# Patient Record
Sex: Female | Born: 1950
Health system: Southern US, Community
[De-identification: ages and names within clinical notes are randomized; demographics above are authoritative.]

## PROBLEM LIST (undated history)

## (undated) DIAGNOSIS — K529 Noninfective gastroenteritis and colitis, unspecified: Secondary | ICD-10-CM

## (undated) DIAGNOSIS — I499 Cardiac arrhythmia, unspecified: Secondary | ICD-10-CM

## (undated) DIAGNOSIS — Z720 Tobacco use: Secondary | ICD-10-CM

## (undated) DIAGNOSIS — I6523 Occlusion and stenosis of bilateral carotid arteries: Secondary | ICD-10-CM

## (undated) DIAGNOSIS — M1990 Unspecified osteoarthritis, unspecified site: Secondary | ICD-10-CM

## (undated) DIAGNOSIS — I351 Nonrheumatic aortic (valve) insufficiency: Secondary | ICD-10-CM

## (undated) DIAGNOSIS — R55 Syncope and collapse: Secondary | ICD-10-CM

## (undated) DIAGNOSIS — D649 Anemia, unspecified: Secondary | ICD-10-CM

## (undated) DIAGNOSIS — I4891 Unspecified atrial fibrillation: Secondary | ICD-10-CM

## (undated) DIAGNOSIS — F419 Anxiety disorder, unspecified: Secondary | ICD-10-CM

## (undated) DIAGNOSIS — R011 Cardiac murmur, unspecified: Secondary | ICD-10-CM

## (undated) DIAGNOSIS — F329 Major depressive disorder, single episode, unspecified: Secondary | ICD-10-CM

## (undated) DIAGNOSIS — G4733 Obstructive sleep apnea (adult) (pediatric): Secondary | ICD-10-CM

## (undated) DIAGNOSIS — E7800 Pure hypercholesterolemia, unspecified: Secondary | ICD-10-CM

## (undated) DIAGNOSIS — B1920 Unspecified viral hepatitis C without hepatic coma: Secondary | ICD-10-CM

## (undated) DIAGNOSIS — J302 Other seasonal allergic rhinitis: Secondary | ICD-10-CM

## (undated) DIAGNOSIS — Z8619 Personal history of other infectious and parasitic diseases: Secondary | ICD-10-CM

## (undated) DIAGNOSIS — G4710 Hypersomnia, unspecified: Secondary | ICD-10-CM

## (undated) DIAGNOSIS — F32A Depression, unspecified: Secondary | ICD-10-CM

## (undated) DIAGNOSIS — I10 Essential (primary) hypertension: Secondary | ICD-10-CM

## (undated) DIAGNOSIS — E559 Vitamin D deficiency, unspecified: Secondary | ICD-10-CM

## (undated) DIAGNOSIS — M25511 Pain in right shoulder: Secondary | ICD-10-CM

## (undated) HISTORY — DX: Essential (primary) hypertension: I10

## (undated) HISTORY — DX: Unspecified viral hepatitis C without hepatic coma: B19.20

## (undated) HISTORY — DX: Nonrheumatic aortic (valve) insufficiency: I35.1

## (undated) HISTORY — DX: Other seasonal allergic rhinitis: J30.2

## (undated) HISTORY — DX: Personal history of other infectious and parasitic diseases: Z86.19

## (undated) HISTORY — DX: Pure hypercholesterolemia, unspecified: E78.00

## (undated) HISTORY — DX: Pain in right shoulder: M25.511

## (undated) HISTORY — DX: Hypersomnia, unspecified: G47.10

## (undated) HISTORY — DX: Vitamin D deficiency, unspecified: E55.9

## (undated) HISTORY — DX: Tobacco use: Z72.0

## (undated) HISTORY — DX: Obstructive sleep apnea (adult) (pediatric): G47.33

## (undated) HISTORY — DX: Occlusion and stenosis of bilateral carotid arteries: I65.23

## (undated) HISTORY — DX: Anxiety disorder, unspecified: F41.9

## (undated) HISTORY — DX: Noninfective gastroenteritis and colitis, unspecified: K52.9

## (undated) HISTORY — DX: Unspecified atrial fibrillation: I48.91

## (undated) HISTORY — DX: Major depressive disorder, single episode, unspecified: F32.9

## (undated) HISTORY — DX: Syncope and collapse: R55

## (undated) HISTORY — DX: Depression, unspecified: F32.A

---

## 1998-09-06 ENCOUNTER — Ambulatory Visit (HOSPITAL_COMMUNITY): Admission: RE | Admit: 1998-09-06 | Discharge: 1998-09-06 | Payer: Self-pay | Admitting: Family Medicine

## 1999-10-07 HISTORY — PX: BREAST REDUCTION SURGERY: SHX8

## 1999-11-01 ENCOUNTER — Encounter: Admission: RE | Admit: 1999-11-01 | Discharge: 1999-11-01 | Payer: Self-pay | Admitting: Specialist

## 1999-11-01 ENCOUNTER — Encounter: Payer: Self-pay | Admitting: Specialist

## 1999-11-04 ENCOUNTER — Ambulatory Visit (HOSPITAL_BASED_OUTPATIENT_CLINIC_OR_DEPARTMENT_OTHER): Admission: RE | Admit: 1999-11-04 | Discharge: 1999-11-05 | Payer: Self-pay | Admitting: Specialist

## 2000-09-10 ENCOUNTER — Other Ambulatory Visit: Admission: RE | Admit: 2000-09-10 | Discharge: 2000-09-10 | Payer: Self-pay | Admitting: Family Medicine

## 2001-12-02 ENCOUNTER — Other Ambulatory Visit: Admission: RE | Admit: 2001-12-02 | Discharge: 2001-12-02 | Payer: Self-pay | Admitting: Family Medicine

## 2001-12-09 ENCOUNTER — Encounter: Payer: Self-pay | Admitting: Family Medicine

## 2001-12-09 ENCOUNTER — Encounter: Admission: RE | Admit: 2001-12-09 | Discharge: 2001-12-09 | Payer: Self-pay | Admitting: Family Medicine

## 2002-12-23 ENCOUNTER — Encounter: Payer: Self-pay | Admitting: Family Medicine

## 2002-12-23 ENCOUNTER — Encounter: Admission: RE | Admit: 2002-12-23 | Discharge: 2002-12-23 | Payer: Self-pay | Admitting: Family Medicine

## 2003-02-02 ENCOUNTER — Other Ambulatory Visit: Admission: RE | Admit: 2003-02-02 | Discharge: 2003-02-02 | Payer: Self-pay | Admitting: Family Medicine

## 2004-04-12 ENCOUNTER — Other Ambulatory Visit: Admission: RE | Admit: 2004-04-12 | Discharge: 2004-04-12 | Payer: Self-pay | Admitting: Family Medicine

## 2005-06-17 ENCOUNTER — Encounter: Admission: RE | Admit: 2005-06-17 | Discharge: 2005-06-17 | Payer: Self-pay | Admitting: Family Medicine

## 2005-07-17 ENCOUNTER — Other Ambulatory Visit: Admission: RE | Admit: 2005-07-17 | Discharge: 2005-07-17 | Payer: Self-pay | Admitting: Family Medicine

## 2005-08-07 ENCOUNTER — Encounter: Admission: RE | Admit: 2005-08-07 | Discharge: 2005-08-07 | Payer: Self-pay | Admitting: Family Medicine

## 2006-09-17 ENCOUNTER — Other Ambulatory Visit: Admission: RE | Admit: 2006-09-17 | Discharge: 2006-09-17 | Payer: Self-pay | Admitting: Family Medicine

## 2006-10-08 ENCOUNTER — Encounter: Admission: RE | Admit: 2006-10-08 | Discharge: 2006-10-08 | Payer: Self-pay | Admitting: Family Medicine

## 2007-12-24 ENCOUNTER — Other Ambulatory Visit: Admission: RE | Admit: 2007-12-24 | Discharge: 2007-12-24 | Payer: Self-pay | Admitting: Family Medicine

## 2008-02-17 ENCOUNTER — Ambulatory Visit: Payer: Self-pay | Admitting: Gastroenterology

## 2008-03-02 ENCOUNTER — Ambulatory Visit: Payer: Self-pay | Admitting: Gastroenterology

## 2008-03-02 ENCOUNTER — Encounter: Payer: Self-pay | Admitting: Gastroenterology

## 2008-09-14 ENCOUNTER — Encounter: Admission: RE | Admit: 2008-09-14 | Discharge: 2008-09-14 | Payer: Self-pay | Admitting: Family Medicine

## 2009-03-22 ENCOUNTER — Other Ambulatory Visit: Admission: RE | Admit: 2009-03-22 | Discharge: 2009-03-22 | Payer: Self-pay | Admitting: Family Medicine

## 2010-10-02 ENCOUNTER — Emergency Department (HOSPITAL_COMMUNITY): Admission: EM | Admit: 2010-10-02 | Discharge: 2010-10-03 | Payer: Self-pay | Admitting: Emergency Medicine

## 2011-01-18 LAB — RAPID URINE DRUG SCREEN, HOSP PERFORMED
Amphetamines: NOT DETECTED
Barbiturates: NOT DETECTED
Benzodiazepines: NOT DETECTED
Cocaine: NOT DETECTED
Tetrahydrocannabinol: NOT DETECTED

## 2011-01-18 LAB — SALICYLATE LEVEL: Salicylate Lvl: <4 mg/dL (ref 2.8–20.0)

## 2011-01-18 LAB — ETHANOL: Alcohol, Ethyl (B): 141 mg/dL — ABNORMAL HIGH (ref 0–10)

## 2011-01-18 LAB — COMPREHENSIVE METABOLIC PANEL
Albumin: 3.6 g/dL (ref 3.5–5.2)
BUN: 9 mg/dL (ref 6–23)
Sodium: 142 mEq/L (ref 135–145)

## 2011-01-18 LAB — TRICYCLICS SCREEN, URINE: TCA Scrn: NOT DETECTED

## 2011-01-18 LAB — CBC
Hemoglobin: 12.7 g/dL (ref 12.0–15.0)
MCH: 34 pg (ref 26.0–34.0)
WBC: 7.6 10*3/uL (ref 4.0–10.5)

## 2011-03-24 NOTE — Op Note (Signed)
[facility]  Patient:    Theresa Reynolds                       MRN: [id number] Proc. Date: 11/04/99 Adm. Date:  [id number] Attending:  Gustavus Messing CC:         Yaakov Guthrie. Shon Hough, M.D. (2 copies)                           Operative Report  BRIEF HISTORY:  This is a 60 year old lady with severe macromastia and back and  shoulder pain secondary to a large pendulous breasts, and has accessory breast tissue on the latissimus dorsi axillary areas as well.  PROCEDURES DONE:  Bilateral breast reductions using the inferior pedicle technique and excision of accessory breast tissue.  SURGEON:  Yaakov Guthrie. Shon Hough, M.D.  ANESTHESIA:  General.  DESCRIPTION OF PROCEDURE:  Preoperatively, the patient had been drawn for the inferior pedicle reduction mammoplasty remarking of the nipple areolar complexes to 20 cm from the suprasternal notch.  After this, she underwent general anesthesia, intubated orally, and prep was done to the chest and breast areas in a routine fashion using Betadine soap and solution and walled off with sterile towels and  draped as so as to make a sterile field.  The tissues were injected with 0.25% Xylocaine with epinephrine 1:400,000 concentration; total of 100 cc per side. he wounds were scored with a #15 blade, and the skin over the inferior pedicle was  de-epithelialized with a #20 blade.  After this, the medial and lateral fatty dermal pedicles were excised down to the underlying pectoralis major fascia. Hemostasis was maintained with the Bovie unit and coagulation.  After this, out  laterally, more tissue was removed using sharp dissection with the Bovie unit, removing accessory breast tissue in the regions.  The new keyhole area was debulked.  The fascia was then transposed and stayed with 3-0 Prolene. Subcutaneous closure was done with 3-0 Monocryl x 2 layers, and then the running subcuticular stitch of 3-0  Monocryl and 5-0 Monocryl throughout the inverted T. The wounds were drained with a #10 Blake drain, fully ______ , which was placed in he depths of the wound, brought out through the lateral-most portion of the incisions, and secured with 3-0 Prolene.  The wound were cleansed.  Steri-Strips and soft dressings were applied to all the areas.  She withstood the procedure very well and was taken to the recovery room in good condition.  We removed over 500 gm from his lady who was only approximately 5 feet 1 inches. DD:  11/04/99 TD:  11/05/99 Job: [id number]

## 2011-05-31 ENCOUNTER — Emergency Department (HOSPITAL_COMMUNITY)
Admission: EM | Admit: 2011-05-31 | Discharge: 2011-05-31 | Disposition: A | Discharge status: Home / Self Care | Payer: BC Managed Care – PPO | Attending: Emergency Medicine | Admitting: Emergency Medicine

## 2011-05-31 DIAGNOSIS — Z8619 Personal history of other infectious and parasitic diseases: Secondary | ICD-10-CM | POA: Insufficient documentation

## 2011-05-31 DIAGNOSIS — I10 Essential (primary) hypertension: Secondary | ICD-10-CM | POA: Insufficient documentation

## 2011-05-31 DIAGNOSIS — Z046 Encounter for general psychiatric examination, requested by authority: Secondary | ICD-10-CM | POA: Insufficient documentation

## 2011-05-31 DIAGNOSIS — IMO0002 Reserved for concepts with insufficient information to code with codable children: Secondary | ICD-10-CM | POA: Insufficient documentation

## 2011-05-31 LAB — BASIC METABOLIC PANEL
BUN: 16 mg/dL (ref 6–23)
CO2: 25 mEq/L (ref 19–32)
Chloride: 98 mEq/L (ref 96–112)
Glucose, Bld: 93 mg/dL (ref 70–99)
Potassium: 4 mEq/L (ref 3.5–5.1)
Sodium: 134 mEq/L — ABNORMAL LOW (ref 135–145)

## 2011-05-31 LAB — DIFFERENTIAL
Basophils Absolute: 0 10*3/uL (ref 0.0–0.1)
Basophils Relative: 0 % (ref 0–1)
Lymphocytes Relative: 34 % (ref 12–46)
Monocytes Absolute: 0.5 10*3/uL (ref 0.1–1.0)
Neutrophils Relative %: 58 % (ref 43–77)

## 2011-05-31 LAB — ETHANOL: Alcohol, Ethyl (B): 143 mg/dL — ABNORMAL HIGH (ref 0–11)

## 2011-05-31 LAB — RAPID URINE DRUG SCREEN, HOSP PERFORMED: Benzodiazepines: NOT DETECTED

## 2011-05-31 LAB — CBC
Platelets: 339 10*3/uL (ref 150–400)
RDW: 13.2 % (ref 11.5–15.5)
WBC: 10.7 10*3/uL — ABNORMAL HIGH (ref 4.0–10.5)

## 2011-07-24 ENCOUNTER — Other Ambulatory Visit: Payer: Self-pay | Admitting: Family Medicine

## 2011-07-24 ENCOUNTER — Other Ambulatory Visit (HOSPITAL_COMMUNITY)
Admission: RE | Admit: 2011-07-24 | Discharge: 2011-07-24 | Disposition: A | Discharge status: Home / Self Care | Payer: BC Managed Care – PPO | Source: Ambulatory Visit | Attending: Family Medicine | Admitting: Family Medicine

## 2011-07-24 DIAGNOSIS — Z124 Encounter for screening for malignant neoplasm of cervix: Secondary | ICD-10-CM | POA: Insufficient documentation

## 2011-07-24 DIAGNOSIS — Z1159 Encounter for screening for other viral diseases: Secondary | ICD-10-CM | POA: Insufficient documentation

## 2014-12-27 ENCOUNTER — Ambulatory Visit (INDEPENDENT_AMBULATORY_CARE_PROVIDER_SITE_OTHER): Payer: BLUE CROSS/BLUE SHIELD

## 2014-12-27 ENCOUNTER — Ambulatory Visit (INDEPENDENT_AMBULATORY_CARE_PROVIDER_SITE_OTHER): Payer: BLUE CROSS/BLUE SHIELD | Admitting: Emergency Medicine

## 2014-12-27 VITALS — BP 130/86 | HR 145 | Temp 98.7°F | Resp 18 | Ht 62.0 in | Wt 169.0 lb

## 2014-12-27 DIAGNOSIS — R002 Palpitations: Secondary | ICD-10-CM

## 2014-12-27 DIAGNOSIS — M542 Cervicalgia: Secondary | ICD-10-CM

## 2014-12-27 LAB — TSH: TSH: 3.098 u[IU]/mL (ref 0.350–4.500)

## 2014-12-27 LAB — POCT CBC
Granulocyte percent: 72.4 %G (ref 37–80)
HEMATOCRIT: 42.1 % (ref 37.7–47.9)
Hemoglobin: 14.1 g/dL (ref 12.2–16.2)
Lymph, poc: 2.7 (ref 0.6–3.4)
MCH, POC: 33.5 pg — AB (ref 27–31.2)
MCHC: 33.4 g/dL (ref 31.8–35.4)
MCV: 100.4 fL — AB (ref 80–97)
MID (cbc): 0.2 (ref 0–0.9)
MPV: 8.3 fL (ref 0–99.8)
PLATELET COUNT, POC: 293 10*3/uL (ref 142–424)
POC GRANULOCYTE: 7.6 — AB (ref 2–6.9)
POC LYMPH %: 25.9 % (ref 10–50)
POC MID %: 1.7 % (ref 0–12)
RBC: 4.2 M/uL (ref 4.04–5.48)
RDW, POC: 13.4 %
WBC: 10.5 10*3/uL — AB (ref 4.6–10.2)

## 2014-12-27 LAB — T4, FREE: Free T4: 0.84 ng/dL (ref 0.80–1.80)

## 2014-12-27 MED ORDER — HYDROCODONE-ACETAMINOPHEN 5-325 MG PO TABS: 1.0000 | ORAL_TABLET | Freq: Four times a day (QID) | ORAL | Status: DC | PRN

## 2014-12-27 MED ORDER — PREDNISONE 20 MG PO TABS: ORAL_TABLET | ORAL | Status: DC

## 2014-12-27 NOTE — Progress Notes (Addendum)
   This chart was scribed for Darlyne Russian, MD by Edison Simon, ED Scribe. This patient was seen in room 14 and the patient's care was started at 9:01 AM.   Subjective:    Patient ID: [name], female    DOB: October 18, 1951, 64 y.o.   MRN: [id number]  HPI  HPI Comments: FREDERIKA HUKILL is a 64 y.o. female who presents to the [facility] complaining of neck pain radiating to left shoulder and at times to her back. She has worked doing hair for 43 years and states she has had this problem intermittently but resolved spontaneously, but states it has been persistent this time for over 1 month. She also notes history of bursitis and bone spurs in shoulder. She states pain makes it difficult to sleep and move her neck. She states she has not had prior imaging of her neck. She states she sometimes feels her heart beat quickly. She reports recent stress from her brother in [place] being hospitalized for atrial fibrillation. She denies known history of thyroid problems. She states she has used icy hot and kinesiology tape without remission.   Review of Systems  Cardiovascular: Positive for palpitations.  Musculoskeletal: Positive for back pain and neck pain.  Psychiatric/Behavioral: Positive for sleep disturbance.  All other systems reviewed and are negative.      Objective:   Physical Exam  Nursing note and vitals reviewed.  CONSTITUTIONAL: Well developed/well nourished HEAD: Normocephalic/atraumatic EYES: EOMI/PERRL ENMT: Mucous membranes moist NECK: supple no meningeal signs, limited ROM to the right and to the left, pain with extension of her neck, positive Spurlings SPINE/BACK:entire spine nontender CV: S1/S2 noted, no murmurs/rubs/gallops noted, tachycardic LUNGS: Lungs are clear to auscultation bilaterally, no apparent distress ABDOMEN: soft, nontender, no rebound or guarding, bowel sounds noted throughout abdomen GU:no cva tenderness NEURO: Pt is  awake/alert/appropriate, moves all extremitiesx4.  No facial droop.   EXTREMITIES: pulses normal/equal, full ROM, reflexes and strength of upper extremities are normal SKIN: warm, color normal PSYCH: no abnormalities of mood noted, alert and oriented to situation   [facility] (PRIMARY) x-ray report read by Dr. Everlene Farrier: cervical spine x-ray: she has severe multilevel degenerative disc disease, there is a large spur at C4, C5 vertebrae appears sclerotic with partial collapse, degenerative disc disease at C5 C6 C7 , there appears to be some retrolisthesis of C5     Assessment & Plan:  Patient placed on prednisone in a taper dose along with hydrocodone for severe pain. t. Will schedule MRI of the C-spine. She initially was tachycardic but at the time of her EKG her heart rate was down to 82 at the time of her EKG. Thyroid studies were drawn. I personally performed the services described in this documentation, which was scribed in my presence. The recorded information has been reviewed and is accurate.

## 2015-01-04 ENCOUNTER — Telehealth: Payer: Self-pay

## 2015-01-04 ENCOUNTER — Other Ambulatory Visit: Payer: Self-pay | Admitting: Emergency Medicine

## 2015-01-04 MED ORDER — METHOCARBAMOL 500 MG PO TABS: 500.0000 mg | ORAL_TABLET | Freq: Four times a day (QID) | ORAL | Status: DC | PRN

## 2015-01-04 MED ORDER — HYDROCODONE-ACETAMINOPHEN 5-325 MG PO TABS: 1.0000 | ORAL_TABLET | Freq: Four times a day (QID) | ORAL | Status: DC | PRN

## 2015-01-04 NOTE — Telephone Encounter (Signed)
Muscle in neck pain won't let up.  Muscle relaxer might work.   [phone]

## 2015-01-04 NOTE — Telephone Encounter (Signed)
Pt called for her HYDROcodone-acetaminophen ([place]) 5-325 MG per tablet [[id number]] prescription. I found it in the stack to be called and signed in. I signed it in and placed it in the drawer.

## 2015-01-04 NOTE — Telephone Encounter (Signed)
Dr. Daub please advise. 

## 2015-01-04 NOTE — Telephone Encounter (Signed)
I sent meds in. Will try and get MRI sooner

## 2015-01-04 NOTE — Telephone Encounter (Signed)
Pt is getting a referral regarding a nerve pain she have but she is in a lot of pain now, would like to have some PREDISIONE AND HYDROCODONE Please call [phone]     [facility]

## 2015-01-05 NOTE — Telephone Encounter (Signed)
Left a detailed message letting pt know Rx is ready to pick up.

## 2015-01-18 ENCOUNTER — Ambulatory Visit
Admission: RE | Admit: 2015-01-18 | Discharge: 2015-01-18 | Disposition: A | Discharge status: Home / Self Care | Payer: BLUE CROSS/BLUE SHIELD | Source: Ambulatory Visit | Attending: Emergency Medicine | Admitting: Emergency Medicine

## 2015-01-18 DIAGNOSIS — M542 Cervicalgia: Secondary | ICD-10-CM

## 2015-01-19 ENCOUNTER — Telehealth: Payer: Self-pay | Admitting: Emergency Medicine

## 2015-01-19 ENCOUNTER — Other Ambulatory Visit: Payer: Self-pay

## 2015-01-19 ENCOUNTER — Telehealth: Payer: Self-pay

## 2015-01-19 DIAGNOSIS — R9389 Abnormal findings on diagnostic imaging of other specified body structures: Secondary | ICD-10-CM

## 2015-01-19 NOTE — Telephone Encounter (Signed)
Spoke to patient and advised her that Dr. Everlene Farrier has put in orders for blood work and referrals for neurosurgery and an MRI.  I told her we would call her with the appointments.  She said she would come in for the blood work as soon as possible.

## 2015-01-19 NOTE — Telephone Encounter (Signed)
I called and spoke with the patient. I told her I would speak with the neurosurgeon regarding next step.

## 2015-01-20 ENCOUNTER — Other Ambulatory Visit (INDEPENDENT_AMBULATORY_CARE_PROVIDER_SITE_OTHER): Payer: BLUE CROSS/BLUE SHIELD | Admitting: *Deleted

## 2015-01-20 DIAGNOSIS — R938 Abnormal findings on diagnostic imaging of other specified body structures: Secondary | ICD-10-CM | POA: Diagnosis not present

## 2015-01-20 DIAGNOSIS — R9389 Abnormal findings on diagnostic imaging of other specified body structures: Secondary | ICD-10-CM

## 2015-01-20 LAB — BASIC METABOLIC PANEL
BUN: 13 mg/dL (ref 6–23)
CO2: 23 meq/L (ref 19–32)
Calcium: 9.1 mg/dL (ref 8.4–10.5)
Chloride: 103 mEq/L (ref 96–112)
Creat: 0.75 mg/dL (ref 0.50–1.10)
GLUCOSE: 82 mg/dL (ref 70–99)
POTASSIUM: 4.8 meq/L (ref 3.5–5.3)
Sodium: 135 mEq/L (ref 135–145)

## 2015-01-20 LAB — CBC WITH DIFFERENTIAL/PLATELET
Basophils Absolute: 0 10*3/uL (ref 0.0–0.1)
Basophils Relative: 0 % (ref 0–1)
Eosinophils Absolute: 0.1 10*3/uL (ref 0.0–0.7)
Eosinophils Relative: 1 % (ref 0–5)
HCT: 37.3 % (ref 36.0–46.0)
Hemoglobin: 12.7 g/dL (ref 12.0–15.0)
Lymphocytes Relative: 31 % (ref 12–46)
Lymphs Abs: 2.6 10*3/uL (ref 0.7–4.0)
MCH: 34.1 pg — ABNORMAL HIGH (ref 26.0–34.0)
MCHC: 34 g/dL (ref 30.0–36.0)
MCV: 100.3 fL — ABNORMAL HIGH (ref 78.0–100.0)
MPV: 10.5 fL (ref 8.6–12.4)
Monocytes Absolute: 0.5 10*3/uL (ref 0.1–1.0)
Monocytes Relative: 6 % (ref 3–12)
Neutro Abs: 5.1 10*3/uL (ref 1.7–7.7)
Neutrophils Relative %: 62 % (ref 43–77)
Platelets: 341 10*3/uL (ref 150–400)
RBC: 3.72 MIL/uL — ABNORMAL LOW (ref 3.87–5.11)
RDW: 13.4 % (ref 11.5–15.5)
WBC: 8.3 10*3/uL (ref 4.0–10.5)

## 2015-01-20 LAB — C-REACTIVE PROTEIN

## 2015-01-20 NOTE — Progress Notes (Signed)
Pt here for lab draw only  

## 2015-01-21 ENCOUNTER — Encounter: Payer: Self-pay | Admitting: Family Medicine

## 2015-01-21 LAB — SEDIMENTATION RATE: Sed Rate: 8 mm/hr (ref 0–30)

## 2015-01-24 ENCOUNTER — Ambulatory Visit
Admission: RE | Admit: 2015-01-24 | Discharge: 2015-01-24 | Disposition: A | Discharge status: Home / Self Care | Payer: BLUE CROSS/BLUE SHIELD | Source: Ambulatory Visit | Attending: Emergency Medicine | Admitting: Emergency Medicine

## 2015-01-24 DIAGNOSIS — R9389 Abnormal findings on diagnostic imaging of other specified body structures: Secondary | ICD-10-CM

## 2015-01-25 NOTE — Telephone Encounter (Signed)
Dr. Everlene Farrier wanted me to call pt to see if pt had heard from neurosurgery and check to see how she is doing.  Pt was supposed to have an MRI yesterday with contrast, but they were unable to get a vein for the contrast. Pt has rescheduled MRI for tomorrow.  Pt's appt with neurosurgery is Tues April 12 Pt doing ok with muscle relaxants at night-nerve pain not nearly as bad taking these

## 2015-01-26 ENCOUNTER — Other Ambulatory Visit: Payer: Self-pay | Admitting: Emergency Medicine

## 2015-01-26 ENCOUNTER — Telehealth: Payer: Self-pay

## 2015-01-26 ENCOUNTER — Ambulatory Visit
Admission: RE | Admit: 2015-01-26 | Discharge: 2015-01-26 | Disposition: A | Discharge status: Home / Self Care | Payer: BLUE CROSS/BLUE SHIELD | Source: Ambulatory Visit | Attending: Emergency Medicine | Admitting: Emergency Medicine

## 2015-01-26 MED ORDER — GADOBENATE DIMEGLUMINE 529 MG/ML IV SOLN: 15.0000 mL | Freq: Once | INTRAVENOUS | Status: AC | PRN

## 2015-01-26 MED ORDER — HYDROCODONE-ACETAMINOPHEN 5-325 MG PO TABS: 1.0000 | ORAL_TABLET | Freq: Four times a day (QID) | ORAL | Status: DC | PRN

## 2015-01-26 NOTE — Telephone Encounter (Signed)
Pt states she hurt her neck and would like to have her HYDROCODONE refilled. Please call [phone]

## 2015-01-26 NOTE — Telephone Encounter (Signed)
Left a detailed message on voicemail giving message from Dr. Everlene Farrier. Advised pt to call if she had any questions.

## 2015-01-26 NOTE — Telephone Encounter (Signed)
Call patient and tell her I'm glad she is not suffering as bad as previously. Once we get the MRI we will know what direction to go.

## 2015-01-28 NOTE — Telephone Encounter (Signed)
Dr Everlene Farrier RFd on 3/22 and notified pt on Mychart.

## 2015-10-27 ENCOUNTER — Encounter: Payer: Self-pay | Admitting: Gastroenterology

## 2017-04-03 ENCOUNTER — Telehealth: Payer: Self-pay | Admitting: *Deleted

## 2017-04-03 NOTE — Telephone Encounter (Signed)
NOTES SENT TO SCHEDULING.  °

## 2017-04-25 ENCOUNTER — Ambulatory Visit: Payer: BLUE CROSS/BLUE SHIELD | Admitting: Cardiovascular Disease

## 2017-04-30 ENCOUNTER — Ambulatory Visit (INDEPENDENT_AMBULATORY_CARE_PROVIDER_SITE_OTHER): Payer: BLUE CROSS/BLUE SHIELD | Admitting: Internal Medicine

## 2017-04-30 ENCOUNTER — Encounter: Payer: Self-pay | Admitting: Internal Medicine

## 2017-04-30 VITALS — BP 122/78 | HR 64 | Ht 61.0 in | Wt 176.0 lb

## 2017-04-30 DIAGNOSIS — R55 Syncope and collapse: Secondary | ICD-10-CM | POA: Diagnosis not present

## 2017-04-30 DIAGNOSIS — G4733 Obstructive sleep apnea (adult) (pediatric): Secondary | ICD-10-CM | POA: Diagnosis not present

## 2017-04-30 DIAGNOSIS — I480 Paroxysmal atrial fibrillation: Secondary | ICD-10-CM | POA: Diagnosis not present

## 2017-04-30 DIAGNOSIS — I10 Essential (primary) hypertension: Secondary | ICD-10-CM

## 2017-04-30 LAB — TSH: TSH: 3.65 u[IU]/mL (ref 0.450–4.500)

## 2017-04-30 LAB — T4: T4, Total: 5.8 ug/dL (ref 4.5–12.0)

## 2017-04-30 NOTE — Patient Instructions (Addendum)
Medication Instructions:  Your physician recommends that you continue on your current medications as directed. Please refer to the Current Medication list given to you today.  Labwork: Your physician recommends that you get TSH and T4 today.   Testing/Procedures: LINQ implant on 05/07/2017  Please arrive @ [place] main entrance of [place] @ 6:30am  Follow-Up: Your physician recommends that you schedule a follow-up appointment in: 7 to 10 days from 05/07/2017 in device clinic for wound check.  Follow up in 4 weeks from 05/07/2017 with Dr. Rayann Heman.   Any Other Special Instructions Will Be Listed Below (If Applicable).     If you need a refill on your cardiac medications before your next appointment, please call your pharmacy.

## 2017-04-30 NOTE — Progress Notes (Addendum)
Electrophysiology Office Note   Date:  04/30/2017   ID:  Theresa Reynolds, DOB October 12, 1951, MRN [id number]  PCP:  [facility] [place]  Cardiologist:  Isaias Cowman PA-C/ Dr Claudie Leach Primary Electrophysiologist: Thompson Grayer, MD   Referring:  Dr Claudie Leach Reason:  She is referred for further evaluation of afib, presyncope, and syncope.  Chief Complaint  Patient presents with  . Loss of Consciousness     History of Present Illness: Theresa Reynolds is a 66 y.o. female who presents today for electrophysiology evaluation.   She is referred for further evaluation of afib, presyncope, and syncope. She reports being in good health until this past October when she was found to be in afib during colonoscopy.  She was evaluated by Dr Claudie Leach and had an event monitor placed which was mostly unrevealing (per pt).  She has had increasing frequency and duration of tachypalpitations since that time.  She was placed on metoprolol.  In May, she had abrupt onset syncope.  She has had brief dizziness, presyncope since that time but no further episodes of syncope.  She has fatigue with her afib.  Today, she denies symptoms of chest pain, shortness of breath, orthopnea, PND, lower extremity edema, claudication, bleeding, or neurologic sequela. The patient is tolerating medications without difficulties and is otherwise without complaint today.    Past Medical History:  Diagnosis Date  . Anxiety and depression    s/p suicide attempt 11/11  . Aortic insufficiency    mild by echo 10/17  . Atrial fibrillation ([place])   . Carotid stenosis, bilateral    0-39% by [place]  . Colitis   . Hepatitis C    Followed by Dr. Dallas Breeding at [facility].  s/p treatment with Bavoni.  [name] History of shingles   . HTN (hypertension)   . Hypercholesteremia   . Hypersomnia   . OSA (obstructive sleep apnea)   . Right shoulder pain   . Seasonal allergies   . Syncope and collapse   . Tobacco use   . Vitamin  D deficiency    Past Surgical History:  Procedure Laterality Date  . BREAST REDUCTION SURGERY  10/1999   With Dr. Towanda Malkin  . CESAREAN SECTION       Current Outpatient Prescriptions  Medication Sig Dispense Refill  . apixaban (ELIQUIS) 5 MG TABS tablet Take 5 mg by mouth 2 (two) times daily.    . cetirizine (ZYRTEC) 10 MG tablet Take 10 mg by mouth daily.    [name] LORazepam (ATIVAN) 1 MG tablet Take 1 mg by mouth 2 (two) times daily as needed for anxiety.    . metoprolol succinate (TOPROL-XL) 100 MG 24 hr tablet Take 100 mg by mouth daily. Take with or immediately following a meal.    . Multiple Vitamins-Minerals (MULTIVITAMIN WITH MINERALS) tablet Take 1 tablet by mouth daily.    . simvastatin (ZOCOR) 20 MG tablet Take 20 mg by mouth daily.    . traZODone (DESYREL) 50 MG tablet Take 50 mg by mouth at bedtime as needed for sleep.    [name] venlafaxine (EFFEXOR) 37.5 MG tablet Take 37.5 mg by mouth 3 (three) times daily with meals. Patient takes three tablets by mouth every morning     No current facility-administered medications for this visit.     Allergies:   Cymbalta [duloxetine hcl]   Social History:  The patient  reports that she quit smoking about a year ago. Her smoking use included Cigarettes. She  has a 30.00 pack-year smoking history. She has never used smokeless tobacco. She reports that she drinks alcohol. She reports that she does not use drugs.   Family History:  The patient's  family history includes Atrial fibrillation in her brother and mother; Heart disease in her maternal grandfather; Mitral valve prolapse in her mother.    ROS:  Please see the history of present illness.   All other systems are personally reviewed and negative.    PHYSICAL EXAM: VS:  BP 122/78 (BP Location: Right Arm)   Pulse 64   Ht 5\' 1"  (1.549 m)   Wt 176 lb (79.8 kg)   BMI 33.25 kg/m  , BMI Body mass index is 33.25 kg/m. GEN: Well nourished, well developed, in no acute distress  HEENT: normal   Neck: no JVD, carotid bruits, or masses Cardiac: RRR; no murmurs, rubs, or gallops,no edema  Respiratory:  clear to auscultation bilaterally, normal work of breathing GI: soft, nontender, nondistended, + BS MS: no deformity or atrophy  Skin: warm and dry  Neuro:  Strength and sensation are intact Psych: euthymic mood, full affect  EKG:  EKG is ordered today. The ekg ordered today is personally reviewed and shows sinus rhythm 64 bpm, PR 194 msec, biatrial enlargement   Recent Labs: No results found for requested labs within last 8760 hours.  personally reviewed   Lipid Panel  No results found for: CHOL, TRIG, HDL, CHOLHDL, VLDL, LDLCALC, LDLDIRECT personally reviewed   Wt Readings from Last 3 Encounters:  04/30/17 176 lb (79.8 kg)  12/27/14 169 lb (76.7 kg)      Other studies personally reviewed: Additional studies/ records that were reviewed today include: stress test, echo, and vascular US reviewed  Review of the above records today demonstrates: as above,  Echo 08/14/2016 reveals EF 60-65%, mild AI, PAS 42 mm Hg, LA 43mm   ASSESSMENT AND PLAN:  1.  Syncope Unclear etiology Could be due to sinus pauses/ post termintation pauses, or other. I would advise additional monitoring.  Given infrequent nature of episodes, I would advise implantable loop recorder for long term monitoring.  She has already had short term monitoring which was unrevealing.  Risks and benefits to ILR placement were discussed with the patient who wishes to proceed.  She requests next Monday for the procedure as this is her day off. No driving x 6 months from time of syncope (pt aware)  2. afib Paroxysmal by nature Will check tsh/t4 today as she does not think that this was done. Echo is reviewed ILR to further characterize her afib Continue on eliquis Could consider flecainide but I would like to further evaluate nature of pauses first  (as above)  3. HTN Stable No change required today  4.  OSA She is compliant with CPAP   Follow-up:  Return to see me in 4 weeks unless problems arise in the interim  Current medicines are reviewed at length with the patient today.   The patient does not have concerns regarding her medicines.  The following changes were made today:  none   Signed, Thompson Grayer, MD  04/30/2017 9:33 AM     [facility] [address] [place] [address] [phone] (office) [phone] (fax)

## 2017-05-07 ENCOUNTER — Encounter (HOSPITAL_COMMUNITY): Payer: Self-pay | Admitting: Internal Medicine

## 2017-05-07 ENCOUNTER — Encounter (HOSPITAL_COMMUNITY)
Admission: RE | Disposition: A | Discharge status: Home / Self Care | Payer: Self-pay | Source: Ambulatory Visit | Attending: Internal Medicine

## 2017-05-07 ENCOUNTER — Ambulatory Visit (HOSPITAL_COMMUNITY)
Admission: RE | Admit: 2017-05-07 | Discharge: 2017-05-07 | Disposition: A | Discharge status: Home / Self Care | Payer: BLUE CROSS/BLUE SHIELD | Source: Ambulatory Visit | Attending: Internal Medicine | Admitting: Internal Medicine

## 2017-05-07 DIAGNOSIS — G4733 Obstructive sleep apnea (adult) (pediatric): Secondary | ICD-10-CM | POA: Insufficient documentation

## 2017-05-07 DIAGNOSIS — Z7901 Long term (current) use of anticoagulants: Secondary | ICD-10-CM | POA: Diagnosis not present

## 2017-05-07 DIAGNOSIS — E559 Vitamin D deficiency, unspecified: Secondary | ICD-10-CM | POA: Diagnosis not present

## 2017-05-07 DIAGNOSIS — R55 Syncope and collapse: Secondary | ICD-10-CM | POA: Insufficient documentation

## 2017-05-07 DIAGNOSIS — I10 Essential (primary) hypertension: Secondary | ICD-10-CM | POA: Insufficient documentation

## 2017-05-07 DIAGNOSIS — Z8249 Family history of ischemic heart disease and other diseases of the circulatory system: Secondary | ICD-10-CM | POA: Insufficient documentation

## 2017-05-07 DIAGNOSIS — I480 Paroxysmal atrial fibrillation: Secondary | ICD-10-CM | POA: Insufficient documentation

## 2017-05-07 DIAGNOSIS — Z87891 Personal history of nicotine dependence: Secondary | ICD-10-CM | POA: Insufficient documentation

## 2017-05-07 DIAGNOSIS — F329 Major depressive disorder, single episode, unspecified: Secondary | ICD-10-CM | POA: Diagnosis not present

## 2017-05-07 DIAGNOSIS — F419 Anxiety disorder, unspecified: Secondary | ICD-10-CM | POA: Insufficient documentation

## 2017-05-07 DIAGNOSIS — B1920 Unspecified viral hepatitis C without hepatic coma: Secondary | ICD-10-CM | POA: Insufficient documentation

## 2017-05-07 DIAGNOSIS — E7800 Pure hypercholesterolemia, unspecified: Secondary | ICD-10-CM | POA: Diagnosis not present

## 2017-05-07 DIAGNOSIS — I6523 Occlusion and stenosis of bilateral carotid arteries: Secondary | ICD-10-CM | POA: Diagnosis not present

## 2017-05-07 HISTORY — PX: LOOP RECORDER INSERTION: EP1214

## 2017-05-07 SURGERY — LOOP RECORDER INSERTION

## 2017-05-07 MED ORDER — LIDOCAINE HCL (PF) 1 % IJ SOLN
INTRAMUSCULAR | Status: AC
  Filled 2017-05-07: qty 30

## 2017-05-07 MED ORDER — LIDOCAINE HCL (PF) 1 % IJ SOLN
INTRAMUSCULAR | Status: DC | PRN
Start: 2017-05-07 — End: 2017-05-07
  Administered 2017-05-07: 10 mL

## 2017-05-07 SURGICAL SUPPLY — 2 items
LOOP REVEAL LINQSYS (Prosthesis & Implant Heart) ×2 IMPLANT
PACK LOOP INSERTION (CUSTOM PROCEDURE TRAY) ×2 IMPLANT

## 2017-05-07 NOTE — Interval H&P Note (Signed)
History and Physical Interval Note:  05/07/2017 7:45 AM  Theresa Reynolds  has presented today for surgery, with the diagnosis of syncope  The various methods of treatment have been discussed with the patient and family. After consideration of risks, benefits and other options for treatment, the patient has consented to  Procedure(s): Loop Recorder Insertion (N/A) as a surgical intervention .  The patient's history has been reviewed, patient examined, no change in status, stable for surgery.  I have reviewed the patient's chart and labs.  Questions were answered to the patient's satisfaction.     Thompson Grayer

## 2017-05-07 NOTE — H&P (View-Only) (Signed)
Electrophysiology Office Note   Date:  04/30/2017   ID:  Theresa Reynolds, DOB 1951/01/04, MRN [id number]  PCP:  [facility] [place]  Cardiologist:  Isaias Cowman PA-C/ Dr Claudie Leach Primary Electrophysiologist: Thompson Grayer, MD   Referring:  Dr Claudie Leach Reason:  She is referred for further evaluation of afib, presyncope, and syncope.  Chief Complaint  Patient presents with  . Loss of Consciousness     History of Present Illness: Theresa Reynolds is a 66 y.o. female who presents today for electrophysiology evaluation.   She is referred for further evaluation of afib, presyncope, and syncope. She reports being in good health until this past October when she was found to be in afib during colonoscopy.  She was evaluated by Dr Claudie Leach and had an event monitor placed which was mostly unrevealing (per pt).  She has had increasing frequency and duration of tachypalpitations since that time.  She was placed on metoprolol.  In May, she had abrupt onset syncope.  She has had brief dizziness, presyncope since that time but no further episodes of syncope.  She has fatigue with her afib.  Today, she denies symptoms of chest pain, shortness of breath, orthopnea, PND, lower extremity edema, claudication, bleeding, or neurologic sequela. The patient is tolerating medications without difficulties and is otherwise without complaint today.    Past Medical History:  Diagnosis Date  . Anxiety and depression    s/p suicide attempt 11/11  . Aortic insufficiency    mild by echo 10/17  . Atrial fibrillation ([place])   . Carotid stenosis, bilateral    0-39% by [place]  . Colitis   . Hepatitis C    Followed by Dr. Dallas Breeding at [facility].  s/p treatment with Bavoni.  [name] History of shingles   . HTN (hypertension)   . Hypercholesteremia   . Hypersomnia   . OSA (obstructive sleep apnea)   . Right shoulder pain   . Seasonal allergies   . Syncope and collapse   . Tobacco use   . Vitamin  D deficiency    Past Surgical History:  Procedure Laterality Date  . BREAST REDUCTION SURGERY  10/1999   With Dr. Towanda Malkin  . CESAREAN SECTION       Current Outpatient Prescriptions  Medication Sig Dispense Refill  . apixaban (ELIQUIS) 5 MG TABS tablet Take 5 mg by mouth 2 (two) times daily.    . cetirizine (ZYRTEC) 10 MG tablet Take 10 mg by mouth daily.    [name] LORazepam (ATIVAN) 1 MG tablet Take 1 mg by mouth 2 (two) times daily as needed for anxiety.    . metoprolol succinate (TOPROL-XL) 100 MG 24 hr tablet Take 100 mg by mouth daily. Take with or immediately following a meal.    . Multiple Vitamins-Minerals (MULTIVITAMIN WITH MINERALS) tablet Take 1 tablet by mouth daily.    . simvastatin (ZOCOR) 20 MG tablet Take 20 mg by mouth daily.    . traZODone (DESYREL) 50 MG tablet Take 50 mg by mouth at bedtime as needed for sleep.    [name] venlafaxine (EFFEXOR) 37.5 MG tablet Take 37.5 mg by mouth 3 (three) times daily with meals. Patient takes three tablets by mouth every morning     No current facility-administered medications for this visit.     Allergies:   Cymbalta [duloxetine hcl]   Social History:  The patient  reports that she quit smoking about a year ago. Her smoking use included Cigarettes. She  has a 30.00 pack-year smoking history. She has never used smokeless tobacco. She reports that she drinks alcohol. She reports that she does not use drugs.   Family History:  The patient's  family history includes Atrial fibrillation in her brother and mother; Heart disease in her maternal grandfather; Mitral valve prolapse in her mother.    ROS:  Please see the history of present illness.   All other systems are personally reviewed and negative.    PHYSICAL EXAM: VS:  BP 122/78 (BP Location: Right Arm)   Pulse 64   Ht 5\' 1"  (1.549 m)   Wt 176 lb (79.8 kg)   BMI 33.25 kg/m  , BMI Body mass index is 33.25 kg/m. GEN: Well nourished, well developed, in no acute distress  HEENT: normal   Neck: no JVD, carotid bruits, or masses Cardiac: RRR; no murmurs, rubs, or gallops,no edema  Respiratory:  clear to auscultation bilaterally, normal work of breathing GI: soft, nontender, nondistended, + BS MS: no deformity or atrophy  Skin: warm and dry  Neuro:  Strength and sensation are intact Psych: euthymic mood, full affect  EKG:  EKG is ordered today. The ekg ordered today is personally reviewed and shows sinus rhythm 64 bpm, PR 194 msec, biatrial enlargement   Recent Labs: No results found for requested labs within last 8760 hours.  personally reviewed   Lipid Panel  No results found for: CHOL, TRIG, HDL, CHOLHDL, VLDL, LDLCALC, LDLDIRECT personally reviewed   Wt Readings from Last 3 Encounters:  04/30/17 176 lb (79.8 kg)  12/27/14 169 lb (76.7 kg)      Other studies personally reviewed: Additional studies/ records that were reviewed today include: stress test, echo, and vascular US reviewed  Review of the above records today demonstrates: as above,  Echo 08/14/2016 reveals EF 60-65%, mild AI, PAS 42 mm Hg, LA 68mm   ASSESSMENT AND PLAN:  1.  Syncope Unclear etiology Could be due to sinus pauses/ post termintation pauses, or other. I would advise additional monitoring.  Given infrequent nature of episodes, I would advise implantable loop recorder for long term monitoring.  She has already had short term monitoring which was unrevealing.  Risks and benefits to ILR placement were discussed with the patient who wishes to proceed.  She requests next Monday for the procedure as this is her day off. No driving x 6 months from time of syncope (pt aware)  2. afib Paroxysmal by nature Will check tsh/t4 today as she does not think that this was done. Echo is reviewed ILR to further characterize her afib Continue on eliquis Could consider flecainide but I would like to further evaluate nature of pauses first  (as above)  3. HTN Stable No change required today  4.  OSA She is compliant with CPAP   Follow-up:  Return to see me in 4 weeks unless problems arise in the interim  Current medicines are reviewed at length with the patient today.   The patient does not have concerns regarding her medicines.  The following changes were made today:  none   Signed, Thompson Grayer, MD  04/30/2017 9:33 AM     [facility] [address] [place] [address] [phone] (office) [phone] (fax)

## 2017-05-07 NOTE — Progress Notes (Signed)
Discharge instructions reviewed with pt, understanding voiced.  Copies given.  Discharged with LINQ monitor and follow up appointment already scheduled.

## 2017-05-10 ENCOUNTER — Telehealth: Payer: Self-pay

## 2017-05-10 NOTE — Telephone Encounter (Signed)
Spoke with pt, she agreed to appointment on 05/14/17 at 2:45, pt voiced understanding that if she passes out before appointment on Monday that she should go to the ER.

## 2017-05-10 NOTE — Telephone Encounter (Signed)
Spoke with Theresa Reynolds regarding episode from 7/4 at 16:37 she stated that she got real dizzy but did not pass out, informed her that I would review with a practitioner and give her a call back. Pt voiced understanding

## 2017-05-14 ENCOUNTER — Ambulatory Visit (INDEPENDENT_AMBULATORY_CARE_PROVIDER_SITE_OTHER): Payer: BLUE CROSS/BLUE SHIELD | Admitting: Internal Medicine

## 2017-05-14 ENCOUNTER — Encounter: Payer: Self-pay | Admitting: Internal Medicine

## 2017-05-14 VITALS — BP 122/76 | HR 66 | Ht 61.0 in | Wt 175.0 lb

## 2017-05-14 DIAGNOSIS — I480 Paroxysmal atrial fibrillation: Secondary | ICD-10-CM

## 2017-05-14 DIAGNOSIS — G4733 Obstructive sleep apnea (adult) (pediatric): Secondary | ICD-10-CM

## 2017-05-14 DIAGNOSIS — R001 Bradycardia, unspecified: Secondary | ICD-10-CM

## 2017-05-14 LAB — CUP PACEART INCLINIC DEVICE CHECK
MDC IDC PG IMPLANT DT: 20180702
MDC IDC SESS DTM: 20180709153859

## 2017-05-14 NOTE — Patient Instructions (Signed)
Medication Instructions:  Your physician has recommended you make the following change in your medication:  1) Decrease Toprol to 50 mg daily   Labwork: Your physician recommends that you return for lab work on 05/22/17   Testing/Procedures: Non-Cardiac CT scanning, (CAT scanning), is a noninvasive, special x-ray that produces cross-sectional images of the body using x-rays and a computer. CT scans help physicians diagnose and treat medical conditions. For some CT exams, a contrast material is used to enhance visibility in the area of the body being studied. CT scans provide greater clarity and reveal more details than regular x-ray exams.----week of 05/28/17.  The office will call with date and time once approved by insurance.  Your physician has recommended that you have an ablation. Catheter ablation is a medical procedure used to treat some cardiac arrhythmias (irregular heartbeats). During catheter ablation, a long, thin, flexible tube is put into a blood vessel in your groin (upper thigh), or neck. This tube is called an ablation catheter. It is then guided to your heart through the blood vessel. Radio frequency waves destroy small areas of heart tissue where abnormal heartbeats may cause an arrhythmia to start. Please see the instruction sheet given to you today.---06/05/17  Please arrive at [facility] at 8:30am Do not eat or drink after midnight the night prior to the procedure Do not take any medications the morning of the test Plan for one night stay Will need someone to drive you home at discharge     Follow-Up: Your physician recommends that you schedule a follow-up appointment in: 4 weeks from 06/05/17 with Afib clinic and 3 months from 06/05/17 with Dr Rayann Heman   Any Other Special Instructions Will Be Listed Below (If Applicable).     If you need a refill on your cardiac medications before your next appointment, please call your  pharmacy.

## 2017-05-14 NOTE — Progress Notes (Signed)
PCP: [facility] [place] [facility] Cardiologist: Dr Claudie Leach Roque Cash PA-C  Theresa Reynolds is a 66 y.o. female who presents today for routine electrophysiology followup. She continues to have afib with termination presyncope.  ILR recently placed has documented post termination pauses with her afib of up to 7 seconds.  She has not had full syncope.  Today, she denies symptoms of palpitations, chest pain, shortness of breath,  lower extremity edema , or syncope.  The patient is otherwise without complaint today.   Past Medical History:  Diagnosis Date  . Anxiety and depression    s/p suicide attempt 11/11  . Aortic insufficiency    mild by echo 10/17  . Atrial fibrillation ([place])   . Carotid stenosis, bilateral    0-39% by [place]  . Colitis   . Hepatitis C    Followed by Dr. Dallas Breeding at [facility].  s/p treatment with Bavoni.  [name] History of shingles   . HTN (hypertension)   . Hypercholesteremia   . Hypersomnia   . OSA (obstructive sleep apnea)   . Right shoulder pain   . Seasonal allergies   . Syncope and collapse   . Tobacco use   . Vitamin D deficiency    Past Surgical History:  Procedure Laterality Date  . BREAST REDUCTION SURGERY  10/1999   With Dr. Towanda Malkin  . CESAREAN SECTION    . LOOP RECORDER INSERTION N/A 05/07/2017   Procedure: Loop Recorder Insertion;  Surgeon: Thompson Grayer, MD;  Location: [facility];  Service: Cardiovascular;  Laterality: N/A;    ROS- all systems are reviewed and negatives except as per HPI above  Current Outpatient Prescriptions  Medication Sig Dispense Refill  . apixaban (ELIQUIS) 5 MG TABS tablet Take 5 mg by mouth 2 (two) times daily.    . cetirizine (ZYRTEC) 10 MG tablet Take 10 mg by mouth daily as needed for allergies.     . fluticasone (FLONASE) 50 MCG/ACT nasal spray Place 2 sprays into both nostrils daily as needed for allergies or rhinitis.    [name] LORazepam (ATIVAN) 1 MG tablet Take 0.5-1 mg by  mouth daily as needed for anxiety.     . metoprolol succinate (TOPROL-XL) 100 MG 24 hr tablet Take 100 mg by mouth daily. Take with or immediately following a meal.    . Multiple Vitamins-Minerals (EMERGEN-C IMMUNE) PACK Take 1 packet by mouth daily as needed (immune support).     . simvastatin (ZOCOR) 20 MG tablet Take 20 mg by mouth at bedtime.     . Tetrahydrozoline HCl (VISINE OP) Apply 1 drop to eye daily as needed (allergies).    . traZODone (DESYREL) 50 MG tablet Take 50 mg by mouth at bedtime.     [name] venlafaxine XR (EFFEXOR-XR) 37.5 MG 24 hr capsule Take 112.5 mg by mouth daily.   0   No current facility-administered medications for this visit.     Physical Exam: Vitals:   05/14/17 1503  BP: 122/76  Pulse: 66  SpO2: 94%  Weight: 175 lb (79.4 kg)  Height: 5\' 1"  (1.549 m)    GEN- The patient is well appearing, alert and oriented x 3 today.   Head- normocephalic, atraumatic Eyes-  Sclera clear, conjunctiva pink Ears- hearing intact Oropharynx- clear Lungs- Clear to ausculation bilaterally, normal work of breathing Heart- Regular rate and rhythm, no murmurs, rubs or gallops, PMI not laterally displaced GI- soft, NT, ND, + BS Extremities- no clubbing, cyanosis,  or edema  ILR interrogation ordered today is personally reviewed and shows afib and atrial flutter with post termination pauses of 4-7 seconds Echo is reviewed  Assessment and Plan:  1. Paroxysmal atrial fibrillation/ atrial flutter/ post termination pauses with presyncope The patient has ongoing atrial arrhythmias.  Medical therapy is limited by pauses.  I have reduced toprol to 50mg  daily today.  This is not a long term solution due to afib with RVR episodes.  I have advised PPM implantation to prevent pauses.  She would also be a candidate for Micra.  At this time, she is clear that she would like to avoid pacing.  I have therefore offered afib ablation as a secondary option.  I do believe that if we can resolve her  afib, her pauses will also go away.  If she continues to have symptomatic bradycardia post ablation then she may be more willing to consider PPM at that time. Therapeutic strategies for afib/ atrial flutter including medicine and ablation were discussed in detail with the patient today. Risk, benefits, and alternatives to EP study and radiofrequency ablation for afib were also discussed in detail today. These risks include but are not limited to stroke, bleeding, vascular damage, tamponade, perforation, damage to the esophagus, lungs, and other structures, pulmonary vein stenosis, worsening renal function, and death. The patient understands these risk and wishes to proceed.  We will therefore proceed with catheter ablation at the next available time.  Will obtain cardiac CT prior to ablation to exclude LA thrombus.  2. Syncope/ pauses As above  3. HTN Stable No change required today  4. OSA Compliant with CPAP  Thompson Grayer MD, [facility] 05/14/2017 3:16 PM

## 2017-05-16 ENCOUNTER — Ambulatory Visit: Payer: BLUE CROSS/BLUE SHIELD

## 2017-05-21 ENCOUNTER — Telehealth: Payer: Self-pay | Admitting: *Deleted

## 2017-05-21 NOTE — Telephone Encounter (Signed)
Spoke with patient regarding patient-activated symptom episode on 05/20/17.  Patient reports that she had a dizzy spell but denies true syncope.  Episode correlated with ~4sec duration pause episode (appears post-termination of AF).  Patient is not currently driving.  AF ablation planned for 06/05/17.  Reviewed with Dr. Lovena Le, who advised continuing with current plan of treatment.    Advised patient of Dr. Tanna Furry recommendations and encouraged her to call our office or seek emergency care if symptoms worsen in severity or duration.  Patient is agreeable to this plan and is appreciative of assistance.  She denies additional questions or concerns at this time.

## 2017-05-22 ENCOUNTER — Other Ambulatory Visit: Payer: BLUE CROSS/BLUE SHIELD

## 2017-05-22 DIAGNOSIS — I480 Paroxysmal atrial fibrillation: Secondary | ICD-10-CM

## 2017-05-22 DIAGNOSIS — R001 Bradycardia, unspecified: Secondary | ICD-10-CM

## 2017-05-23 ENCOUNTER — Encounter: Payer: Self-pay | Admitting: Internal Medicine

## 2017-05-23 LAB — BASIC METABOLIC PANEL
BUN / CREAT RATIO: 18 (ref 12–28)
BUN: 13 mg/dL (ref 8–27)
CHLORIDE: 103 mmol/L (ref 96–106)
CO2: 21 mmol/L (ref 20–29)
Calcium: 8.8 mg/dL (ref 8.7–10.3)
Creatinine, Ser: 0.71 mg/dL (ref 0.57–1.00)
GFR calc Af Amer: 104 mL/min/{1.73_m2} (ref >59)
GFR calc non Af Amer: 90 mL/min/{1.73_m2} (ref >59)
GLUCOSE: 80 mg/dL (ref 65–99)
Potassium: 4.5 mmol/L (ref 3.5–5.2)
SODIUM: 139 mmol/L (ref 134–144)

## 2017-05-23 LAB — CBC WITH DIFFERENTIAL/PLATELET
BASOS ABS: 0 10*3/uL (ref 0.0–0.2)
Basos: 0 %
EOS (ABSOLUTE): 0.1 10*3/uL (ref 0.0–0.4)
Eos: 1 %
Hematocrit: 39.1 % (ref 34.0–46.6)
Hemoglobin: 13 g/dL (ref 11.1–15.9)
Immature Grans (Abs): 0 10*3/uL (ref 0.0–0.1)
Immature Granulocytes: 0 %
LYMPHS ABS: 3.1 10*3/uL (ref 0.7–3.1)
Lymphs: 33 %
MCH: 33.8 pg — ABNORMAL HIGH (ref 26.6–33.0)
MCHC: 33.2 g/dL (ref 31.5–35.7)
MCV: 102 fL — ABNORMAL HIGH (ref 79–97)
MONOCYTES: 6 %
MONOS ABS: 0.5 10*3/uL (ref 0.1–0.9)
NEUTROS ABS: 5.6 10*3/uL (ref 1.4–7.0)
Neutrophils: 60 %
Platelets: 233 10*3/uL (ref 150–379)
RBC: 3.85 x10E6/uL (ref 3.77–5.28)
RDW: 13.2 % (ref 12.3–15.4)
WBC: 9.4 10*3/uL (ref 3.4–10.8)

## 2017-05-29 NOTE — Telephone Encounter (Signed)
Reviewed with Dr. Rayann Heman on 05/28/17.  No additional recommendations at this time.

## 2017-05-30 NOTE — Telephone Encounter (Signed)
Spoke with patient to advise her that Dr. Rayann Heman recommended avoiding driving until her ablation and subsequent follow-up.  She verbalizes understanding of instructions.  Patient wanted to know how long an AF ablation usually takes.  Reviewed with Tommye Standard, PA, who advised it typically lasts 2-4hrs.  Patient verbalizes understanding and appreciation.  She denies additional questions or concerns at this time.

## 2017-06-04 ENCOUNTER — Ambulatory Visit: Payer: BLUE CROSS/BLUE SHIELD | Admitting: Internal Medicine

## 2017-06-04 ENCOUNTER — Encounter (HOSPITAL_COMMUNITY): Payer: Self-pay

## 2017-06-04 ENCOUNTER — Ambulatory Visit (HOSPITAL_COMMUNITY)
Admission: RE | Admit: 2017-06-04 | Discharge: 2017-06-04 | Disposition: A | Discharge status: Home / Self Care | Payer: BLUE CROSS/BLUE SHIELD | Source: Ambulatory Visit | Attending: Internal Medicine | Admitting: Internal Medicine

## 2017-06-04 DIAGNOSIS — I480 Paroxysmal atrial fibrillation: Secondary | ICD-10-CM

## 2017-06-04 DIAGNOSIS — I700 Atherosclerosis of aorta: Secondary | ICD-10-CM | POA: Insufficient documentation

## 2017-06-04 DIAGNOSIS — I4891 Unspecified atrial fibrillation: Secondary | ICD-10-CM | POA: Diagnosis not present

## 2017-06-04 DIAGNOSIS — K760 Fatty (change of) liver, not elsewhere classified: Secondary | ICD-10-CM | POA: Insufficient documentation

## 2017-06-04 MED ORDER — IOPAMIDOL (ISOVUE-370) INJECTION 76%
INTRAVENOUS | Status: AC
  Administered 2017-06-04: 80 mL via INTRAVENOUS
  Filled 2017-06-04: qty 100

## 2017-06-05 ENCOUNTER — Ambulatory Visit (HOSPITAL_COMMUNITY): Payer: BLUE CROSS/BLUE SHIELD | Admitting: Certified Registered Nurse Anesthetist

## 2017-06-05 ENCOUNTER — Ambulatory Visit (HOSPITAL_COMMUNITY)
Admission: RE | Admit: 2017-06-05 | Discharge: 2017-06-06 | Disposition: A | Discharge status: Home / Self Care | Payer: BLUE CROSS/BLUE SHIELD | Source: Ambulatory Visit | Attending: Internal Medicine | Admitting: Internal Medicine

## 2017-06-05 ENCOUNTER — Encounter (HOSPITAL_COMMUNITY)
Admission: RE | Disposition: A | Discharge status: Home / Self Care | Payer: Self-pay | Source: Ambulatory Visit | Attending: Internal Medicine

## 2017-06-05 ENCOUNTER — Encounter (HOSPITAL_COMMUNITY): Payer: Self-pay | Admitting: *Deleted

## 2017-06-05 DIAGNOSIS — G4733 Obstructive sleep apnea (adult) (pediatric): Secondary | ICD-10-CM | POA: Insufficient documentation

## 2017-06-05 DIAGNOSIS — Z79899 Other long term (current) drug therapy: Secondary | ICD-10-CM | POA: Diagnosis not present

## 2017-06-05 DIAGNOSIS — Z7901 Long term (current) use of anticoagulants: Secondary | ICD-10-CM | POA: Diagnosis not present

## 2017-06-05 DIAGNOSIS — I483 Typical atrial flutter: Secondary | ICD-10-CM | POA: Diagnosis not present

## 2017-06-05 DIAGNOSIS — F329 Major depressive disorder, single episode, unspecified: Secondary | ICD-10-CM | POA: Diagnosis not present

## 2017-06-05 DIAGNOSIS — B1920 Unspecified viral hepatitis C without hepatic coma: Secondary | ICD-10-CM | POA: Insufficient documentation

## 2017-06-05 DIAGNOSIS — F419 Anxiety disorder, unspecified: Secondary | ICD-10-CM | POA: Diagnosis not present

## 2017-06-05 DIAGNOSIS — Z7952 Long term (current) use of systemic steroids: Secondary | ICD-10-CM | POA: Diagnosis not present

## 2017-06-05 DIAGNOSIS — R55 Syncope and collapse: Secondary | ICD-10-CM | POA: Insufficient documentation

## 2017-06-05 DIAGNOSIS — E7800 Pure hypercholesterolemia, unspecified: Secondary | ICD-10-CM | POA: Insufficient documentation

## 2017-06-05 DIAGNOSIS — Z8619 Personal history of other infectious and parasitic diseases: Secondary | ICD-10-CM | POA: Insufficient documentation

## 2017-06-05 DIAGNOSIS — I480 Paroxysmal atrial fibrillation: Secondary | ICD-10-CM | POA: Diagnosis present

## 2017-06-05 DIAGNOSIS — I10 Essential (primary) hypertension: Secondary | ICD-10-CM | POA: Diagnosis not present

## 2017-06-05 DIAGNOSIS — F17210 Nicotine dependence, cigarettes, uncomplicated: Secondary | ICD-10-CM | POA: Insufficient documentation

## 2017-06-05 HISTORY — PX: ATRIAL FIBRILLATION ABLATION: EP1191

## 2017-06-05 LAB — POCT ACTIVATED CLOTTING TIME
ACTIVATED CLOTTING TIME: 285 s
ACTIVATED CLOTTING TIME: 307 s
Activated Clotting Time: 147 seconds

## 2017-06-05 SURGERY — ATRIAL FIBRILLATION ABLATION
Anesthesia: General

## 2017-06-05 MED ORDER — SODIUM CHLORIDE 0.9% FLUSH: 3.0000 mL | INTRAVENOUS | Status: DC | PRN

## 2017-06-05 MED ORDER — HYDROCODONE-ACETAMINOPHEN 5-325 MG PO TABS
1.0000 | ORAL_TABLET | ORAL | Status: DC | PRN
  Administered 2017-06-05 – 2017-06-06 (×4): 2 via ORAL
  Filled 2017-06-05 (×4): qty 2

## 2017-06-05 MED ORDER — LORAZEPAM 0.5 MG PO TABS
0.5000 mg | ORAL_TABLET | Freq: Every day | ORAL | Status: DC | PRN
  Administered 2017-06-06: 1 mg via ORAL
  Filled 2017-06-05: qty 2

## 2017-06-05 MED ORDER — BUPIVACAINE HCL (PF) 0.25 % IJ SOLN
INTRAMUSCULAR | Status: DC | PRN
  Administered 2017-06-05: 20 mL

## 2017-06-05 MED ORDER — ISOPROTERENOL HCL 0.2 MG/ML IJ SOLN
INTRAVENOUS | Status: DC | PRN
  Administered 2017-06-05: 20 ug/min via INTRAVENOUS

## 2017-06-05 MED ORDER — FENTANYL CITRATE (PF) 100 MCG/2ML IJ SOLN
INTRAMUSCULAR | Status: DC | PRN
  Administered 2017-06-05 (×2): 100 ug via INTRAVENOUS

## 2017-06-05 MED ORDER — ACETAMINOPHEN 325 MG PO TABS: 650.0000 mg | ORAL_TABLET | ORAL | Status: DC | PRN

## 2017-06-05 MED ORDER — HEPARIN SODIUM (PORCINE) 1000 UNIT/ML IJ SOLN
INTRAMUSCULAR | Status: DC | PRN
  Administered 2017-06-05 (×2): 1000 [IU] via INTRAVENOUS
  Administered 2017-06-05: 12000 [IU] via INTRAVENOUS

## 2017-06-05 MED ORDER — APIXABAN 5 MG PO TABS
5.0000 mg | ORAL_TABLET | Freq: Two times a day (BID) | ORAL | Status: DC
  Administered 2017-06-05 – 2017-06-06 (×2): 5 mg via ORAL
  Filled 2017-06-05 (×2): qty 1

## 2017-06-05 MED ORDER — HEPARIN SODIUM (PORCINE) 1000 UNIT/ML IJ SOLN
INTRAMUSCULAR | Status: DC | PRN
  Administered 2017-06-05: 2000 [IU] via INTRAVENOUS
  Administered 2017-06-05: 3000 [IU] via INTRAVENOUS

## 2017-06-05 MED ORDER — OXYCODONE HCL 5 MG/5ML PO SOLN: 5.0000 mg | Freq: Once | ORAL | Status: AC | PRN

## 2017-06-05 MED ORDER — MIDAZOLAM HCL 5 MG/5ML IJ SOLN
INTRAMUSCULAR | Status: DC | PRN
  Administered 2017-06-05: 2 mg via INTRAVENOUS

## 2017-06-05 MED ORDER — IOPAMIDOL (ISOVUE-370) INJECTION 76%
INTRAVENOUS | Status: DC | PRN
  Administered 2017-06-05: 3 mL

## 2017-06-05 MED ORDER — SODIUM CHLORIDE 0.9 % IV SOLN: 250.0000 mL | INTRAVENOUS | Status: DC | PRN

## 2017-06-05 MED ORDER — PROMETHAZINE HCL 25 MG/ML IJ SOLN: 6.2500 mg | INTRAMUSCULAR | Status: DC | PRN

## 2017-06-05 MED ORDER — GLYCOPYRROLATE 0.2 MG/ML IJ SOLN
INTRAMUSCULAR | Status: DC | PRN
  Administered 2017-06-05: 0.2 mg via INTRAVENOUS

## 2017-06-05 MED ORDER — METOPROLOL SUCCINATE ER 50 MG PO TB24
50.0000 mg | ORAL_TABLET | Freq: Every day | ORAL | Status: DC
  Administered 2017-06-06: 50 mg via ORAL
  Filled 2017-06-05: qty 1

## 2017-06-05 MED ORDER — LIDOCAINE HCL (PF) 1 % IJ SOLN
INTRAMUSCULAR | Status: AC
  Filled 2017-06-05: qty 30

## 2017-06-05 MED ORDER — SODIUM CHLORIDE 0.9% FLUSH
3.0000 mL | Freq: Two times a day (BID) | INTRAVENOUS | Status: DC
  Administered 2017-06-05: 3 mL via INTRAVENOUS

## 2017-06-05 MED ORDER — FLUOXETINE HCL 20 MG PO TABS
20.0000 mg | ORAL_TABLET | ORAL | Status: DC
  Administered 2017-06-06: 20 mg via ORAL
  Filled 2017-06-05 (×2): qty 1

## 2017-06-05 MED ORDER — ONDANSETRON HCL 4 MG/2ML IJ SOLN
4.0000 mg | Freq: Four times a day (QID) | INTRAMUSCULAR | Status: DC | PRN
Start: 2017-06-05 — End: 2017-06-06

## 2017-06-05 MED ORDER — LIDOCAINE HCL (CARDIAC) 20 MG/ML IV SOLN
INTRAVENOUS | Status: DC | PRN
  Administered 2017-06-05: 100 mg via INTRATRACHEAL

## 2017-06-05 MED ORDER — PROTAMINE SULFATE 10 MG/ML IV SOLN
INTRAVENOUS | Status: DC | PRN
  Administered 2017-06-05: 10 mg via INTRAVENOUS
  Administered 2017-06-05: 20 mg via INTRAVENOUS

## 2017-06-05 MED ORDER — PHENYLEPHRINE HCL 10 MG/ML IJ SOLN
INTRAVENOUS | Status: DC | PRN
  Administered 2017-06-05: 25 ug/min via INTRAVENOUS

## 2017-06-05 MED ORDER — HEPARIN SODIUM (PORCINE) 1000 UNIT/ML IJ SOLN
INTRAMUSCULAR | Status: AC
  Filled 2017-06-05: qty 1

## 2017-06-05 MED ORDER — ISOPROTERENOL HCL 0.2 MG/ML IJ SOLN
INTRAMUSCULAR | Status: AC
  Filled 2017-06-05: qty 5

## 2017-06-05 MED ORDER — OXYCODONE HCL 5 MG PO TABS
5.0000 mg | ORAL_TABLET | Freq: Once | ORAL | Status: AC | PRN
  Administered 2017-06-05: 5 mg via ORAL
  Filled 2017-06-05: qty 1

## 2017-06-05 MED ORDER — HYDROMORPHONE HCL 1 MG/ML IJ SOLN: 0.2500 mg | INTRAMUSCULAR | Status: DC | PRN

## 2017-06-05 MED ORDER — TRAZODONE HCL 50 MG PO TABS
50.0000 mg | ORAL_TABLET | Freq: Every day | ORAL | Status: DC
  Administered 2017-06-05: 50 mg via ORAL
  Filled 2017-06-05: qty 1

## 2017-06-05 MED ORDER — IOPAMIDOL (ISOVUE-370) INJECTION 76%
INTRAVENOUS | Status: AC
  Filled 2017-06-05: qty 50

## 2017-06-05 MED ORDER — PROPOFOL 10 MG/ML IV BOLUS
INTRAVENOUS | Status: DC | PRN
  Administered 2017-06-05: 200 mg via INTRAVENOUS

## 2017-06-05 MED ORDER — SODIUM CHLORIDE 0.9 % IV SOLN
INTRAVENOUS | Status: DC
  Administered 2017-06-05 (×2): via INTRAVENOUS

## 2017-06-05 MED ORDER — ONDANSETRON HCL 4 MG/2ML IJ SOLN
INTRAMUSCULAR | Status: DC | PRN
  Administered 2017-06-05: 4 mg via INTRAVENOUS

## 2017-06-05 SURGICAL SUPPLY — 17 items
BAG SNAP BAND KOVER 36X36 (MISCELLANEOUS) ×2 IMPLANT
BLANKET WARM UNDERBOD FULL ACC (MISCELLANEOUS) ×2 IMPLANT
CATH SMTCH THERMOCOOL SF DF (CATHETERS) ×2 IMPLANT
CATH SOUNDSTAR ECO REPROCESSED (CATHETERS) ×2 IMPLANT
CATH VARIABLE LASSO NAV 2515 (CATHETERS) ×2 IMPLANT
CATH WEBSTER BI DIR CS D-F CRV (CATHETERS) ×2 IMPLANT
COVER SWIFTLINK CONNECTOR (BAG) ×2 IMPLANT
NEEDLE TRANSEP BRK 71CM 407200 (NEEDLE) ×2 IMPLANT
PACK EP LATEX FREE (CUSTOM PROCEDURE TRAY) ×1
PACK EP LF (CUSTOM PROCEDURE TRAY) ×1 IMPLANT
PAD DEFIB LIFELINK (PAD) ×2 IMPLANT
PATCH CARTO3 (PAD) ×2 IMPLANT
SHEATH AVANTI 11F 11CM (SHEATH) ×2 IMPLANT
SHEATH PINNACLE 7F 10CM (SHEATH) ×4 IMPLANT
SHEATH PINNACLE 9F 10CM (SHEATH) ×2 IMPLANT
SHEATH SWARTZ TS SL2 63CM 8.5F (SHEATH) ×2 IMPLANT
TUBING SMART ABLATE COOLFLOW (TUBING) ×2 IMPLANT

## 2017-06-05 NOTE — Interval H&P Note (Signed)
History and Physical Interval Note:  06/05/2017 10:38 AM  Theresa Reynolds  has presented today for surgery, with the diagnosis of AFIB  The various methods of treatment have been discussed with the patient and family. After consideration of risks, benefits and other options for treatment, the patient has consented to  Procedure(s): Atrial Fibrillation Ablation (N/A) as a surgical intervention .  The patient's history has been reviewed, patient examined, no change in status, stable for surgery.  I have reviewed the patient's chart and labs.  Questions were answered to the patient's satisfaction.    Cardiac CT reviewed She reports compliance with eliquis without interruption since her last visit.  Thompson Grayer

## 2017-06-05 NOTE — Anesthesia Preprocedure Evaluation (Addendum)
Anesthesia Evaluation  Patient identified by MRN, date of birth, ID band Patient awake    Reviewed: Allergy & Precautions, NPO status , Patient's Chart, lab work & pertinent test results, reviewed documented beta blocker date and time   Airway Mallampati: III  TM Distance: >3 FB Neck ROM: Full    Dental no notable dental hx.    Pulmonary Current Smoker,    Pulmonary exam normal breath sounds clear to auscultation       Cardiovascular hypertension, Pt. on home beta blockers and Pt. on medications Normal cardiovascular exam+ dysrhythmias Atrial Fibrillation  Rhythm:Regular Rate:Normal  ECG: NSR, rate 64   Neuro/Psych PSYCHIATRIC DISORDERS negative neurological ROS     GI/Hepatic negative GI ROS, (+) Hepatitis -, C  Endo/Other  negative endocrine ROS  Renal/GU negative Renal ROS     Musculoskeletal negative musculoskeletal ROS (+)   Abdominal (+) + obese,   Peds  Hematology negative hematology ROS (+)   Anesthesia Other Findings Hypercholestolemia  Reproductive/Obstetrics                            Anesthesia Physical Anesthesia Plan  ASA: III  Anesthesia Plan: General   Post-op Pain Management:    Induction: Intravenous  PONV Risk Score and Plan: 2 and Ondansetron, Dexamethasone, Propofol infusion and Midazolam  Airway Management Planned: LMA  Additional Equipment:   Intra-op Plan:   Post-operative Plan: Extubation in OR  Informed Consent: I have reviewed the patients History and Physical, chart, labs and discussed the procedure including the risks, benefits and alternatives for the proposed anesthesia with the patient or authorized representative who has indicated his/her understanding and acceptance.   Dental advisory given  Plan Discussed with: CRNA  Anesthesia Plan Comments:         Anesthesia Quick Evaluation

## 2017-06-05 NOTE — Transfer of Care (Signed)
Immediate Anesthesia Transfer of Care Note  Patient: Theresa Reynolds  Procedure(s) Performed: Procedure(s): Atrial Fibrillation Ablation (N/A)  Patient Location: Cath Lab  Anesthesia Type:General  Level of Consciousness: awake, alert  and oriented  Airway & Oxygen Therapy: Patient Spontanous Breathing and Patient connected to nasal cannula oxygen  Post-op Assessment: Report given to RN, Post -op Vital signs reviewed and stable and Patient moving all extremities X 4  Post vital signs: Reviewed and stable  Last Vitals:  Vitals:   06/05/17 0822  BP: (!) 142/88  Pulse: (!) 110  Resp: 18  Temp: 36.8 C    Last Pain:  Vitals:   06/05/17 0822  TempSrc: Oral         Complications: No apparent anesthesia complications

## 2017-06-05 NOTE — H&P (View-Only) (Signed)
PCP: [facility] @ [place] Primary Cardiologist: Dr Claudie Leach Roque Cash PA-C  Theresa Reynolds is a 66 y.o. female who presents today for routine electrophysiology followup. She continues to have afib with termination presyncope.  ILR recently placed has documented post termination pauses with her afib of up to 7 seconds.  She has not had full syncope.  Today, she denies symptoms of palpitations, chest pain, shortness of breath,  lower extremity edema , or syncope.  The patient is otherwise without complaint today.   Past Medical History:  Diagnosis Date  . Anxiety and depression    s/p suicide attempt 11/11  . Aortic insufficiency    mild by echo 10/17  . Atrial fibrillation ([place])   . Carotid stenosis, bilateral    0-39% by [place]  . Colitis   . Hepatitis C    Followed by Dr. Dallas Breeding at [facility].  s/p treatment with Bavoni.  [name] History of shingles   . HTN (hypertension)   . Hypercholesteremia   . Hypersomnia   . OSA (obstructive sleep apnea)   . Right shoulder pain   . Seasonal allergies   . Syncope and collapse   . Tobacco use   . Vitamin D deficiency    Past Surgical History:  Procedure Laterality Date  . BREAST REDUCTION SURGERY  10/1999   With Dr. Towanda Malkin  . CESAREAN SECTION    . LOOP RECORDER INSERTION N/A 05/07/2017   Procedure: Loop Recorder Insertion;  Surgeon: Thompson Grayer, MD;  Location: [facility];  Service: Cardiovascular;  Laterality: N/A;    ROS- all systems are reviewed and negatives except as per HPI above  Current Outpatient Prescriptions  Medication Sig Dispense Refill  . apixaban (ELIQUIS) 5 MG TABS tablet Take 5 mg by mouth 2 (two) times daily.    . cetirizine (ZYRTEC) 10 MG tablet Take 10 mg by mouth daily as needed for allergies.     . fluticasone (FLONASE) 50 MCG/ACT nasal spray Place 2 sprays into both nostrils daily as needed for allergies or rhinitis.    [name] LORazepam (ATIVAN) 1 MG tablet Take 0.5-1 mg by  mouth daily as needed for anxiety.     . metoprolol succinate (TOPROL-XL) 100 MG 24 hr tablet Take 100 mg by mouth daily. Take with or immediately following a meal.    . Multiple Vitamins-Minerals (EMERGEN-C IMMUNE) PACK Take 1 packet by mouth daily as needed (immune support).     . simvastatin (ZOCOR) 20 MG tablet Take 20 mg by mouth at bedtime.     . Tetrahydrozoline HCl (VISINE OP) Apply 1 drop to eye daily as needed (allergies).    . traZODone (DESYREL) 50 MG tablet Take 50 mg by mouth at bedtime.     [name] venlafaxine XR (EFFEXOR-XR) 37.5 MG 24 hr capsule Take 112.5 mg by mouth daily.   0   No current facility-administered medications for this visit.     Physical Exam: Vitals:   05/14/17 1503  BP: 122/76  Pulse: 66  SpO2: 94%  Weight: 175 lb (79.4 kg)  Height: 5\' 1"  (1.549 m)    GEN- The patient is well appearing, alert and oriented x 3 today.   Head- normocephalic, atraumatic Eyes-  Sclera clear, conjunctiva pink Ears- hearing intact Oropharynx- clear Lungs- Clear to ausculation bilaterally, normal work of breathing Heart- Regular rate and rhythm, no murmurs, rubs or gallops, PMI not laterally displaced GI- soft, NT, ND, + BS Extremities- no clubbing, cyanosis,  or edema  ILR interrogation ordered today is personally reviewed and shows afib and atrial flutter with post termination pauses of 4-7 seconds Echo is reviewed  Assessment and Plan:  1. Paroxysmal atrial fibrillation/ atrial flutter/ post termination pauses with presyncope The patient has ongoing atrial arrhythmias.  Medical therapy is limited by pauses.  I have reduced toprol to 50mg  daily today.  This is not a long term solution due to afib with RVR episodes.  I have advised PPM implantation to prevent pauses.  She would also be a candidate for Micra.  At this time, she is clear that she would like to avoid pacing.  I have therefore offered afib ablation as a secondary option.  I do believe that if we can resolve her  afib, her pauses will also go away.  If she continues to have symptomatic bradycardia post ablation then she may be more willing to consider PPM at that time. Therapeutic strategies for afib/ atrial flutter including medicine and ablation were discussed in detail with the patient today. Risk, benefits, and alternatives to EP study and radiofrequency ablation for afib were also discussed in detail today. These risks include but are not limited to stroke, bleeding, vascular damage, tamponade, perforation, damage to the esophagus, lungs, and other structures, pulmonary vein stenosis, worsening renal function, and death. The patient understands these risk and wishes to proceed.  We will therefore proceed with catheter ablation at the next available time.  Will obtain cardiac CT prior to ablation to exclude LA thrombus.  2. Syncope/ pauses As above  3. HTN Stable No change required today  4. OSA Compliant with CPAP  Thompson Grayer MD, [facility] 05/14/2017 3:16 PM

## 2017-06-05 NOTE — Anesthesia Procedure Notes (Signed)
Procedure Name: LMA Insertion Date/Time: 06/05/2017 11:18 AM Performed by: Mariea Clonts Pre-anesthesia Checklist: Patient identified, Emergency Drugs available, Suction available and Patient being monitored Patient Re-evaluated:Patient Re-evaluated prior to induction Oxygen Delivery Method: Circle System Utilized Preoxygenation: Pre-oxygenation with 100% oxygen Induction Type: IV induction Ventilation: Mask ventilation without difficulty LMA: LMA inserted LMA Size: 4.0 Number of attempts: 1 Airway Equipment and Method: Bite block Placement Confirmation: positive ETCO2 Tube secured with: Tape Dental Injury: Teeth and Oropharynx as per pre-operative assessment

## 2017-06-05 NOTE — Progress Notes (Signed)
Patients 7, 9, and 11 Fr right femoral vein sheaths were removed and manual pressure was held for over 20 minutes. Sheath removal site remained a level 0 and had no signs of a hematoma during or post sheath pull. Patients VS's remained WNL's and patient had no complaints during and post sheath pull. Sheath site was dressed with a gauze and Tegaderm dressing and was clean, dry, and intact. Patient was educated on post sheath pull instructions and responded that she understood. Bedrest began at 1530 and is for six hours and will end at 2130. Will continue to monitor patient at this time.

## 2017-06-05 NOTE — Discharge Summary (Signed)
ELECTROPHYSIOLOGY PROCEDURE DISCHARGE SUMMARY    Patient ID: Theresa Reynolds,  MRN: [id number], DOB/AGE: 1950/12/21 66 y.o.  Admit date: 06/05/2017 Discharge date: 06/06/2017  Primary Care Physician: [facility]  Primary Cardiologist: Dr. Burnard Hawthorne, PA-C Electrophysiologist: Thompson Grayer, MD  Primary Discharge Diagnosis:  1. Paroxysmal AFIb/flutter      CHA2DS2Vasc is at least 2, on Eliquis  Secondary Discharge Diagnosis:  1. HTN  Procedures This Admission:  1.  Electrophysiology study and radiofrequency catheter ablation on 06/05/17 by Dr Thompson Grayer.   This study demonstrated     Brief HPI: Theresa Reynolds is a 66 y.o. female with a history of paroxysmal AFib with symptomatic post termination pauses with near syncope, recommended to have PPM implant though wanted to avoid this and decided on efforts of rhythm control with ablation, given medication options limited with brady events,  atrial fibrillation.   Risks, benefits, and alternatives to catheter ablation of atrial fibrillation were reviewed with the patient who wished to proceed.  The patient underwent cardiac CT prior to the procedure which demonstrated no LAA thrombus.    Hospital Course:  The patient was admitted and underwent EPS/RFCA of atrial fibrillation with details as outlined above.  She was monitored on telemetry overnight which demonstrated SR.  Right groin was without complication on the day of discharge.  She has some diffuse pleuritic type chest discomfort, EKG is normal. BP/exam stable. Recommended OTC Tylenol not to exceed label directions for 2-3 days.  The patient was examined by Dr. Rayann Heman and considered to be stable for discharge.  Wound care and restrictions were reviewed with the patient.  The patient will be seen back by Roderic Palau, NP in 4 weeks and Dr Rayann Heman in 12 weeks for post ablation follow up.   She was reminded given her history, no driving until cleared to  by Dr. Rayann Heman.   Physical Exam: Vitals:   06/05/17 1930 06/05/17 2050 06/05/17 2330 06/06/17 0411  BP:  (!) 144/79  136/60  Pulse: 77 74 71 77  Resp: (!) 24 19 16 14   Temp:  98 F (36.7 C)  (!) 97.5 F (36.4 C)  TempSrc:  Oral  Oral  SpO2: 98% 98% 96% 97%  Weight:    183 lb 12.8 oz (83.4 kg)  Height:        GEN- The patient is well appearing, alert and oriented x 3 today.   HEENT: normocephalic, atraumatic; sclera clear, conjunctiva pink; hearing intact; oropharynx clear; neck supple  Lungs- CTA b/l, normal work of breathing.  No wheezes, rales, rhonchi Heart- RRR, 1/6 SM, no rubs or gallops are appreciated, heart sounds are not disctant  GI- soft, non-tender, non-distended, bowel sounds present  Extremities- no clubbing, cyanosis, or edema; DP/PT/radial pulses 2+ bilaterally,  right groin without hematoma/bruit MS- no significant deformity or atrophy Skin- warm and dry, no rash or lesion Psych- euthymic mood, full affect Neuro- strength and sensation are intact   Labs:   Lab Results  Component Value Date   WBC 9.4 05/22/2017   HGB 13.0 05/22/2017   HCT 39.1 05/22/2017   MCV 102 (H) 05/22/2017   PLT 233 05/22/2017   No results for input(s): NA, K, CL, CO2, BUN, CREATININE, CALCIUM, PROT, BILITOT, ALKPHOS, ALT, AST, GLUCOSE in the last 168 hours.  Invalid input(s): LABALBU   Discharge Medications:  Allergies as of 06/06/2017      Reactions   Cymbalta [duloxetine Hcl] Other (See Comments)  Dizzy      Medication List    TAKE these medications   cetirizine 10 MG tablet Commonly known as:  ZYRTEC Take 10 mg by mouth daily as needed for allergies.   ELIQUIS 5 MG Tabs tablet Generic drug:  apixaban Take 5 mg by mouth 2 (two) times daily.   EMERGEN-C IMMUNE Pack Take 1 packet by mouth daily as needed (immune support).   FLUoxetine 20 MG tablet Commonly known as:  PROZAC Take 20 mg by mouth every morning.   fluticasone 50 MCG/ACT nasal spray Commonly known  as:  FLONASE Place 2 sprays into both nostrils daily as needed for allergies or rhinitis.   LORazepam 1 MG tablet Commonly known as:  ATIVAN Take 0.5-1 mg by mouth daily as needed for anxiety.   metoprolol succinate 100 MG 24 hr tablet Commonly known as:  TOPROL-XL Take 50 mg by mouth daily. Take with or immediately following a meal.   pantoprazole 40 MG tablet Commonly known as:  PROTONIX Take 1 tablet (40 mg total) by mouth daily.   simvastatin 20 MG tablet Commonly known as:  ZOCOR Take 20 mg by mouth at bedtime.   traZODone 50 MG tablet Commonly known as:  DESYREL Take 50 mg by mouth at bedtime.   VISINE OP Apply 1 drop to eye daily as needed (allergies).       Disposition: Home  Follow-up Information    [facility] Follow up on 07/03/2017.   Specialty:  Cardiology Why:  2:30PM Contact information: [address] [id number] [place] [id number]       Thompson Grayer, MD Follow up on 09/10/2017.   Specialty:  Cardiology Why:  9:30AM Contact information: [place] [address] [phone]           Duration of Discharge Encounter: Greater than 30 minutes including physician time.  Signed, Tommye Standard, PA-C 06/06/2017 8:22 AM   I have seen, examined the patient, and reviewed the above assessment and plan.  Changes to above are made where necessary.  + typical post operative chest pain.  Otherwise doing well.  Routine follow-up.  She will contact our office if her pain persists or worsens.  Co Sign: Thompson Grayer, MD 06/06/2017 9:11 AM

## 2017-06-06 ENCOUNTER — Telehealth: Payer: Self-pay | Admitting: Internal Medicine

## 2017-06-06 ENCOUNTER — Ambulatory Visit (INDEPENDENT_AMBULATORY_CARE_PROVIDER_SITE_OTHER): Payer: BLUE CROSS/BLUE SHIELD | Admitting: *Deleted

## 2017-06-06 ENCOUNTER — Encounter (HOSPITAL_COMMUNITY): Payer: Self-pay | Admitting: Internal Medicine

## 2017-06-06 DIAGNOSIS — R55 Syncope and collapse: Secondary | ICD-10-CM

## 2017-06-06 DIAGNOSIS — I483 Typical atrial flutter: Secondary | ICD-10-CM | POA: Diagnosis not present

## 2017-06-06 DIAGNOSIS — I480 Paroxysmal atrial fibrillation: Secondary | ICD-10-CM | POA: Diagnosis not present

## 2017-06-06 DIAGNOSIS — I10 Essential (primary) hypertension: Secondary | ICD-10-CM | POA: Diagnosis not present

## 2017-06-06 DIAGNOSIS — F17210 Nicotine dependence, cigarettes, uncomplicated: Secondary | ICD-10-CM | POA: Diagnosis not present

## 2017-06-06 MED ORDER — PANTOPRAZOLE SODIUM 40 MG PO TBEC: 40.0000 mg | DELAYED_RELEASE_TABLET | Freq: Every day | ORAL | 0 refills | Status: DC

## 2017-06-06 MED ORDER — COLCHICINE 0.6 MG PO TABS: 0.6000 mg | ORAL_TABLET | Freq: Two times a day (BID) | ORAL | 0 refills | Status: DC

## 2017-06-06 NOTE — Telephone Encounter (Addendum)
Pt had an ablation yesterday she was D/C  from the hospital this AM. Pt C/O of pain all over her chest and some sob. Pt states she told her nurse about the pain before she left the hospital it was told to take tylenol for the pain. Pt said that the pain  is possible from the breathing tube. Pt denies muscle pain. Pt took 3 extra strength tylenol about 2 hours  ago and it doesn't help. Pt would like to know what else she can take. Dr Ky Barban MD aware of pt's symptoms and  recommends Colchicine 0.6 mg one tablet by mouth twice a day. Pt is aware of  prescription was send to [facility] [address]. Pt is aware pt is aware to call the A-fib clinic if needed.

## 2017-06-06 NOTE — Plan of Care (Signed)
Problem: Pain Managment: Goal: General experience of comfort will improve Outcome: Progressing Pt's pain is improving. Pain medications given per orders. Pt currently sleeping. VS stable.   Problem: Tissue Perfusion: Goal: Risk factors for ineffective tissue perfusion will decrease Outcome: Progressing Pt cath site level 0. Pt ambulated to the bathroom with assistance after bedrest was done. Cath site remained level 0. Will continue to monitor and assess.

## 2017-06-06 NOTE — Discharge Instructions (Signed)
No driving as discussed until cleared to by Dr. Rayann Heman. No lifting over 5 lbs for 1 week. No vigorous or sexual activity for 1 week. You may return to work on 06/12/17. Keep procedure site clean & dry. If you notice increased pain, swelling, bleeding or pus, call/return!  You may shower, but no soaking baths/hot tubs/pools for 1 week.    You have an appointment set up with the [facility].  Multiple studies have shown that being followed by a dedicated atrial fibrillation clinic in addition to the standard care you receive from your other physicians improves health. We believe that enrollment in the atrial fibrillation clinic will allow [place] to better care for you.   The phone number to the [facility] is [phone]. The clinic is staffed Monday through Friday from 8:30am to 5pm.  Parking Directions: The clinic is located in the Heart and Vascular Building connected to [facility]. 1)From [address] turn on to [address] and go to the 3rd entrance  (Heart and Vascular entrance) on the right. 2)Look to the right for Heart &Vascular Parking Garage. 3)A code for the entrance is required please call the clinic to receive this.   4)Take the elevators to the 1st floor. Registration is in the room with the glass walls at the end of the hallway.  If you have any trouble parking or locating the clinic, please dont hesitate to call [phone].

## 2017-06-06 NOTE — Telephone Encounter (Signed)
New Message  Pt call requesting to speak with RN about her pain level after having the ablation. pt would like to know if there is something she can be prescribed for pain. Please call back to discuss

## 2017-06-06 NOTE — Anesthesia Postprocedure Evaluation (Signed)
Anesthesia Post Note  Patient: Theresa Reynolds  Procedure(s) Performed: Procedure(s) (LRB): Atrial Fibrillation Ablation (N/A)     Patient location during evaluation: PACU Anesthesia Type: General Level of consciousness: awake and alert Pain management: pain level controlled Vital Signs Assessment: post-procedure vital signs reviewed and stable Respiratory status: spontaneous breathing, nonlabored ventilation, respiratory function stable and patient connected to nasal cannula oxygen Cardiovascular status: blood pressure returned to baseline and stable Postop Assessment: no signs of nausea or vomiting Anesthetic complications: no    Last Vitals:  Vitals:   06/05/17 2330 06/06/17 0411  BP:  136/60  Pulse: 71 77  Resp: 16 14  Temp:  (!) 36.4 C    Last Pain:  Vitals:   06/06/17 0411  TempSrc: Oral  PainSc: 4                  Ryan P Ellender

## 2017-06-07 ENCOUNTER — Telehealth: Payer: Self-pay | Admitting: *Deleted

## 2017-06-07 NOTE — Progress Notes (Signed)
Carelink Summary Report / Loop Recorder 

## 2017-06-07 NOTE — Telephone Encounter (Signed)
Prior authorization for COLCHICINE done through covermymeds.

## 2017-06-11 ENCOUNTER — Telehealth: Payer: Self-pay | Admitting: *Deleted

## 2017-06-11 NOTE — Telephone Encounter (Signed)
Spoke with patient to request a manual Carelink transmission for review of symptom and pause episode.  Patient reports 6 symptomatic pause episodes since AF ablation.  No syncope, patient was sitting during each episode.  She also reports ShOB with exertion and has felt this way since the procedure.  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  Previously reported chest discomfort is improved since starting colchicine.  Advised that as AF ablation is irritating to the heart, may have more AF initially post-ablation during the healing proccess.  Patient verbalizes understanding of information.  Assisted patient in sending a manual transmission for review.  Advised that I'll review episodes with Dr. Rayann Heman and call her with any recommendations.  Also advised that in the future, patient does not need to use symptom activator except for new or worsened symptoms as we are aware she is symptomatic with post-termination pauses.  Instructed patient to call the [facility] if she does use her symptom activator.  Patient appreciative of explanation and denies additional questions at this time.

## 2017-06-11 NOTE — Telephone Encounter (Signed)
Reviewed with Dr. Rayann Heman, who advised that ShOB is to be expected, as are more frequent AF episodes in the immediate post-ablation healing period.  He recommended that patient contact the AF Clinic with any new or worsening symptoms.  Patient advised of recommendations and agreeable to this plan.  She has the AF Clinic contact information written down.  Patient is appreciative of assistance and denies additional questions or concerns at this time.

## 2017-06-12 NOTE — Telephone Encounter (Signed)
[employer] has denied the pts Colchicine PA. Reasons listed: 1. The request does not meet the definition of medical necessity found in the members benefit. 2. Restricted access medications (Colchicine) may be covered when 2 alternative medications have been tried and failed.   Will forward to Dr Rayann Heman and his nurse for review and advisement.

## 2017-06-13 NOTE — Telephone Encounter (Signed)
Patient has already started taking medication according to 06/12/17 phone note.  #14 we called in with no refills.

## 2017-06-15 MED ORDER — FUROSEMIDE 20 MG PO TABS: ORAL_TABLET | ORAL | 0 refills | Status: DC

## 2017-06-15 NOTE — Telephone Encounter (Signed)
Called to check on patient per request of Dr. Rayann Heman - pt states she finished colchicine RX on yesterday she does seem to be improved pain wise but pt states she is still very short of breath. Pt does not weigh herself daily but states she is very bloated in her abdomen and has some lower extremity swelling. Discussed with Roderic Palau NP will prescribe lasix 20mg  daily for the next 3 days and follow up early next week. Pt in agreement with this plan.

## 2017-06-15 NOTE — Addendum Note (Signed)
Addended by: Juluis Mire on: 06/15/2017 02:55 PM   Modules accepted: Orders

## 2017-06-18 ENCOUNTER — Ambulatory Visit (HOSPITAL_COMMUNITY)
Admission: RE | Admit: 2017-06-18 | Discharge: 2017-06-18 | Disposition: A | Discharge status: Home / Self Care | Payer: BLUE CROSS/BLUE SHIELD | Source: Ambulatory Visit | Attending: Nurse Practitioner | Admitting: Nurse Practitioner

## 2017-06-18 ENCOUNTER — Encounter (HOSPITAL_COMMUNITY): Payer: Self-pay | Admitting: Nurse Practitioner

## 2017-06-18 VITALS — BP 142/82 | HR 60 | Ht 61.0 in | Wt 176.8 lb

## 2017-06-18 DIAGNOSIS — I10 Essential (primary) hypertension: Secondary | ICD-10-CM | POA: Insufficient documentation

## 2017-06-18 DIAGNOSIS — Z79899 Other long term (current) drug therapy: Secondary | ICD-10-CM | POA: Insufficient documentation

## 2017-06-18 DIAGNOSIS — E7800 Pure hypercholesterolemia, unspecified: Secondary | ICD-10-CM | POA: Diagnosis not present

## 2017-06-18 DIAGNOSIS — G4733 Obstructive sleep apnea (adult) (pediatric): Secondary | ICD-10-CM | POA: Insufficient documentation

## 2017-06-18 DIAGNOSIS — R0600 Dyspnea, unspecified: Secondary | ICD-10-CM

## 2017-06-18 DIAGNOSIS — E559 Vitamin D deficiency, unspecified: Secondary | ICD-10-CM | POA: Diagnosis not present

## 2017-06-18 DIAGNOSIS — F17210 Nicotine dependence, cigarettes, uncomplicated: Secondary | ICD-10-CM | POA: Diagnosis not present

## 2017-06-18 DIAGNOSIS — F329 Major depressive disorder, single episode, unspecified: Secondary | ICD-10-CM | POA: Diagnosis not present

## 2017-06-18 DIAGNOSIS — Z9889 Other specified postprocedural states: Secondary | ICD-10-CM | POA: Insufficient documentation

## 2017-06-18 DIAGNOSIS — F419 Anxiety disorder, unspecified: Secondary | ICD-10-CM | POA: Insufficient documentation

## 2017-06-18 DIAGNOSIS — Z7901 Long term (current) use of anticoagulants: Secondary | ICD-10-CM | POA: Insufficient documentation

## 2017-06-18 DIAGNOSIS — I4891 Unspecified atrial fibrillation: Secondary | ICD-10-CM | POA: Diagnosis present

## 2017-06-18 DIAGNOSIS — I480 Paroxysmal atrial fibrillation: Secondary | ICD-10-CM | POA: Diagnosis not present

## 2017-06-18 LAB — CUP PACEART REMOTE DEVICE CHECK
Date Time Interrogation Session: 20180801194232
MDC IDC PG IMPLANT DT: 20180702

## 2017-06-18 LAB — BASIC METABOLIC PANEL
Anion gap: 9 (ref 5–15)
BUN: 9 mg/dL (ref 6–20)
CHLORIDE: 103 mmol/L (ref 101–111)
CO2: 25 mmol/L (ref 22–32)
Calcium: 8.9 mg/dL (ref 8.9–10.3)
Creatinine, Ser: 0.84 mg/dL (ref 0.44–1.00)
GFR calc Af Amer: >60 mL/min (ref >60)
GFR calc non Af Amer: >60 mL/min (ref >60)
Glucose, Bld: 100 mg/dL — ABNORMAL HIGH (ref 65–99)
Potassium: 3.6 mmol/L (ref 3.5–5.1)
Sodium: 137 mmol/L (ref 135–145)

## 2017-06-18 NOTE — Progress Notes (Signed)
Primary Care Physician: [facility] [place] Referring Physician: Dr. Albin Felling Theresa Reynolds is a 66 y.o. female with a h/o afib /termination pauses that recently had ablation 06/05/17. She noted some shortness of breath after the procedure. She called the office Friday with same complaints and I advised lasix 20 mg x 3 days. Her weight is now within a lb of ablation and she is now breathing better but still feels that she is not at baseline. She has been In SR. So far, no swallowing or groin issues.  Today, she denies symptoms of palpitations, chest pain, shortness of breath, orthopnea, PND, lower extremity edema, dizziness, presyncope, syncope, or neurologic sequela. The patient is tolerating medications without difficulties and is otherwise without complaint today.   Past Medical History:  Diagnosis Date  . Anxiety and depression    s/p suicide attempt 11/11  . Aortic insufficiency    mild by echo 10/17  . Atrial fibrillation ([place])   . Carotid stenosis, bilateral    0-39% by [place]  . Colitis   . Hepatitis C    Followed by Dr. Dallas Breeding at [facility].  s/p treatment with Bavoni.  [name] History of shingles   . HTN (hypertension)   . Hypercholesteremia   . Hypersomnia   . OSA (obstructive sleep apnea)   . Right shoulder pain   . Seasonal allergies   . Syncope and collapse   . Tobacco use   . Vitamin D deficiency    Past Surgical History:  Procedure Laterality Date  . ATRIAL FIBRILLATION ABLATION N/A 06/05/2017   Procedure: Atrial Fibrillation Ablation;  Surgeon: Thompson Grayer, MD;  Location: [place] [place] [facility];  Service: Cardiovascular;  Laterality: N/A;  . BREAST REDUCTION SURGERY  10/1999   With Dr. Towanda Malkin  . CESAREAN SECTION    . LOOP RECORDER INSERTION N/A 05/07/2017   Procedure: Loop Recorder Insertion;  Surgeon: Thompson Grayer, MD;  Location: [place] CV [facility];  Service: Cardiovascular;  Laterality: N/A;    Current Outpatient Prescriptions    Medication Sig Dispense Refill  . apixaban (ELIQUIS) 5 MG TABS tablet Take 5 mg by mouth 2 (two) times daily.    . cetirizine (ZYRTEC) 10 MG tablet Take 10 mg by mouth daily as needed for allergies.     [name] FLUoxetine (PROZAC) 20 MG tablet Take 20 mg by mouth every morning.    . fluticasone (FLONASE) 50 MCG/ACT nasal spray Place 2 sprays into both nostrils daily as needed for allergies or rhinitis.    . furosemide (LASIX) 20 MG tablet Take 1 tablet by mouth daily for the next 3 days then as needed for wt gain of 3-5lbs 10 tablet 0  . LORazepam (ATIVAN) 1 MG tablet Take 0.5-1 mg by mouth daily as needed for anxiety.     . metoprolol succinate (TOPROL-XL) 100 MG 24 hr tablet Take 50 mg by mouth daily. Take with or immediately following a meal.     . Multiple Vitamins-Minerals (EMERGEN-C IMMUNE) PACK Take 1 packet by mouth daily as needed (immune support).     . pantoprazole (PROTONIX) 40 MG tablet Take 1 tablet (40 mg total) by mouth daily. 45 tablet 0  . simvastatin (ZOCOR) 20 MG tablet Take 20 mg by mouth at bedtime.     . Tetrahydrozoline HCl (VISINE OP) Apply 1 drop to eye daily as needed (allergies).    . traZODone (DESYREL) 50 MG tablet Take 50 mg by mouth at bedtime.  No current facility-administered medications for this encounter.     Allergies  Allergen Reactions  . Cymbalta [Duloxetine Hcl] Other (See Comments)    Dizzy    Social History   Social History  . Marital status: Divorced    Spouse name: N/A  . Number of children: N/A  . Years of education: N/A   Occupational History  . Not on file.   Social History Main Topics  . Smoking status: Current Every Day Smoker    Packs/day: 1.00    Years: 30.00    Types: Cigarettes  . Smokeless tobacco: Never Used  . Alcohol use Yes  . Drug use: No  . Sexual activity: Not on file   Other Topics Concern  . Not on file   Social History Narrative  . No narrative on file    Family History  Problem Relation Age of Onset   . Atrial fibrillation Mother   . Mitral valve prolapse Mother   . Atrial fibrillation Brother   . Heart disease Maternal Grandfather     ROS- All systems are reviewed and negative except as per the HPI above  Physical Exam: Vitals:   06/18/17 1439  BP: (!) 142/82  Pulse: 60  SpO2: 92%  Weight: 176 lb 12.8 oz (80.2 kg)  Height: 5\' 1"  (1.549 m)   Wt Readings from Last 3 Encounters:  06/18/17 176 lb 12.8 oz (80.2 kg)  06/06/17 183 lb 12.8 oz (83.4 kg)  05/14/17 175 lb (79.4 kg)    Labs: Lab Results  Component Value Date   NA 137 06/18/2017   K 3.6 06/18/2017   CL 103 06/18/2017   CO2 25 06/18/2017   GLUCOSE 100 (H) 06/18/2017   BUN 9 06/18/2017   CREATININE 0.84 06/18/2017   CALCIUM 8.9 06/18/2017   No results found for: INR No results found for: CHOL, HDL, LDLCALC, TRIG   GEN- The patient is well appearing, alert and oriented x 3 today.   Head- normocephalic, atraumatic Eyes-  Sclera clear, conjunctiva pink Ears- hearing intact Oropharynx- clear Neck- supple, no JVP Lymph- no cervical lymphadenopathy Lungs- Clear to ausculation bilaterally, normal work of breathing Heart- Regular rate and rhythm, no murmurs, rubs or gallops, PMI not laterally displaced GI- soft, NT, ND, + BS Extremities- no clubbing, cyanosis, or edema MS- no significant deformity or atrophy Skin- no rash or lesion Psych- euthymic mood, full affect Neuro- strength and sensation are intact  EKG- NSR ar 60 bpm, pr int 186 ms, qrs int 70 ms, qtc 454 ms Epic records reviewed    Assessment and Plan: 1. Afib S/p ablation Is in SR Has felt mildly short of breath since ablation Diuretic has helped. Continue lasix 20 mg x 2 more days and then stop as weight is just one lb from baseline Denies any swallowing or groin issues so far bmet today  F/u 8/28 in afib clinic  Remote pacer check 8/31 Nov 5th with Dr. Rayann Heman

## 2017-06-20 ENCOUNTER — Other Ambulatory Visit: Payer: Self-pay | Admitting: Internal Medicine

## 2017-06-25 ENCOUNTER — Telehealth: Payer: Self-pay | Admitting: Internal Medicine

## 2017-06-25 NOTE — Telephone Encounter (Signed)
New Message  Pt call requesting to speak with device about her loop recorder. Pt states she had some activity on the device and would like to discuss

## 2017-06-25 NOTE — Telephone Encounter (Signed)
LMOVM returning call.  [facility] phone number for questions/concerns.

## 2017-06-26 NOTE — Telephone Encounter (Signed)
Patient reports multiple episodes of dizzy spells this week, no syncope, similar to previous symptom episodes.  6 pause episodes noted on LINQ between 8/8-8/21 per counters.  Available ECG is consistent with previous episodes (post-termination pause).  Patient is 3 weeks post-ablation and feels that her AF burden should be less at this point.  Advised patient that typically an increased AF burden is expected up to 3 months post-ablation.  Encouraged patient to call the AF clinic with new or worsening symptoms per previous recommendation from Dr. Rayann Heman.  Patient is aware of her upcoming appointment on 8/28 with Roderic Palau, NP.    Patient asks about driving restrictions.  Advised that this discussion needs to occur with a provider as patient has previously been restricted from driving due to symptomatic post-termination pause episodes.  Patient verbalizes understanding and will plan to discuss at upcoming appointment.

## 2017-06-26 NOTE — Telephone Encounter (Signed)
Patient calling wanting to know if she has had any atrial fib since her ablation. Call routed to [place].

## 2017-07-03 ENCOUNTER — Ambulatory Visit (HOSPITAL_COMMUNITY)
Admission: RE | Admit: 2017-07-03 | Discharge: 2017-07-03 | Disposition: A | Discharge status: Home / Self Care | Payer: BLUE CROSS/BLUE SHIELD | Source: Ambulatory Visit | Attending: Nurse Practitioner | Admitting: Nurse Practitioner

## 2017-07-03 VITALS — BP 120/68 | HR 77 | Ht 61.0 in | Wt 171.0 lb

## 2017-07-03 DIAGNOSIS — Z7901 Long term (current) use of anticoagulants: Secondary | ICD-10-CM | POA: Diagnosis not present

## 2017-07-03 DIAGNOSIS — F17210 Nicotine dependence, cigarettes, uncomplicated: Secondary | ICD-10-CM | POA: Diagnosis not present

## 2017-07-03 DIAGNOSIS — I4891 Unspecified atrial fibrillation: Secondary | ICD-10-CM | POA: Insufficient documentation

## 2017-07-03 DIAGNOSIS — I480 Paroxysmal atrial fibrillation: Secondary | ICD-10-CM | POA: Diagnosis not present

## 2017-07-03 NOTE — Progress Notes (Addendum)
Primary Care Physician: [facility] [place] Referring Physician: Dr. Albin Felling Theresa Reynolds is a 66 y.o. female with a h/o afib /termination pauses that recently had ablation 06/05/17. She noted some shortness of breath after the procedure. She called the office Friday with same complaints and I advised lasix 20 mg x 3 days. Her weight is now within a lb of ablation and she is now breathing better but still feels that she is not at baseline. She has been In SR. So far, no swallowing or groin issues.  F/u one month after ablation. Pt states that her shortness of breath is better but still is tired and has low energy. She felt that she should be more energetic by now. No afib that she has noted. Has had termination pauses in the past, no recent issues with this. Pending interrogation of  device 8/31.  Today, she denies symptoms of palpitations, chest pain, shortness of breath, orthopnea, PND, lower extremity edema, dizziness, presyncope, syncope, or neurologic sequela. The patient is tolerating medications without difficulties and is otherwise without complaint today.   Past Medical History:  Diagnosis Date  . Anxiety and depression    s/p suicide attempt 11/11  . Aortic insufficiency    mild by echo 10/17  . Atrial fibrillation ([place])   . Carotid stenosis, bilateral    0-39% by [place]  . Colitis   . Hepatitis C    Followed by Dr. Dallas Breeding at [facility]  s/p treatment with Bavoni.  [name] History of shingles   . HTN (hypertension)   . Hypercholesteremia   . Hypersomnia   . OSA (obstructive sleep apnea)   . Right shoulder pain   . Seasonal allergies   . Syncope and collapse   . Tobacco use   . Vitamin D deficiency    Past Surgical History:  Procedure Laterality Date  . ATRIAL FIBRILLATION ABLATION N/A 06/05/2017   Procedure: Atrial Fibrillation Ablation;  Surgeon: Thompson Grayer, MD;  Location: [place] [facility];  Service: Cardiovascular;  Laterality: N/A;    . BREAST REDUCTION SURGERY  10/1999   With Dr. Towanda Malkin  . CESAREAN SECTION    . LOOP RECORDER INSERTION N/A 05/07/2017   Procedure: Loop Recorder Insertion;  Surgeon: Thompson Grayer, MD;  Location: [place] [facility];  Service: Cardiovascular;  Laterality: N/A;    Current Outpatient Prescriptions  Medication Sig Dispense Refill  . apixaban (ELIQUIS) 5 MG TABS tablet Take 5 mg by mouth 2 (two) times daily.    . cetirizine (ZYRTEC) 10 MG tablet Take 10 mg by mouth daily as needed for allergies.     [name] FLUoxetine (PROZAC) 20 MG tablet Take 20 mg by mouth every morning.    . fluticasone (FLONASE) 50 MCG/ACT nasal spray Place 2 sprays into both nostrils daily as needed for allergies or rhinitis.    . furosemide (LASIX) 20 MG tablet Take 1 tablet by mouth daily for the next 3 days then as needed for wt gain of 3-5lbs 10 tablet 0  . metoprolol succinate (TOPROL-XL) 100 MG 24 hr tablet Take 50 mg by mouth daily. Take with or immediately following a meal.     . pantoprazole (PROTONIX) 40 MG tablet Take 1 tablet (40 mg total) by mouth daily. 45 tablet 0  . simvastatin (ZOCOR) 20 MG tablet Take 20 mg by mouth at bedtime.     . traZODone (DESYREL) 50 MG tablet Take 50 mg by mouth at bedtime.     [name]  LORazepam (ATIVAN) 1 MG tablet Take 0.5-1 mg by mouth daily as needed for anxiety.     . Multiple Vitamins-Minerals (EMERGEN-C IMMUNE) PACK Take 1 packet by mouth daily as needed (immune support).     . Tetrahydrozoline HCl (VISINE OP) Apply 1 drop to eye daily as needed (allergies).     No current facility-administered medications for this encounter.     Allergies  Allergen Reactions  . Cymbalta [Duloxetine Hcl] Other (See Comments)    Dizzy    Social History   Social History  . Marital status: Divorced    Spouse name: N/A  . Number of children: N/A  . Years of education: N/A   Occupational History  . Not on file.   Social History Main Topics  . Smoking status: Current Every Day Smoker     Packs/day: 1.00    Years: 30.00    Types: Cigarettes  . Smokeless tobacco: Never Used  . Alcohol use Yes  . Drug use: No  . Sexual activity: Not on file   Other Topics Concern  . Not on file   Social History Narrative  . No narrative on file    Family History  Problem Relation Age of Onset  . Atrial fibrillation Mother   . Mitral valve prolapse Mother   . Atrial fibrillation Brother   . Heart disease Maternal Grandfather     ROS- All systems are reviewed and negative except as per the HPI above  Physical Exam: Vitals:   07/03/17 1436  BP: 120/68  Pulse: 77  Weight: 171 lb (77.6 kg)  Height: 5\' 1"  (1.549 m)   Wt Readings from Last 3 Encounters:  07/03/17 171 lb (77.6 kg)  06/18/17 176 lb 12.8 oz (80.2 kg)  06/06/17 183 lb 12.8 oz (83.4 kg)    Labs: Lab Results  Component Value Date   NA 137 06/18/2017   K 3.6 06/18/2017   CL 103 06/18/2017   CO2 25 06/18/2017   GLUCOSE 100 (H) 06/18/2017   BUN 9 06/18/2017   CREATININE 0.84 06/18/2017   CALCIUM 8.9 06/18/2017   No results found for: INR No results found for: CHOL, HDL, LDLCALC, TRIG   GEN- The patient is well appearing, alert and oriented x 3 today.   Head- normocephalic, atraumatic Eyes-  Sclera clear, conjunctiva pink Ears- hearing intact Oropharynx- clear Neck- supple, no JVP Lymph- no cervical lymphadenopathy Lungs- Clear to ausculation bilaterally, normal work of breathing Heart- Regular rate and rhythm, no murmurs, rubs or gallops, PMI not laterally displaced GI- soft, NT, ND, + BS Extremities- no clubbing, cyanosis, or edema MS- no significant deformity or atrophy Skin- no rash or lesion Psych- euthymic mood, full affect Neuro- strength and sensation are intact  EKG- NSR at 77 bpm, pr int 178 ms, qrs int 70 ms, qtc 436 ms Epic records reviewed    Assessment and Plan: 1. Afib S/p ablation Is in SR Shortness of breath improved but still some present as well as fatigue Reassured that  these symptoms should improve with further healing Diuretic as needed Denies any swallowing or groin issues   Continue eliquis for chadsvasc score of at least 2    Remote pacer check 8/31 Nov 5th with Dr. Rayann Heman afib clinic as needed  Geroge Baseman. [address], [facility] [address] [place], [place] [address] [phone]  Addendum:8/29,I informed pt that I discussed with pt her ongoing fatigue and he suggested decreasing metoprolol. She has a remote device  check and if her overall afib burden is low,she will decrease metoprolol to 25 mg daily.

## 2017-07-05 NOTE — Addendum Note (Signed)
Encounter addended by: Sherran Needs, NP on: 07/05/2017  9:26 AM<BR>    Actions taken: Sign clinical note

## 2017-07-06 ENCOUNTER — Telehealth: Payer: Self-pay | Admitting: Internal Medicine

## 2017-07-06 ENCOUNTER — Ambulatory Visit (INDEPENDENT_AMBULATORY_CARE_PROVIDER_SITE_OTHER): Payer: BLUE CROSS/BLUE SHIELD | Admitting: *Deleted

## 2017-07-06 DIAGNOSIS — R55 Syncope and collapse: Secondary | ICD-10-CM

## 2017-07-06 DIAGNOSIS — I480 Paroxysmal atrial fibrillation: Secondary | ICD-10-CM

## 2017-07-06 NOTE — Telephone Encounter (Signed)
New message      Pt want to know when can she drive?  Also, pt said Roderic Palau had mentioned that she may need to have her toprol dosage lowered.  Please call to discuss?

## 2017-07-06 NOTE — Telephone Encounter (Signed)
Per Butch Penny - Decision to drive will have to be from Dr. Rayann Heman since was related to post-termination pauses. Butch Penny talked with patient at follow up visit this week that she may can reduce toprol dose depending what remote check scheduled for 8/31 showed for her afib burden. Once that report is available decision can be made for toprol dose reduction as patient was reporting fatigue.

## 2017-07-10 NOTE — Progress Notes (Signed)
Loop recorder summary report 

## 2017-07-11 NOTE — Telephone Encounter (Signed)
Discussed with Dr Rayann Heman and he said if she was asymptomatic she could drive but if having symptoms should not drive.  She has questions about her Toprol and reducing it but was waiting on remote from 07/06/17 to be reviewed.  I let her know I would send to device clinic to see what that showed and get back with her on Friday.

## 2017-07-11 NOTE — Telephone Encounter (Signed)
F/u Message  Pt call to f/u on her being able to drive. Please call back to discuss

## 2017-07-12 LAB — CUP PACEART REMOTE DEVICE CHECK
Date Time Interrogation Session: 20180831194021
Implantable Pulse Generator Implant Date: 20180702

## 2017-07-12 NOTE — Telephone Encounter (Signed)
Summary report from 07/06/17 completed. Apparently symptomatic with pauses.   Carelink summary report received. Battery status OK. Normal device function. 4 symptom episodes- all post-termination pauses. No tachy or brady episodes. 24 pause episodes- longest 6 seconds, ECGs appear to be post-termination of AF. 5 new AF episodes (12.6% burden) + eliquis, s/p AF ablation 06/05/17. Monthly summary reports and ROV with JA 09/10/17.

## 2017-07-13 NOTE — Telephone Encounter (Signed)
Follow up     Pt is calling to follow up about whether or not she can drive. Please call.

## 2017-07-13 NOTE — Telephone Encounter (Addendum)
Spoke with her and she is aware not to drive 6 months from the initial date of syncope.

## 2017-08-06 ENCOUNTER — Ambulatory Visit (INDEPENDENT_AMBULATORY_CARE_PROVIDER_SITE_OTHER): Payer: BLUE CROSS/BLUE SHIELD | Admitting: *Deleted

## 2017-08-06 DIAGNOSIS — R55 Syncope and collapse: Secondary | ICD-10-CM

## 2017-08-06 NOTE — Progress Notes (Signed)
Carelink Summary Report / Loop Recorder 

## 2017-08-07 LAB — CUP PACEART REMOTE DEVICE CHECK
MDC IDC PG IMPLANT DT: 20180702
MDC IDC SESS DTM: 20180930221057

## 2017-09-04 ENCOUNTER — Ambulatory Visit (INDEPENDENT_AMBULATORY_CARE_PROVIDER_SITE_OTHER): Payer: BLUE CROSS/BLUE SHIELD | Admitting: *Deleted

## 2017-09-04 DIAGNOSIS — R55 Syncope and collapse: Secondary | ICD-10-CM

## 2017-09-06 NOTE — Progress Notes (Signed)
Carelink Summary Report / Loop Recorder 

## 2017-09-07 LAB — CUP PACEART REMOTE DEVICE CHECK
MDC IDC PG IMPLANT DT: 20180702
MDC IDC SESS DTM: 20181030224107

## 2017-09-10 ENCOUNTER — Ambulatory Visit (INDEPENDENT_AMBULATORY_CARE_PROVIDER_SITE_OTHER): Payer: BLUE CROSS/BLUE SHIELD | Admitting: Internal Medicine

## 2017-09-10 ENCOUNTER — Encounter: Payer: Self-pay | Admitting: Internal Medicine

## 2017-09-10 VITALS — BP 148/76 | HR 63 | Ht 61.0 in | Wt 163.6 lb

## 2017-09-10 DIAGNOSIS — R55 Syncope and collapse: Secondary | ICD-10-CM | POA: Diagnosis not present

## 2017-09-10 DIAGNOSIS — I480 Paroxysmal atrial fibrillation: Secondary | ICD-10-CM | POA: Diagnosis not present

## 2017-09-10 DIAGNOSIS — I10 Essential (primary) hypertension: Secondary | ICD-10-CM | POA: Diagnosis not present

## 2017-09-10 DIAGNOSIS — G4733 Obstructive sleep apnea (adult) (pediatric): Secondary | ICD-10-CM

## 2017-09-10 NOTE — Patient Instructions (Addendum)
Medication Instructions:  Your physician recommends that you continue on your current medications as directed. Please refer to the Current Medication list given to you today.  -- If you need a refill on your cardiac medications before your next appointment, please call your pharmacy. --  Labwork: None ordered  Testing/Procedures: None ordered  Follow-Up: Your physician wants you to follow-up in: 3 months with Dr. Rayann Heman.  You will receive a reminder letter in the mail two months in advance. If you don't receive a letter, please call our office to schedule the follow-up appointment.  Thank you for choosing [facility]!!   Frederik Schmidt, RN [phone]  Any Other Special Instructions Will Be Listed Below (If Applicable).

## 2017-09-10 NOTE — Progress Notes (Signed)
PCP: [facility] [place] [facility] Cardiologist: Dr Claudie Leach Roque Cash PA-C  Theresa Reynolds is a 66 y.o. female who presents today for routine electrophysiology followup.  Since his recent afib ablation, the patient reports doing very well.  she denies procedure related complications and is pleased with the results of the procedure.  Today, she denies symptoms of palpitations, chest pain, shortness of breath,  lower extremity edema, dizziness, presyncope, or syncope.  The patient is otherwise without complaint today.   Past Medical History:  Diagnosis Date  . Anxiety and depression    s/p suicide attempt 11/11  . Aortic insufficiency    mild by echo 10/17  . Atrial fibrillation ([place])   . Carotid stenosis, bilateral    0-39% by [place]  . Colitis   . Hepatitis C    Followed by Dr. Dallas Reynolds at [facility].  s/p treatment with Bavoni.  [name] History of shingles   . HTN (hypertension)   . Hypercholesteremia   . Hypersomnia   . OSA (obstructive sleep apnea)   . Right shoulder pain   . Seasonal allergies   . Syncope and collapse   . Tobacco use   . Vitamin D deficiency    Past Surgical History:  Procedure Laterality Date  . BREAST REDUCTION SURGERY  10/1999   With Dr. Towanda Reynolds  . CESAREAN SECTION      ROS- all systems are personally reviewed and negatives except as per HPI above  Current Outpatient Medications  Medication Sig Dispense Refill  . albuterol (VENTOLIN HFA) 108 (90 Base) MCG/ACT inhaler Inhale 2 puffs every 6 (six) hours as needed into the lungs. Wheezing    . apixaban (ELIQUIS) 5 MG TABS tablet Take 5 mg by mouth 2 (two) times daily.    . cetirizine (ZYRTEC) 10 MG tablet Take 10 mg by mouth daily as needed for allergies.     [name] FLUoxetine (PROZAC) 20 MG tablet Take 20 mg by mouth every morning.    . fluticasone (FLONASE) 50 MCG/ACT nasal spray Place 2 sprays into both nostrils daily as needed for allergies or rhinitis.    [name] LORazepam  (ATIVAN) 1 MG tablet Take 0.5-1 mg by mouth daily as needed for anxiety.     . metoprolol succinate (TOPROL-XL) 100 MG 24 hr tablet Take 50 mg by mouth daily. Take with or immediately following a meal.     . Multiple Vitamins-Minerals (EMERGEN-C IMMUNE) PACK Take 1 packet by mouth daily as needed (immune support).     . simvastatin (ZOCOR) 20 MG tablet Take 20 mg by mouth at bedtime.     . Tetrahydrozoline HCl (VISINE OP) Apply 1 drop to eye daily as needed (allergies).    . traZODone (DESYREL) 50 MG tablet Take 50 mg by mouth at bedtime.      No current facility-administered medications for this visit.     Physical Exam: Vitals:   09/10/17 0941  BP: (!) 148/76  Pulse: 63  SpO2: 96%  Weight: 163 lb 9.6 oz (74.2 kg)  Height: 5\' 1"  (1.549 m)    GEN- The patient is well appearing, alert and oriented x 3 today.   Head- normocephalic, atraumatic Eyes-  Sclera clear, conjunctiva pink Ears- hearing intact Oropharynx- clear Lungs- Clear to ausculation bilaterally, normal work of breathing Heart- Regular rate and rhythm, no murmurs, rubs or gallops, PMI not laterally displaced GI- soft, NT, ND, + BS Extremities- no clubbing, cyanosis, or edema  EKG tracing ordered today  is personally reviewed and shows sinus rhythm, LAA, otherwise normal ekg  Assessment and Plan:  1. Paroxysmal atrial fibrillation/ atrial flutter Doing well s/p ablation chads2vasc score is 2 Continue anticoagulation  2. Syncope/ post termination pauses Appear to have resolved with resolution of ERAF Will follow clinically with ILR (ILR interrogation is reviewed at length today).  I think she can drive as she has had no pauses since 07/08/17.  If pauses return, we will restrict her driving.  3. HTN Stable No change required today  4. OSA Compliant with CPAP  5. Tobacco Cessation advised  Return to see me in 3 months  Thompson Grayer MD, [facility] 09/10/2017 9:59 AM

## 2017-09-11 ENCOUNTER — Other Ambulatory Visit: Payer: Self-pay | Admitting: Internal Medicine

## 2017-09-13 LAB — CUP PACEART INCLINIC DEVICE CHECK
Date Time Interrogation Session: 20181105155352
MDC IDC PG IMPLANT DT: 20180702

## 2017-10-04 ENCOUNTER — Ambulatory Visit (INDEPENDENT_AMBULATORY_CARE_PROVIDER_SITE_OTHER): Payer: BLUE CROSS/BLUE SHIELD | Admitting: *Deleted

## 2017-10-04 DIAGNOSIS — R55 Syncope and collapse: Secondary | ICD-10-CM

## 2017-10-05 NOTE — Progress Notes (Signed)
Carelink Summary Report / Loop Recorder 

## 2017-10-18 LAB — CUP PACEART REMOTE DEVICE CHECK
Implantable Pulse Generator Implant Date: 20180702
MDC IDC SESS DTM: 20181129231033

## 2017-11-05 ENCOUNTER — Ambulatory Visit (INDEPENDENT_AMBULATORY_CARE_PROVIDER_SITE_OTHER): Payer: BLUE CROSS/BLUE SHIELD | Admitting: *Deleted

## 2017-11-05 DIAGNOSIS — R55 Syncope and collapse: Secondary | ICD-10-CM

## 2017-11-05 NOTE — Progress Notes (Signed)
Carelink Summary Report / Loop Recorder 

## 2017-11-14 DIAGNOSIS — I4891 Unspecified atrial fibrillation: Secondary | ICD-10-CM | POA: Diagnosis not present

## 2017-11-14 DIAGNOSIS — G4733 Obstructive sleep apnea (adult) (pediatric): Secondary | ICD-10-CM | POA: Diagnosis not present

## 2017-11-14 DIAGNOSIS — B182 Chronic viral hepatitis C: Secondary | ICD-10-CM | POA: Diagnosis not present

## 2017-11-14 DIAGNOSIS — I272 Pulmonary hypertension, unspecified: Secondary | ICD-10-CM | POA: Diagnosis not present

## 2017-11-15 LAB — CUP PACEART REMOTE DEVICE CHECK
MDC IDC PG IMPLANT DT: 20180702
MDC IDC SESS DTM: 20181230003916

## 2017-11-22 DIAGNOSIS — R945 Abnormal results of liver function studies: Secondary | ICD-10-CM | POA: Diagnosis not present

## 2017-11-22 DIAGNOSIS — B182 Chronic viral hepatitis C: Secondary | ICD-10-CM | POA: Diagnosis not present

## 2017-12-03 ENCOUNTER — Ambulatory Visit (INDEPENDENT_AMBULATORY_CARE_PROVIDER_SITE_OTHER): Payer: Medicare Other | Admitting: *Deleted

## 2017-12-03 DIAGNOSIS — R55 Syncope and collapse: Secondary | ICD-10-CM

## 2017-12-04 NOTE — Progress Notes (Signed)
Carelink Summary Report / Loop Recorder 

## 2017-12-06 DIAGNOSIS — G4733 Obstructive sleep apnea (adult) (pediatric): Secondary | ICD-10-CM | POA: Diagnosis not present

## 2017-12-06 DIAGNOSIS — R0902 Hypoxemia: Secondary | ICD-10-CM | POA: Diagnosis not present

## 2017-12-10 ENCOUNTER — Ambulatory Visit (INDEPENDENT_AMBULATORY_CARE_PROVIDER_SITE_OTHER): Payer: Medicare Other | Admitting: Internal Medicine

## 2017-12-10 ENCOUNTER — Encounter: Payer: Self-pay | Admitting: Internal Medicine

## 2017-12-10 VITALS — BP 122/66 | HR 67 | Ht 61.0 in | Wt 166.0 lb

## 2017-12-10 DIAGNOSIS — I480 Paroxysmal atrial fibrillation: Secondary | ICD-10-CM | POA: Diagnosis not present

## 2017-12-10 DIAGNOSIS — I10 Essential (primary) hypertension: Secondary | ICD-10-CM | POA: Diagnosis not present

## 2017-12-10 DIAGNOSIS — G4733 Obstructive sleep apnea (adult) (pediatric): Secondary | ICD-10-CM

## 2017-12-10 DIAGNOSIS — R55 Syncope and collapse: Secondary | ICD-10-CM | POA: Diagnosis not present

## 2017-12-10 LAB — CUP PACEART INCLINIC DEVICE CHECK
Implantable Pulse Generator Implant Date: 20180702
MDC IDC SESS DTM: 20190204145530

## 2017-12-10 MED ORDER — APIXABAN 5 MG PO TABS: 5.0000 mg | ORAL_TABLET | Freq: Two times a day (BID) | ORAL | 11 refills | Status: DC

## 2017-12-10 MED ORDER — METOPROLOL SUCCINATE ER 25 MG PO TB24: 25.0000 mg | ORAL_TABLET | Freq: Every day | ORAL | 3 refills | Status: DC

## 2017-12-10 NOTE — Progress Notes (Signed)
PCP: [facility] @ [place] [facility] Cardiologist: Theresa Reynolds Dr Theresa Reynolds Primary EP: Dr Theresa Reynolds  Theresa Reynolds Theresa Reynolds is a 67 y.o. female who presents today for routine electrophysiology followup.  Since last being seen in our clinic, the patient reports doing very well.  Today, she denies symptoms of palpitations, chest pain, shortness of breath,  lower extremity edema, dizziness, presyncope, or syncope.  The patient is otherwise without complaint today.   Past Medical History:  Diagnosis Date  . Anxiety and depression    s/p suicide attempt 11/11  . Aortic insufficiency    mild by echo 10/17  . Atrial fibrillation ([place])   . Carotid stenosis, bilateral    0-39% by [place]  . Colitis   . Hepatitis C    Followed by [facility]. Dallas Reynolds at [facility]  s/p treatment with Bavoni.  Theresa Reynolds History of shingles   . HTN (hypertension)   . Hypercholesteremia   . Hypersomnia   . OSA (obstructive sleep apnea)   . Right shoulder pain   . Seasonal allergies   . Syncope and collapse   . Tobacco use   . Vitamin D deficiency    Past Surgical History:  Procedure Laterality Date  . ATRIAL FIBRILLATION ABLATION N/A 06/05/2017   Procedure: Atrial Fibrillation Ablation;  Surgeon: Thompson Grayer, MD;  Location: [place] [facility];  Service: Cardiovascular;  Laterality: N/A;  . BREAST REDUCTION SURGERY  10/1999   With Dr. Towanda Malkin  . CESAREAN SECTION    . LOOP RECORDER INSERTION N/A 05/07/2017   Procedure: Loop Recorder Insertion;  Surgeon: Thompson Grayer, MD;  Location: [place] [facility];  Service: Cardiovascular;  Laterality: N/A;    ROS- all systems are reviewed and negatives except as per HPI above  Current Outpatient Medications  Medication Sig Dispense Refill  . albuterol (VENTOLIN HFA) 108 (90 Base) MCG/ACT inhaler Inhale 2 puffs every 6 (six) hours as needed into the lungs. Wheezing    . apixaban (ELIQUIS) 5 MG TABS tablet Take 5 mg by mouth 2 (two) times daily.    . cetirizine  (ZYRTEC) 10 MG tablet Take 10 mg by mouth daily as needed for allergies.     Theresa Reynolds FLUoxetine (PROZAC) 20 MG tablet Take 20 mg by mouth every morning.    . fluticasone (FLONASE) 50 MCG/ACT nasal spray Place 2 sprays into both nostrils daily as needed for allergies or rhinitis.    Theresa Reynolds LORazepam (ATIVAN) 1 MG tablet Take 0.5-1 mg by mouth daily as needed for anxiety.     . metoprolol succinate (TOPROL-XL) 100 MG 24 hr tablet Take 50 mg by mouth daily. Take with or immediately following a meal.     . Multiple Vitamins-Minerals (EMERGEN-C IMMUNE) PACK Take 1 packet by mouth daily as needed (immune support).     . sertraline (ZOLOFT) 50 MG tablet Take 50 mg by mouth daily.  0  . simvastatin (ZOCOR) 20 MG tablet Take 20 mg by mouth at bedtime.     . Tetrahydrozoline HCl (VISINE OP) Apply 1 drop to eye daily as needed (allergies).    . traZODone (DESYREL) 50 MG tablet Take 50 mg by mouth at bedtime.      No current facility-administered medications for this visit.     Physical Exam: Vitals:   12/10/17 1017  BP: 122/66  Pulse: 67  Weight: 166 lb (75.3 kg)  Height: 5\' 1"  (1.549 m)    GEN- The patient is well appearing, alert and oriented x  3 today.   Head- normocephalic, atraumatic Eyes-  Sclera clear, conjunctiva pink Ears- hearing intact Oropharynx- clear Lungs- Clear to ausculation bilaterally, normal work of breathing Heart- Regular rate and rhythm, no murmurs, rubs or gallops, PMI not laterally displaced GI- soft, NT, ND, + BS Extremities- no clubbing, cyanosis, or edema  ILR interrogation today is personally reviewed and shows no afib  Assessment and Plan:  1. Paroxysmal atrial fibrillation/ atrial flutter No afib post ablation off AAD therapy chads2vasc score is 2.  Continue on eliquis Reduce toprol XL from 50mg  daily to 25mg  daily  2. Syncope/ post termination pauses Resolved post ablation  3. HTN Stable No change required today Reduce toprol as above  4. Tobacco Cessation  is advised She still smokes  5. OSA Compliance with CPAP is encouraged  6. Obesity Body mass index is 31.37 kg/m. Weight loss is advised  Return to see me in 6 months Carelink  Thompson Grayer MD, [facility] 12/10/2017 10:31 AM

## 2017-12-10 NOTE — Patient Instructions (Signed)
Medication Instructions:  Your physician has recommended you make the following change in your medication:  1.  Reduce your Toprol XL.  Start taking Toprol XL 25 mg one tablet by mouth daily.  Labwork: None ordered.  Testing/Procedures: None ordered.  Follow-Up:  Your physician wants you to follow-up in: 6 months with Dr. Rayann Heman.  You will receive a reminder letter in the mail two months in advance. If you don't receive a letter, please call our office to schedule the follow-up appointment.  Remote monitoring is used to monitor your Pacemaker from home. This monitoring reduces the number of office visits required to check your device to one time per year. It allows [place] to keep an eye on the functioning of your device to ensure it is working properly. You are scheduled for a device check from home on 01/07/2018. You may send your transmission at any time that day. If you have a wireless device, the transmission will be sent automatically. After your physician reviews your transmission, you will receive a postcard with your next transmission date.  Any Other Special Instructions Will Be Listed Below (If Applicable).   If you need a refill on your cardiac medications before your next appointment, please call your pharmacy.

## 2017-12-18 LAB — CUP PACEART REMOTE DEVICE CHECK
Date Time Interrogation Session: 20190129010910
Implantable Pulse Generator Implant Date: 20180702

## 2017-12-24 DIAGNOSIS — I272 Pulmonary hypertension, unspecified: Secondary | ICD-10-CM | POA: Diagnosis not present

## 2017-12-24 DIAGNOSIS — J309 Allergic rhinitis, unspecified: Secondary | ICD-10-CM | POA: Diagnosis not present

## 2017-12-24 DIAGNOSIS — I4891 Unspecified atrial fibrillation: Secondary | ICD-10-CM | POA: Diagnosis not present

## 2017-12-24 DIAGNOSIS — G4733 Obstructive sleep apnea (adult) (pediatric): Secondary | ICD-10-CM | POA: Diagnosis not present

## 2018-01-07 ENCOUNTER — Ambulatory Visit (INDEPENDENT_AMBULATORY_CARE_PROVIDER_SITE_OTHER): Payer: Medicare Other | Admitting: *Deleted

## 2018-01-07 DIAGNOSIS — R55 Syncope and collapse: Secondary | ICD-10-CM

## 2018-01-07 NOTE — Progress Notes (Signed)
Carelink Summary Report / Loop Recorder 

## 2018-01-30 DIAGNOSIS — M79641 Pain in right hand: Secondary | ICD-10-CM | POA: Insufficient documentation

## 2018-01-30 DIAGNOSIS — G5601 Carpal tunnel syndrome, right upper limb: Secondary | ICD-10-CM | POA: Diagnosis not present

## 2018-01-30 DIAGNOSIS — M79642 Pain in left hand: Secondary | ICD-10-CM | POA: Diagnosis not present

## 2018-01-30 DIAGNOSIS — G5602 Carpal tunnel syndrome, left upper limb: Secondary | ICD-10-CM | POA: Diagnosis not present

## 2018-01-31 ENCOUNTER — Encounter: Payer: Self-pay | Admitting: Internal Medicine

## 2018-02-07 ENCOUNTER — Ambulatory Visit (INDEPENDENT_AMBULATORY_CARE_PROVIDER_SITE_OTHER): Payer: Medicare Other | Admitting: *Deleted

## 2018-02-07 DIAGNOSIS — R55 Syncope and collapse: Secondary | ICD-10-CM | POA: Diagnosis not present

## 2018-02-11 NOTE — Progress Notes (Signed)
Carelink Summary Report / Loop Recorder 

## 2018-02-15 LAB — CUP PACEART REMOTE DEVICE CHECK
Date Time Interrogation Session: 20190303013849
MDC IDC PG IMPLANT DT: 20180702

## 2018-03-12 ENCOUNTER — Ambulatory Visit (INDEPENDENT_AMBULATORY_CARE_PROVIDER_SITE_OTHER): Payer: Medicare Other | Admitting: *Deleted

## 2018-03-12 DIAGNOSIS — R55 Syncope and collapse: Secondary | ICD-10-CM | POA: Diagnosis not present

## 2018-03-13 LAB — CUP PACEART REMOTE DEVICE CHECK
Implantable Pulse Generator Implant Date: 20180702
MDC IDC SESS DTM: 20190405020531

## 2018-03-13 NOTE — Progress Notes (Signed)
Carelink Summary Report / Loop Recorder 

## 2018-03-25 DIAGNOSIS — I272 Pulmonary hypertension, unspecified: Secondary | ICD-10-CM | POA: Diagnosis not present

## 2018-03-25 DIAGNOSIS — I4891 Unspecified atrial fibrillation: Secondary | ICD-10-CM | POA: Diagnosis not present

## 2018-03-25 DIAGNOSIS — G4733 Obstructive sleep apnea (adult) (pediatric): Secondary | ICD-10-CM | POA: Diagnosis not present

## 2018-03-25 DIAGNOSIS — J309 Allergic rhinitis, unspecified: Secondary | ICD-10-CM | POA: Diagnosis not present

## 2018-04-02 LAB — CUP PACEART REMOTE DEVICE CHECK
Date Time Interrogation Session: 20190508023602
MDC IDC PG IMPLANT DT: 20180702

## 2018-04-15 ENCOUNTER — Ambulatory Visit (INDEPENDENT_AMBULATORY_CARE_PROVIDER_SITE_OTHER): Payer: Medicare Other | Admitting: *Deleted

## 2018-04-15 DIAGNOSIS — R55 Syncope and collapse: Secondary | ICD-10-CM | POA: Diagnosis not present

## 2018-04-15 NOTE — Progress Notes (Signed)
Carelink Summary Report / Loop Recorder 

## 2018-05-20 ENCOUNTER — Ambulatory Visit (INDEPENDENT_AMBULATORY_CARE_PROVIDER_SITE_OTHER): Payer: Medicare Other | Admitting: *Deleted

## 2018-05-20 DIAGNOSIS — R55 Syncope and collapse: Secondary | ICD-10-CM

## 2018-05-20 NOTE — Progress Notes (Signed)
Carelink Summary Report / Loop Recorder 

## 2018-05-21 LAB — CUP PACEART REMOTE DEVICE CHECK
MDC IDC PG IMPLANT DT: 20180702
MDC IDC SESS DTM: 20190610033915

## 2018-06-20 ENCOUNTER — Ambulatory Visit (INDEPENDENT_AMBULATORY_CARE_PROVIDER_SITE_OTHER): Payer: Medicare Other | Admitting: *Deleted

## 2018-06-20 DIAGNOSIS — R55 Syncope and collapse: Secondary | ICD-10-CM | POA: Diagnosis not present

## 2018-06-20 NOTE — Progress Notes (Signed)
Carelink Summary Report / Loop Recorder 

## 2018-07-08 LAB — CUP PACEART REMOTE DEVICE CHECK
Date Time Interrogation Session: 20190713033725
Implantable Pulse Generator Implant Date: 20180702

## 2018-07-23 ENCOUNTER — Ambulatory Visit (INDEPENDENT_AMBULATORY_CARE_PROVIDER_SITE_OTHER): Payer: Medicare Other | Admitting: *Deleted

## 2018-07-23 DIAGNOSIS — R55 Syncope and collapse: Secondary | ICD-10-CM

## 2018-07-23 NOTE — Progress Notes (Signed)
Carelink Summary Report / Loop Recorder 

## 2018-07-29 LAB — CUP PACEART REMOTE DEVICE CHECK
Implantable Pulse Generator Implant Date: 20180702
MDC IDC SESS DTM: 20190815043959

## 2018-08-04 LAB — CUP PACEART REMOTE DEVICE CHECK
Implantable Pulse Generator Implant Date: 20180702
MDC IDC SESS DTM: 20190917121043

## 2018-08-07 ENCOUNTER — Ambulatory Visit (INDEPENDENT_AMBULATORY_CARE_PROVIDER_SITE_OTHER): Payer: Medicare Other | Admitting: Internal Medicine

## 2018-08-07 ENCOUNTER — Encounter: Payer: Self-pay | Admitting: Internal Medicine

## 2018-08-07 VITALS — BP 124/70 | HR 56 | Ht 61.0 in | Wt 166.2 lb

## 2018-08-07 DIAGNOSIS — R55 Syncope and collapse: Secondary | ICD-10-CM

## 2018-08-07 DIAGNOSIS — I480 Paroxysmal atrial fibrillation: Secondary | ICD-10-CM

## 2018-08-07 DIAGNOSIS — G4733 Obstructive sleep apnea (adult) (pediatric): Secondary | ICD-10-CM | POA: Diagnosis not present

## 2018-08-07 DIAGNOSIS — Z23 Encounter for immunization: Secondary | ICD-10-CM | POA: Diagnosis not present

## 2018-08-07 DIAGNOSIS — I10 Essential (primary) hypertension: Secondary | ICD-10-CM | POA: Diagnosis not present

## 2018-08-07 NOTE — Patient Instructions (Addendum)
Medication Instructions:  Your physician recommends that you continue on your current medications as directed. Please refer to the Current Medication list given to you today.  Labwork: None ordered.  Testing/Procedures: None ordered.  Follow-Up:  Your physician wants you to follow-up in: 6 months with Chanetta Marshall, NP.   You will receive a reminder letter in the mail two months in advance. If you don't receive a letter, please call our office to schedule the follow-up appointment.  Your physician wants you to follow-up in: one year with Dr. Rayann Heman.   You will receive a reminder letter in the mail two months in advance. If you don't receive a letter, please call our office to schedule the follow-up appointment.   Any Other Special Instructions Will Be Listed Below (If Applicable).  If you need a refill on your cardiac medications before your next appointment, please call your pharmacy.

## 2018-08-07 NOTE — Progress Notes (Signed)
PCP: [facility] @ [place] Primary Cardiologist: Dr Claudie Leach Roque Cash Primary EP: Dr Albin Felling Theresa Reynolds is a 67 y.o. female who presents today for routine electrophysiology followup.  Since last being seen in our clinic, the patient reports doing very well.  Today, she denies symptoms of palpitations, chest pain, shortness of breath,  lower extremity edema, dizziness, presyncope, or syncope. She recently had blurred vision, now resolved.  I have advised that she follow-up with her opthomologist for this. The patient is otherwise without complaint today.   Past Medical History:  Diagnosis Date  . Anxiety and depression    s/p suicide attempt 11/11  . Aortic insufficiency    mild by echo 10/17  . Atrial fibrillation ([place])   . Carotid stenosis, bilateral    0-39% by [place]  . Colitis   . Hepatitis C    Followed by Dr. Dallas Breeding at [facility]  s/p treatment with Bavoni.  [name] History of shingles   . HTN (hypertension)   . Hypercholesteremia   . Hypersomnia   . OSA (obstructive sleep apnea)   . Right shoulder pain   . Seasonal allergies   . Syncope and collapse   . Tobacco use   . Vitamin D deficiency    Past Surgical History:  Procedure Laterality Date  . ATRIAL FIBRILLATION ABLATION N/A 06/05/2017   Procedure: Atrial Fibrillation Ablation;  Surgeon: Thompson Grayer, MD;  Location: [place] [facility];  Service: Cardiovascular;  Laterality: N/A;  . BREAST REDUCTION SURGERY  10/1999   With Dr. Towanda Malkin  . CESAREAN SECTION    . LOOP RECORDER INSERTION N/A 05/07/2017   Procedure: Loop Recorder Insertion;  Surgeon: Thompson Grayer, MD;  Location: [place] [facility];  Service: Cardiovascular;  Laterality: N/A;    ROS- all systems are reviewed and negatives except as per HPI above  Current Outpatient Medications  Medication Sig Dispense Refill  . apixaban (ELIQUIS) 5 MG TABS tablet Take 1 tablet (5 mg total) by mouth 2 (two) times daily. 60 tablet 11  .  cetirizine (ZYRTEC) 10 MG tablet Take 10 mg by mouth daily as needed for allergies.     . fluticasone (FLONASE) 50 MCG/ACT nasal spray Place 2 sprays into both nostrils daily as needed for allergies or rhinitis.    [name] LORazepam (ATIVAN) 1 MG tablet Take 0.5-1 mg by mouth daily as needed for anxiety.     . metoprolol succinate (TOPROL XL) 25 MG 24 hr tablet Take 1 tablet (25 mg total) by mouth daily. 90 tablet 3  . Multiple Vitamins-Minerals (EMERGEN-C IMMUNE) PACK Take 1 packet by mouth daily as needed (immune support).     . sertraline (ZOLOFT) 50 MG tablet Take 50 mg by mouth daily.  0  . simvastatin (ZOCOR) 20 MG tablet Take 20 mg by mouth at bedtime.     . Tetrahydrozoline HCl (VISINE OP) Apply 1 drop to eye daily as needed (allergies).    . traZODone (DESYREL) 50 MG tablet Take 50 mg by mouth at bedtime.      No current facility-administered medications for this visit.     Physical Exam: Vitals:   08/07/18 1223  BP: 124/70  Pulse: (!) 56  SpO2: 93%  Weight: 166 lb 3.2 oz (75.4 kg)  Height: 5\' 1"  (1.549 m)    GEN- The patient is well appearing, alert and oriented x 3 today.   Head- normocephalic, atraumatic Eyes-  Sclera clear, conjunctiva pink Ears- hearing intact  Oropharynx- clear Lungs- Clear to ausculation bilaterally, normal work of breathing Heart- Regular rate and rhythm, no murmurs, rubs or gallops, PMI not laterally displaced GI- soft, NT, ND, + BS Extremities- no clubbing, cyanosis, or edema Neuro- CN II-XII intact, strength/ sensation intact  Wt Readings from Last 3 Encounters:  08/07/18 166 lb 3.2 oz (75.4 kg)  12/10/17 166 lb (75.3 kg)  09/10/17 163 lb 9.6 oz (74.2 kg)    EKG tracing ordered today is personally reviewed and shows sinus rhythm 56 bpm, PR 202 msec, Qtc 424 msec  Assessment and Plan:  1. Paroxysmal atrial fibrillation/ atrial flutter Doing well post ablation off AAD therapy afib burden by ILR is 0% chads2vasc score is 3.  Continue  eliquis  2. HTN Stable No change required today  3. Post termination pauses/ syncope Resolved post ablation  4. Tobacco Cessation advised  5. OSA Compliance with CPAP encouraged  6. Overweight Body mass index is 31.4 kg/m.   carelink Return in 6 months to see EP NP  Thompson Grayer MD, [facility] 08/07/2018 12:40 PM

## 2018-08-26 ENCOUNTER — Ambulatory Visit (INDEPENDENT_AMBULATORY_CARE_PROVIDER_SITE_OTHER): Payer: Medicare Other | Admitting: *Deleted

## 2018-08-26 DIAGNOSIS — R55 Syncope and collapse: Secondary | ICD-10-CM

## 2018-08-26 NOTE — Progress Notes (Signed)
Carelink Summary Report / Loop Recorder 

## 2018-09-12 LAB — CUP PACEART INCLINIC DEVICE CHECK
MDC IDC PG IMPLANT DT: 20180702
MDC IDC SESS DTM: 20191002162222

## 2018-09-13 LAB — CUP PACEART REMOTE DEVICE CHECK
Date Time Interrogation Session: 20191020123857
Implantable Pulse Generator Implant Date: 20180702

## 2018-09-16 ENCOUNTER — Other Ambulatory Visit: Payer: Self-pay | Admitting: Family Medicine

## 2018-09-16 DIAGNOSIS — Z1231 Encounter for screening mammogram for malignant neoplasm of breast: Secondary | ICD-10-CM

## 2018-09-18 ENCOUNTER — Ambulatory Visit
Admission: RE | Admit: 2018-09-18 | Discharge: 2018-09-18 | Disposition: A | Discharge status: Home / Self Care | Payer: Medicare Other | Source: Ambulatory Visit | Attending: Family Medicine | Admitting: Family Medicine

## 2018-09-18 DIAGNOSIS — Z1231 Encounter for screening mammogram for malignant neoplasm of breast: Secondary | ICD-10-CM | POA: Diagnosis not present

## 2018-09-27 ENCOUNTER — Ambulatory Visit (INDEPENDENT_AMBULATORY_CARE_PROVIDER_SITE_OTHER): Payer: Medicare Other

## 2018-09-27 DIAGNOSIS — R55 Syncope and collapse: Secondary | ICD-10-CM

## 2018-09-27 NOTE — Progress Notes (Signed)
Carelink Summary Report / Loop Recorder 

## 2018-10-14 DIAGNOSIS — F5101 Primary insomnia: Secondary | ICD-10-CM | POA: Diagnosis not present

## 2018-10-14 DIAGNOSIS — F17210 Nicotine dependence, cigarettes, uncomplicated: Secondary | ICD-10-CM | POA: Diagnosis not present

## 2018-10-14 DIAGNOSIS — Z0000 Encounter for general adult medical examination without abnormal findings: Secondary | ICD-10-CM | POA: Diagnosis not present

## 2018-10-14 DIAGNOSIS — Z23 Encounter for immunization: Secondary | ICD-10-CM | POA: Diagnosis not present

## 2018-10-14 DIAGNOSIS — I10 Essential (primary) hypertension: Secondary | ICD-10-CM | POA: Diagnosis not present

## 2018-10-14 DIAGNOSIS — Z8619 Personal history of other infectious and parasitic diseases: Secondary | ICD-10-CM | POA: Diagnosis not present

## 2018-10-14 DIAGNOSIS — I480 Paroxysmal atrial fibrillation: Secondary | ICD-10-CM | POA: Diagnosis not present

## 2018-10-14 DIAGNOSIS — G4733 Obstructive sleep apnea (adult) (pediatric): Secondary | ICD-10-CM | POA: Diagnosis not present

## 2018-10-14 DIAGNOSIS — E782 Mixed hyperlipidemia: Secondary | ICD-10-CM | POA: Diagnosis not present

## 2018-10-14 DIAGNOSIS — F419 Anxiety disorder, unspecified: Secondary | ICD-10-CM | POA: Diagnosis not present

## 2018-10-15 DIAGNOSIS — E782 Mixed hyperlipidemia: Secondary | ICD-10-CM | POA: Diagnosis not present

## 2018-10-15 DIAGNOSIS — Z8619 Personal history of other infectious and parasitic diseases: Secondary | ICD-10-CM | POA: Diagnosis not present

## 2018-10-15 DIAGNOSIS — G4733 Obstructive sleep apnea (adult) (pediatric): Secondary | ICD-10-CM | POA: Diagnosis not present

## 2018-10-15 DIAGNOSIS — F5101 Primary insomnia: Secondary | ICD-10-CM | POA: Diagnosis not present

## 2018-10-15 DIAGNOSIS — Z23 Encounter for immunization: Secondary | ICD-10-CM | POA: Diagnosis not present

## 2018-10-15 DIAGNOSIS — F419 Anxiety disorder, unspecified: Secondary | ICD-10-CM | POA: Diagnosis not present

## 2018-10-15 DIAGNOSIS — Z0000 Encounter for general adult medical examination without abnormal findings: Secondary | ICD-10-CM | POA: Diagnosis not present

## 2018-10-15 DIAGNOSIS — F17210 Nicotine dependence, cigarettes, uncomplicated: Secondary | ICD-10-CM | POA: Diagnosis not present

## 2018-10-15 DIAGNOSIS — I480 Paroxysmal atrial fibrillation: Secondary | ICD-10-CM | POA: Diagnosis not present

## 2018-10-15 DIAGNOSIS — I10 Essential (primary) hypertension: Secondary | ICD-10-CM | POA: Diagnosis not present

## 2018-10-16 DIAGNOSIS — G5602 Carpal tunnel syndrome, left upper limb: Secondary | ICD-10-CM | POA: Diagnosis not present

## 2018-10-16 DIAGNOSIS — G5603 Carpal tunnel syndrome, bilateral upper limbs: Secondary | ICD-10-CM | POA: Diagnosis not present

## 2018-10-16 DIAGNOSIS — M79642 Pain in left hand: Secondary | ICD-10-CM | POA: Diagnosis not present

## 2018-10-16 DIAGNOSIS — M79641 Pain in right hand: Secondary | ICD-10-CM | POA: Diagnosis not present

## 2018-10-16 DIAGNOSIS — G5601 Carpal tunnel syndrome, right upper limb: Secondary | ICD-10-CM | POA: Diagnosis not present

## 2018-10-20 DIAGNOSIS — J069 Acute upper respiratory infection, unspecified: Secondary | ICD-10-CM | POA: Diagnosis not present

## 2018-10-29 DIAGNOSIS — R05 Cough: Secondary | ICD-10-CM | POA: Diagnosis not present

## 2018-10-29 DIAGNOSIS — J069 Acute upper respiratory infection, unspecified: Secondary | ICD-10-CM | POA: Diagnosis not present

## 2018-10-31 ENCOUNTER — Ambulatory Visit (INDEPENDENT_AMBULATORY_CARE_PROVIDER_SITE_OTHER): Payer: Medicare Other

## 2018-10-31 DIAGNOSIS — R55 Syncope and collapse: Secondary | ICD-10-CM

## 2018-10-31 LAB — CUP PACEART REMOTE DEVICE CHECK
Implantable Pulse Generator Implant Date: 20180702
MDC IDC SESS DTM: 20191225133733

## 2018-10-31 NOTE — Progress Notes (Signed)
Carelink Summary Report / Loop Recorder 

## 2018-11-17 LAB — CUP PACEART REMOTE DEVICE CHECK
Date Time Interrogation Session: 20191122131137
MDC IDC PG IMPLANT DT: 20180702

## 2018-11-27 DIAGNOSIS — B182 Chronic viral hepatitis C: Secondary | ICD-10-CM | POA: Diagnosis not present

## 2018-11-27 DIAGNOSIS — Z79899 Other long term (current) drug therapy: Secondary | ICD-10-CM | POA: Diagnosis not present

## 2018-11-28 ENCOUNTER — Other Ambulatory Visit: Payer: Self-pay | Admitting: Internal Medicine

## 2018-11-28 IMAGING — CT CT HEART MORPH/PULM VEIN W/ CM & W/O CA SCORE
2 of 6 series · 10 of 20 positions shown, 12 images · IV contrast (APPLIED)
Comparison: None.
COMPARISON: None

EXAM:
OVER-READ INTERPRETATION  CT CHEST

The following report is an over-read performed by radiologist Dr.
over-read does not include interpretation of cardiac or coronary
anatomy or pathology. The cardiac CTA interpretation by the
cardiologist is attached.
CLINICAL DATA: Pre Ablation
Cardiac Gated CTA
TECHNIQUE: The patient was scanned on a Siemens Force [REDACTED]ice scanner. Gantry
rotation speed was 250 msec with a temporal resolution of 66 msec. A
prospective scan was triggered in the ascending thoracic aorta at
140 HU's Data sets were reconstructed with full mA between 35% and
75% of the R-R interval Images were reviewed using VRT, MIP and MPR
modes. Double oblique images were used to measure the PV diameter
and areas. The patient received 80 cc of contrast at 5 cc/sec
CONTRAST:  Isovue 370 total 80 cc

[Series 8: best diast · axial · 0.37mm/px · z∈[+1378,+1497]mm · 5 of 447 slices shown, 7 images]
[im 75/447  vessel]
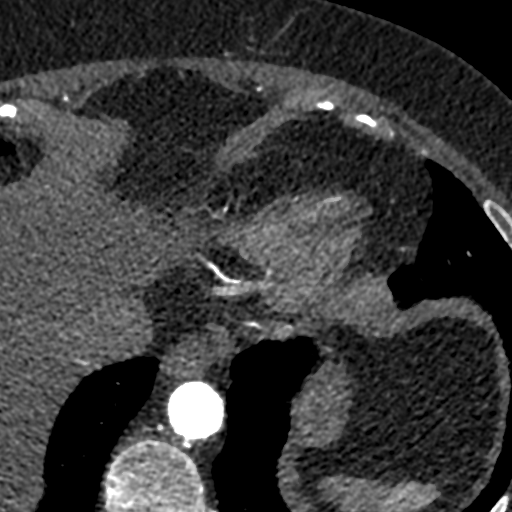
[im 75/447  lung]
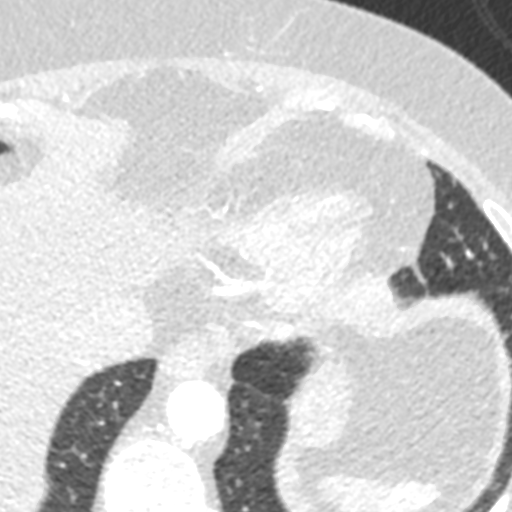
[im 149/447  vessel]
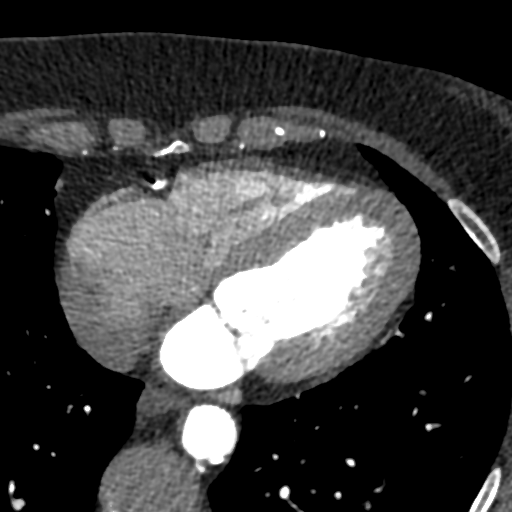
[im 224/447  vessel]
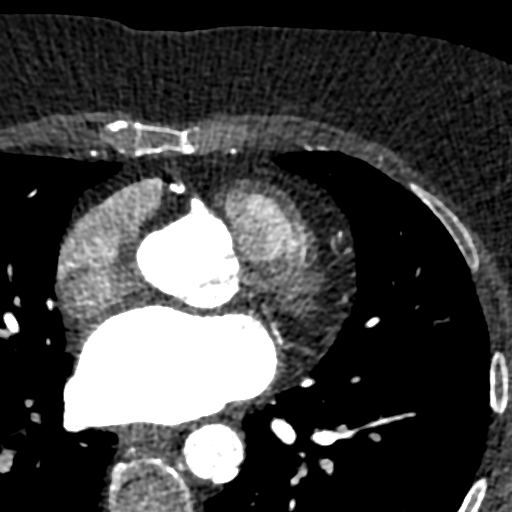
[im 298/447  vessel]
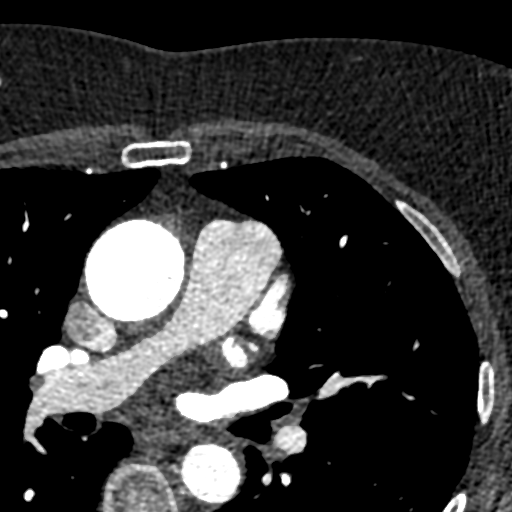
[im 372/447  vessel]
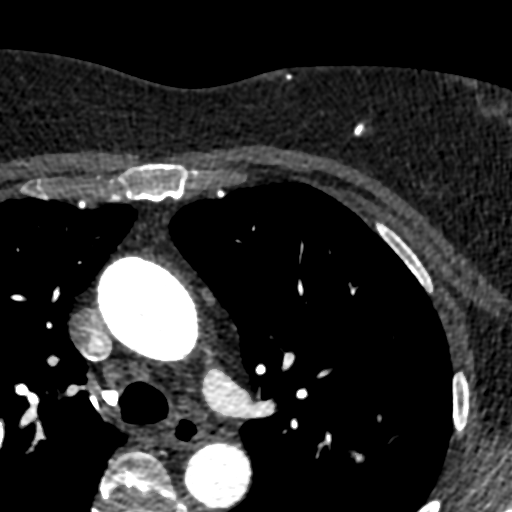
[im 372/447  lung]
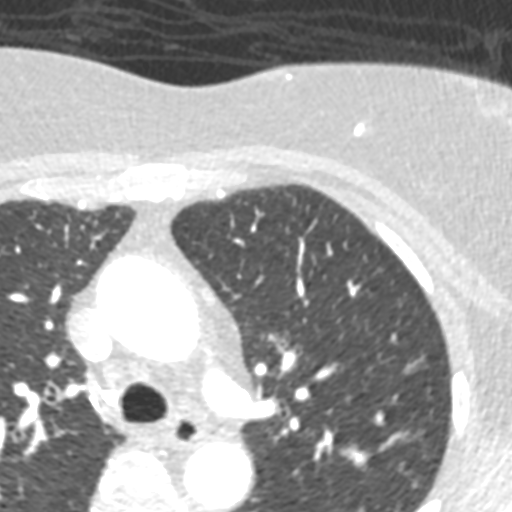

[Series 9: +300 ms · axial · 0.37mm/px · z∈[+1378,+1497]mm · 5 of 447 slices shown]
[im 75/447  vessel]
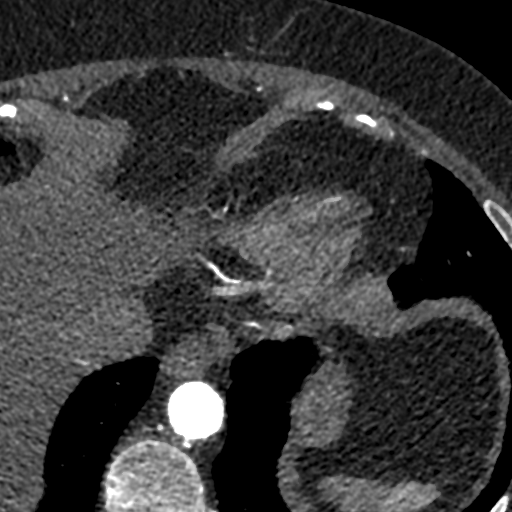
[im 149/447  vessel]
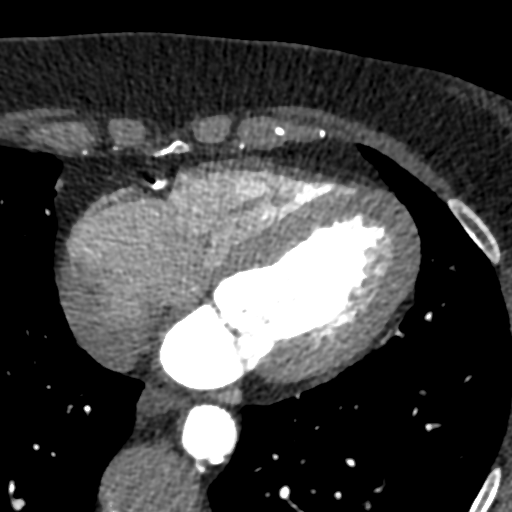
[im 224/447  vessel]
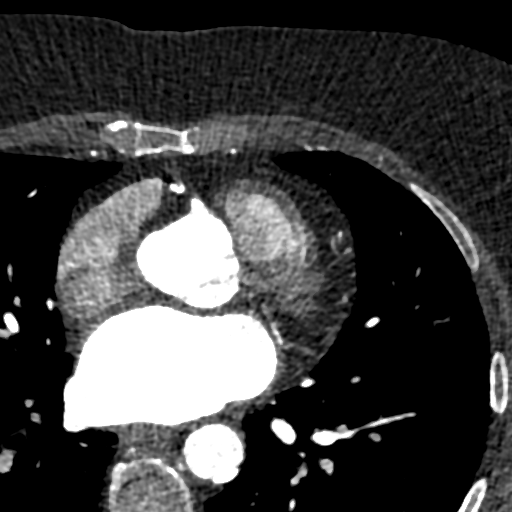
[im 298/447  vessel]
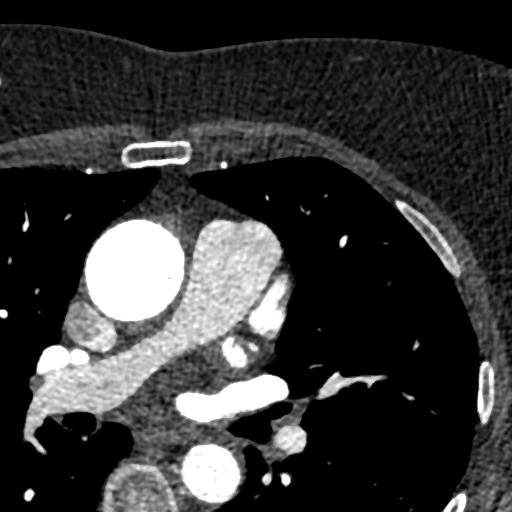
[im 372/447  vessel]
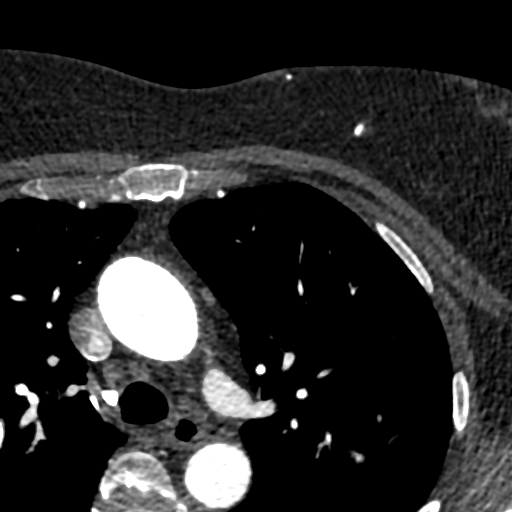

[10 of 20 positions shown; findings below may reference images not displayed]

FINDINGS: Aortic atherosclerosis. Within the visualized portions of the thorax
there are no suspicious appearing pulmonary nodules or masses, there
is no acute consolidative airspace disease, no pleural effusions, no
pneumothorax and no lymphadenopathy. Visualized portions of the
upper abdomen demonstrate diffuse low attenuation throughout the
hepatic parenchyma, indicative of hepatic steatosis. There are no
aggressive appearing lytic or blastic lesions noted in the
visualized portions of the skeleton.
IMPRESSION: 1. Aortic atherosclerosis.
2. Severe hepatic steatosis.
FINDINGS: There was mild biatrial enlargement. There was no ASD/VSD or
pericardial effusion There was no ZAJACZ thrombus. The esophagus
coursed closest to the LLPV ostium The pulmonary veins drained

Normally into the LA with no anomalies

Calcium Score:  348 dense calcium seen in proximal and mid RCA/LAD

LUPV:  Ostium 13.7 mm    area 1.8 cm2

LLPV:   Ostium 14 mm  area 1.7 cm2

RUPV:  Ostium 18.7 mm  area 2.7 cm2

RLPV:  Ostium 17.6 mm  area 3.8 cm2
IMPRESSION: 1.  No ZAJACZ thrombus

2.  Mild bi atrial enlargement

3.  Calcium score 348 which is 94th percentile for age and sex

4.  No ASD/VSD or pericardial effusion

5.  Esophagus courses closest to the RLPV ostium

6. Normal pulmonary vein anatomy see body of report for measurements

## 2018-12-02 ENCOUNTER — Ambulatory Visit (INDEPENDENT_AMBULATORY_CARE_PROVIDER_SITE_OTHER): Payer: Medicare Other

## 2018-12-02 DIAGNOSIS — R55 Syncope and collapse: Secondary | ICD-10-CM

## 2018-12-02 DIAGNOSIS — R001 Bradycardia, unspecified: Secondary | ICD-10-CM

## 2018-12-03 LAB — CUP PACEART REMOTE DEVICE CHECK
Date Time Interrogation Session: 20200127134130
MDC IDC PG IMPLANT DT: 20180702

## 2018-12-04 NOTE — Progress Notes (Signed)
Carelink Summary Report / Loop Recorder 

## 2018-12-28 ENCOUNTER — Other Ambulatory Visit: Payer: Self-pay | Admitting: Internal Medicine

## 2018-12-30 NOTE — Telephone Encounter (Signed)
Pt last saw Dr Rayann Heman 08/07/18, last labs 10/15/18 Creat 0.95 at [place], age 68, weight 75.4kg, based on specified criteria pt is on appropriate dosage of Eliquis 5mg  BID. Will refill rx.

## 2019-01-04 LAB — CUP PACEART REMOTE DEVICE CHECK
Implantable Pulse Generator Implant Date: 20180702
MDC IDC SESS DTM: 20200229134045

## 2019-01-06 ENCOUNTER — Ambulatory Visit (INDEPENDENT_AMBULATORY_CARE_PROVIDER_SITE_OTHER): Payer: Medicare Other | Admitting: *Deleted

## 2019-01-06 DIAGNOSIS — R55 Syncope and collapse: Secondary | ICD-10-CM | POA: Diagnosis not present

## 2019-01-06 DIAGNOSIS — R001 Bradycardia, unspecified: Secondary | ICD-10-CM

## 2019-01-13 NOTE — Progress Notes (Signed)
Carelink Summary Report / Loop Recorder 

## 2019-02-07 ENCOUNTER — Ambulatory Visit (INDEPENDENT_AMBULATORY_CARE_PROVIDER_SITE_OTHER): Payer: Medicare Other | Admitting: *Deleted

## 2019-02-07 ENCOUNTER — Other Ambulatory Visit: Payer: Self-pay

## 2019-02-07 DIAGNOSIS — R55 Syncope and collapse: Secondary | ICD-10-CM

## 2019-02-07 DIAGNOSIS — R001 Bradycardia, unspecified: Secondary | ICD-10-CM

## 2019-02-07 LAB — CUP PACEART REMOTE DEVICE CHECK
Date Time Interrogation Session: 20200402144006
Implantable Pulse Generator Implant Date: 20180702

## 2019-02-13 NOTE — Progress Notes (Signed)
Carelink Summary Report / Loop Recorder 

## 2019-03-19 ENCOUNTER — Ambulatory Visit (INDEPENDENT_AMBULATORY_CARE_PROVIDER_SITE_OTHER): Payer: Medicare Other | Admitting: *Deleted

## 2019-03-19 ENCOUNTER — Other Ambulatory Visit: Payer: Self-pay

## 2019-03-19 DIAGNOSIS — I480 Paroxysmal atrial fibrillation: Secondary | ICD-10-CM

## 2019-03-19 DIAGNOSIS — R55 Syncope and collapse: Secondary | ICD-10-CM | POA: Diagnosis not present

## 2019-03-19 LAB — CUP PACEART REMOTE DEVICE CHECK
Date Time Interrogation Session: 20200513074306
Implantable Pulse Generator Implant Date: 20180702

## 2019-03-24 DIAGNOSIS — R7309 Other abnormal glucose: Secondary | ICD-10-CM | POA: Diagnosis not present

## 2019-03-24 DIAGNOSIS — F411 Generalized anxiety disorder: Secondary | ICD-10-CM | POA: Diagnosis not present

## 2019-03-24 DIAGNOSIS — E782 Mixed hyperlipidemia: Secondary | ICD-10-CM | POA: Diagnosis not present

## 2019-03-31 DIAGNOSIS — J309 Allergic rhinitis, unspecified: Secondary | ICD-10-CM | POA: Diagnosis not present

## 2019-03-31 DIAGNOSIS — I4891 Unspecified atrial fibrillation: Secondary | ICD-10-CM | POA: Diagnosis not present

## 2019-03-31 DIAGNOSIS — Z1159 Encounter for screening for other viral diseases: Secondary | ICD-10-CM | POA: Diagnosis not present

## 2019-03-31 DIAGNOSIS — B182 Chronic viral hepatitis C: Secondary | ICD-10-CM | POA: Diagnosis not present

## 2019-03-31 DIAGNOSIS — G4733 Obstructive sleep apnea (adult) (pediatric): Secondary | ICD-10-CM | POA: Diagnosis not present

## 2019-04-07 NOTE — Progress Notes (Signed)
Carelink Summary Report / Loop Recorder 

## 2019-04-21 ENCOUNTER — Ambulatory Visit (INDEPENDENT_AMBULATORY_CARE_PROVIDER_SITE_OTHER): Payer: Medicare Other | Admitting: *Deleted

## 2019-04-21 ENCOUNTER — Telehealth: Payer: Self-pay

## 2019-04-21 DIAGNOSIS — R55 Syncope and collapse: Secondary | ICD-10-CM

## 2019-04-21 LAB — CUP PACEART REMOTE DEVICE CHECK
Date Time Interrogation Session: 20200615094153
Implantable Pulse Generator Implant Date: 20180702

## 2019-04-23 NOTE — Telephone Encounter (Signed)
Spoke with pt regarding appt on 04/24/19. Pt was advise to check vitals prior to appt. Pt questions and concerns were address.

## 2019-04-24 ENCOUNTER — Telehealth (INDEPENDENT_AMBULATORY_CARE_PROVIDER_SITE_OTHER): Payer: Medicare Other | Admitting: Internal Medicine

## 2019-04-24 ENCOUNTER — Encounter: Payer: Self-pay | Admitting: Internal Medicine

## 2019-04-24 VITALS — Ht 61.0 in | Wt 165.0 lb

## 2019-04-24 DIAGNOSIS — G4733 Obstructive sleep apnea (adult) (pediatric): Secondary | ICD-10-CM

## 2019-04-24 DIAGNOSIS — I480 Paroxysmal atrial fibrillation: Secondary | ICD-10-CM | POA: Diagnosis not present

## 2019-04-24 DIAGNOSIS — I10 Essential (primary) hypertension: Secondary | ICD-10-CM | POA: Diagnosis not present

## 2019-04-24 MED ORDER — APIXABAN 5 MG PO TABS: 5.0000 mg | ORAL_TABLET | Freq: Two times a day (BID) | ORAL | 11 refills | Status: DC

## 2019-04-24 MED ORDER — METOPROLOL SUCCINATE ER 25 MG PO TB24: 25.0000 mg | ORAL_TABLET | Freq: Every day | ORAL | 3 refills | Status: DC

## 2019-04-24 NOTE — Progress Notes (Signed)
Electrophysiology TeleHealth Note   Due to national recommendations of social distancing due to COVID 19, an audio/video telehealth visit is felt to be most appropriate for this patient at this time.  See MyChart message from today for the patient's consent to telehealth for [facility].   Date:  04/24/2019   ID:  Theresa Reynolds, DOB November 16, 1950, MRN [id number]  Location: patient's home  Provider location:  [facility]  Evaluation Performed: Follow-up visit  PCP:  [facility] [place]   Electrophysiologist:  Dr Rayann Heman  Chief Complaint:  palpitations  History of Present Illness:    Theresa Reynolds is a 68 y.o. female who presents via telehealth conferencing today.  Since last being seen in our clinic, the patient reports doing very well.  Today, she denies symptoms of palpitations, chest pain, shortness of breath,  lower extremity edema, dizziness, presyncope, or syncope.  The patient is otherwise without complaint today.  The patient denies symptoms of fevers, chills, cough, or new SOB worrisome for COVID 19.  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  Past Medical History:  Diagnosis Date  . Anxiety and depression    s/p suicide attempt 11/11  . Aortic insufficiency    mild by echo 10/17  . Atrial fibrillation ([place])   . Carotid stenosis, bilateral    0-39% by [place]  . Colitis   . Hepatitis C    Followed by Dr. Dallas Breeding at [facility]  s/p treatment with Bavoni.  [name] History of shingles   . HTN (hypertension)   . Hypercholesteremia   . Hypersomnia   . OSA (obstructive sleep apnea)   . Right shoulder pain   . Seasonal allergies   . Syncope and collapse   . Tobacco use   . Vitamin D deficiency     Past Surgical History:  Procedure Laterality Date  . ATRIAL FIBRILLATION ABLATION N/A 06/05/2017   Procedure: Atrial Fibrillation Ablation;  Surgeon: Thompson Grayer, MD;  Location: [place] [facility];  Service: Cardiovascular;   Laterality: N/A;  . BREAST REDUCTION SURGERY  10/1999   With Dr. Towanda Malkin  . CESAREAN SECTION    . LOOP RECORDER INSERTION N/A 05/07/2017   Procedure: Loop Recorder Insertion;  Surgeon: Thompson Grayer, MD;  Location: [place] [facility];  Service: Cardiovascular;  Laterality: N/A;    Current Outpatient Medications  Medication Sig Dispense Refill  . cetirizine (ZYRTEC) 10 MG tablet Take 10 mg by mouth daily as needed for allergies.     [name] ELIQUIS 5 MG TABS tablet TAKE 1 TABLET BY MOUTH TWICE A DAY 60 tablet 8  . fluticasone (FLONASE) 50 MCG/ACT nasal spray Place 2 sprays into both nostrils daily as needed for allergies or rhinitis.    [name] LORazepam (ATIVAN) 1 MG tablet Take 0.5-1 mg by mouth daily as needed for anxiety.     . metoprolol succinate (TOPROL-XL) 25 MG 24 hr tablet TAKE 1 TABLET BY MOUTH EVERY DAY 90 tablet 3  . Multiple Vitamins-Minerals (EMERGEN-C IMMUNE) PACK Take 1 packet by mouth daily as needed (immune support).     . sertraline (ZOLOFT) 50 MG tablet Take 50 mg by mouth daily.  0  . simvastatin (ZOCOR) 20 MG tablet Take 20 mg by mouth at bedtime.     . Tetrahydrozoline HCl (VISINE OP) Apply 1 drop to eye daily as needed (allergies).    . traZODone (DESYREL) 50 MG tablet Take 50 mg by mouth at  bedtime.      No current facility-administered medications for this visit.     Allergies:   Cymbalta [duloxetine hcl]   Social History:  The patient  reports that she has been smoking cigarettes. She has a 30.00 pack-year smoking history. She has never used smokeless tobacco. She reports current alcohol use. She reports that she does not use drugs.   Family History:  The patient's  family history includes Atrial fibrillation in her brother and mother; Heart disease in her maternal grandfather; Mitral valve prolapse in her mother.   ROS:  Please see the history of present illness.   All other systems are personally reviewed and negative.    Exam:    Vital Signs:  Ht 5\' 1"  (1.549 m)    Wt 165 lb (74.8 kg)   BMI 31.18 kg/m   Well appearing today, NAD, normal WOB   Labs/Other Tests and Data Reviewed:    Recent Labs: No results found for requested labs within last 8760 hours.   Wt Readings from Last 3 Encounters:  04/24/19 165 lb (74.8 kg)  08/07/18 166 lb 3.2 oz (75.4 kg)  12/10/17 166 lb (75.3 kg)     Last device remote is reviewed from [facility] PDF which reveals normal device function, no arrhythmias   ASSESSMENT & PLAN:    1.  Paroxysmal atrial fibrillation/ atrial flutter Doing great post ablation off AAD therapy ILR reveals no afib We discussed options of staying on anticoagulation or stopping it and following with ILR.  She is clear that she wishes to continue eliquis.  2. OSA Uses CPAP  3. Tobacco She is trying to quit  4. HTN Stable No change required today  5. Post termination pauses- resolved   Follow-up:  Return to see me in a year carelink  Patient Risk:  after full review of this patients clinical status, I feel that they are at moderate risk at this time.  Today, I have spent 15 minutes with the patient with telehealth technology discussing arrhythmia management .    SignedThompson Grayer, MD  04/24/2019 8:22 AM     [facility] [address] [place] [address] [phone] (office) [phone] (fax)

## 2019-04-29 NOTE — Progress Notes (Signed)
Carelink Summary Report / Loop Recorder 

## 2019-05-25 LAB — CUP PACEART REMOTE DEVICE CHECK
Date Time Interrogation Session: 20200718093540
Implantable Pulse Generator Implant Date: 20180702

## 2019-05-26 ENCOUNTER — Ambulatory Visit (INDEPENDENT_AMBULATORY_CARE_PROVIDER_SITE_OTHER): Payer: Medicare Other | Admitting: *Deleted

## 2019-05-26 DIAGNOSIS — R55 Syncope and collapse: Secondary | ICD-10-CM | POA: Diagnosis not present

## 2019-06-10 ENCOUNTER — Encounter: Payer: Self-pay | Admitting: Cardiology

## 2019-06-10 NOTE — Progress Notes (Signed)
Carelink Summary Report / Loop Recorder 

## 2019-06-26 ENCOUNTER — Ambulatory Visit (INDEPENDENT_AMBULATORY_CARE_PROVIDER_SITE_OTHER): Payer: Medicare Other | Admitting: *Deleted

## 2019-06-26 DIAGNOSIS — I480 Paroxysmal atrial fibrillation: Secondary | ICD-10-CM

## 2019-06-26 LAB — CUP PACEART REMOTE DEVICE CHECK
Date Time Interrogation Session: 20200820100756
Implantable Pulse Generator Implant Date: 20180702

## 2019-06-30 DIAGNOSIS — M65341 Trigger finger, right ring finger: Secondary | ICD-10-CM | POA: Insufficient documentation

## 2019-06-30 DIAGNOSIS — G5603 Carpal tunnel syndrome, bilateral upper limbs: Secondary | ICD-10-CM | POA: Diagnosis not present

## 2019-07-04 NOTE — Progress Notes (Signed)
Carelink Summary Report / Loop Recorder 

## 2019-07-29 ENCOUNTER — Ambulatory Visit (INDEPENDENT_AMBULATORY_CARE_PROVIDER_SITE_OTHER): Payer: Medicare Other | Admitting: *Deleted

## 2019-07-29 DIAGNOSIS — Z23 Encounter for immunization: Secondary | ICD-10-CM | POA: Diagnosis not present

## 2019-07-29 DIAGNOSIS — I480 Paroxysmal atrial fibrillation: Secondary | ICD-10-CM | POA: Diagnosis not present

## 2019-07-29 LAB — CUP PACEART REMOTE DEVICE CHECK
Date Time Interrogation Session: 20200922100632
Implantable Pulse Generator Implant Date: 20180702

## 2019-08-07 NOTE — Progress Notes (Signed)
Carelink Summary Report / Loop Recorder 

## 2019-08-27 DIAGNOSIS — Z1159 Encounter for screening for other viral diseases: Secondary | ICD-10-CM | POA: Diagnosis not present

## 2019-08-28 DIAGNOSIS — Z1159 Encounter for screening for other viral diseases: Secondary | ICD-10-CM | POA: Diagnosis not present

## 2019-08-31 LAB — CUP PACEART REMOTE DEVICE CHECK
Date Time Interrogation Session: 20201025095746
Implantable Pulse Generator Implant Date: 20180702

## 2019-09-01 ENCOUNTER — Ambulatory Visit (INDEPENDENT_AMBULATORY_CARE_PROVIDER_SITE_OTHER): Payer: Medicare Other | Admitting: *Deleted

## 2019-09-01 DIAGNOSIS — R55 Syncope and collapse: Secondary | ICD-10-CM

## 2019-09-01 DIAGNOSIS — I480 Paroxysmal atrial fibrillation: Secondary | ICD-10-CM | POA: Diagnosis not present

## 2019-09-02 DIAGNOSIS — F332 Major depressive disorder, recurrent severe without psychotic features: Secondary | ICD-10-CM | POA: Diagnosis not present

## 2019-09-02 DIAGNOSIS — I480 Paroxysmal atrial fibrillation: Secondary | ICD-10-CM | POA: Diagnosis not present

## 2019-09-02 DIAGNOSIS — I10 Essential (primary) hypertension: Secondary | ICD-10-CM | POA: Diagnosis not present

## 2019-09-02 DIAGNOSIS — E782 Mixed hyperlipidemia: Secondary | ICD-10-CM | POA: Diagnosis not present

## 2019-09-19 NOTE — Progress Notes (Signed)
Carelink Summary Report / Loop Recorder 

## 2019-10-06 ENCOUNTER — Ambulatory Visit (INDEPENDENT_AMBULATORY_CARE_PROVIDER_SITE_OTHER): Payer: Medicare Other | Admitting: *Deleted

## 2019-10-06 DIAGNOSIS — I480 Paroxysmal atrial fibrillation: Secondary | ICD-10-CM | POA: Diagnosis not present

## 2019-10-06 LAB — CUP PACEART REMOTE DEVICE CHECK
Date Time Interrogation Session: 20201130102116
Implantable Pulse Generator Implant Date: 20180702

## 2019-10-29 NOTE — Progress Notes (Signed)
ILR remote 

## 2019-11-06 ENCOUNTER — Ambulatory Visit (INDEPENDENT_AMBULATORY_CARE_PROVIDER_SITE_OTHER): Payer: Medicare Other | Admitting: *Deleted

## 2019-11-06 DIAGNOSIS — I480 Paroxysmal atrial fibrillation: Secondary | ICD-10-CM | POA: Diagnosis not present

## 2019-11-06 LAB — CUP PACEART REMOTE DEVICE CHECK
Date Time Interrogation Session: 20201230230652
Implantable Pulse Generator Implant Date: 20180702

## 2019-11-07 NOTE — Progress Notes (Signed)
ILR remote 

## 2019-11-18 DIAGNOSIS — E782 Mixed hyperlipidemia: Secondary | ICD-10-CM | POA: Diagnosis not present

## 2019-11-18 DIAGNOSIS — R69 Illness, unspecified: Secondary | ICD-10-CM | POA: Diagnosis not present

## 2019-11-18 DIAGNOSIS — I10 Essential (primary) hypertension: Secondary | ICD-10-CM | POA: Diagnosis not present

## 2019-11-18 DIAGNOSIS — I480 Paroxysmal atrial fibrillation: Secondary | ICD-10-CM | POA: Diagnosis not present

## 2019-12-08 ENCOUNTER — Ambulatory Visit (INDEPENDENT_AMBULATORY_CARE_PROVIDER_SITE_OTHER): Payer: No Typology Code available for payment source | Admitting: *Deleted

## 2019-12-08 DIAGNOSIS — I480 Paroxysmal atrial fibrillation: Secondary | ICD-10-CM | POA: Diagnosis not present

## 2019-12-08 LAB — CUP PACEART REMOTE DEVICE CHECK
Date Time Interrogation Session: 20210201002736
Implantable Pulse Generator Implant Date: 20180702

## 2019-12-08 NOTE — Progress Notes (Signed)
ILR Remote 

## 2019-12-19 DIAGNOSIS — I10 Essential (primary) hypertension: Secondary | ICD-10-CM | POA: Diagnosis not present

## 2019-12-19 DIAGNOSIS — E669 Obesity, unspecified: Secondary | ICD-10-CM | POA: Diagnosis not present

## 2019-12-19 DIAGNOSIS — Z0000 Encounter for general adult medical examination without abnormal findings: Secondary | ICD-10-CM | POA: Diagnosis not present

## 2019-12-19 DIAGNOSIS — E782 Mixed hyperlipidemia: Secondary | ICD-10-CM | POA: Diagnosis not present

## 2019-12-28 ENCOUNTER — Ambulatory Visit: Payer: Medicare HMO | Attending: Internal Medicine

## 2019-12-28 DIAGNOSIS — Z23 Encounter for immunization: Secondary | ICD-10-CM | POA: Insufficient documentation

## 2019-12-28 NOTE — Progress Notes (Signed)
   Covid-19 Vaccination Clinic  Name:  Theresa Reynolds    MRN: [id number] DOB: 08-24-1951  12/28/2019  [name] was observed post Covid-19 immunization for 15 minutes without incidence. She was provided with Vaccine Information Sheet and instruction to access the V-Safe system.   [name] was instructed to call 911 with any severe reactions post vaccine: [name] Difficulty breathing  . Swelling of your face and throat  . A fast heartbeat  . A bad rash all over your body  . Dizziness and weakness    Immunizations Administered    Name Date Dose VIS Date Route   Pfizer COVID-19 Vaccine 12/28/2019 10:16 AM 0.3 mL 10/17/2019 Intramuscular   Manufacturer: [place]   Lot: [id number]   [place]: [id number]

## 2020-01-08 ENCOUNTER — Ambulatory Visit (INDEPENDENT_AMBULATORY_CARE_PROVIDER_SITE_OTHER): Payer: Medicare HMO | Admitting: *Deleted

## 2020-01-08 DIAGNOSIS — I480 Paroxysmal atrial fibrillation: Secondary | ICD-10-CM

## 2020-01-08 LAB — CUP PACEART REMOTE DEVICE CHECK
Date Time Interrogation Session: 20210304025214
Implantable Pulse Generator Implant Date: 20180702

## 2020-01-08 NOTE — Progress Notes (Signed)
ILR Remote 

## 2020-01-14 DIAGNOSIS — G4733 Obstructive sleep apnea (adult) (pediatric): Secondary | ICD-10-CM | POA: Diagnosis not present

## 2020-01-21 ENCOUNTER — Ambulatory Visit: Payer: Medicare HMO | Attending: Internal Medicine

## 2020-01-21 DIAGNOSIS — Z23 Encounter for immunization: Secondary | ICD-10-CM

## 2020-01-21 NOTE — Progress Notes (Signed)
   Covid-19 Vaccination Clinic  Name:  Theresa Reynolds    MRN: [id number] DOB: [birth year]  01/21/2020  [name] was observed post Covid-19 immunization for 15 minutes without incident. She was provided with Vaccine Information Sheet and instruction to access the V-Safe system.   [name] was instructed to call 911 with any severe reactions post vaccine: [name] Difficulty breathing  . Swelling of face and throat  . A fast heartbeat  . A bad rash all over body  . Dizziness and weakness   Immunizations Administered    Name Date Dose VIS Date Route   Pfizer COVID-19 Vaccine 01/21/2020  3:55 PM 0.3 mL 10/17/2019 Intramuscular   Manufacturer: [place]   Lot: [id number]   Elias-Fela Solis: [id number]

## 2020-02-09 ENCOUNTER — Ambulatory Visit (INDEPENDENT_AMBULATORY_CARE_PROVIDER_SITE_OTHER): Payer: Medicare HMO | Admitting: *Deleted

## 2020-02-09 DIAGNOSIS — I480 Paroxysmal atrial fibrillation: Secondary | ICD-10-CM

## 2020-02-09 LAB — CUP PACEART REMOTE DEVICE CHECK
Date Time Interrogation Session: 20210404035418
Implantable Pulse Generator Implant Date: 20180702

## 2020-02-10 NOTE — Progress Notes (Signed)
ILR Remote 

## 2020-03-01 DIAGNOSIS — I10 Essential (primary) hypertension: Secondary | ICD-10-CM | POA: Diagnosis not present

## 2020-03-01 DIAGNOSIS — R69 Illness, unspecified: Secondary | ICD-10-CM | POA: Diagnosis not present

## 2020-03-01 DIAGNOSIS — I480 Paroxysmal atrial fibrillation: Secondary | ICD-10-CM | POA: Diagnosis not present

## 2020-03-01 DIAGNOSIS — E782 Mixed hyperlipidemia: Secondary | ICD-10-CM | POA: Diagnosis not present

## 2020-03-11 LAB — CUP PACEART REMOTE DEVICE CHECK
Date Time Interrogation Session: 20210505035817
Implantable Pulse Generator Implant Date: 20180702

## 2020-03-15 ENCOUNTER — Ambulatory Visit (INDEPENDENT_AMBULATORY_CARE_PROVIDER_SITE_OTHER): Payer: Medicare HMO | Admitting: *Deleted

## 2020-03-15 DIAGNOSIS — R55 Syncope and collapse: Secondary | ICD-10-CM

## 2020-03-16 NOTE — Progress Notes (Signed)
Carelink Summary Report / Loop Recorder 

## 2020-03-19 DIAGNOSIS — R945 Abnormal results of liver function studies: Secondary | ICD-10-CM | POA: Diagnosis not present

## 2020-03-19 DIAGNOSIS — Z79899 Other long term (current) drug therapy: Secondary | ICD-10-CM | POA: Diagnosis not present

## 2020-03-19 DIAGNOSIS — Z1159 Encounter for screening for other viral diseases: Secondary | ICD-10-CM | POA: Diagnosis not present

## 2020-03-19 DIAGNOSIS — R69 Illness, unspecified: Secondary | ICD-10-CM | POA: Diagnosis not present

## 2020-04-05 DIAGNOSIS — G4733 Obstructive sleep apnea (adult) (pediatric): Secondary | ICD-10-CM | POA: Diagnosis not present

## 2020-04-05 DIAGNOSIS — I272 Pulmonary hypertension, unspecified: Secondary | ICD-10-CM | POA: Diagnosis not present

## 2020-04-05 DIAGNOSIS — I4891 Unspecified atrial fibrillation: Secondary | ICD-10-CM | POA: Diagnosis not present

## 2020-04-09 ENCOUNTER — Telehealth: Payer: Self-pay

## 2020-04-09 NOTE — Telephone Encounter (Signed)
Left a message regarding virtual appt on 04/12/20.

## 2020-04-12 ENCOUNTER — Other Ambulatory Visit: Payer: Self-pay

## 2020-04-12 ENCOUNTER — Encounter: Payer: Self-pay | Admitting: Internal Medicine

## 2020-04-12 ENCOUNTER — Telehealth (INDEPENDENT_AMBULATORY_CARE_PROVIDER_SITE_OTHER): Payer: Medicare HMO | Admitting: Internal Medicine

## 2020-04-12 VITALS — BP 126/68 | Ht 61.0 in | Wt 165.0 lb

## 2020-04-12 DIAGNOSIS — I10 Essential (primary) hypertension: Secondary | ICD-10-CM | POA: Diagnosis not present

## 2020-04-12 DIAGNOSIS — D6869 Other thrombophilia: Secondary | ICD-10-CM | POA: Diagnosis not present

## 2020-04-12 DIAGNOSIS — G4733 Obstructive sleep apnea (adult) (pediatric): Secondary | ICD-10-CM | POA: Diagnosis not present

## 2020-04-12 DIAGNOSIS — I480 Paroxysmal atrial fibrillation: Secondary | ICD-10-CM | POA: Diagnosis not present

## 2020-04-12 NOTE — Progress Notes (Signed)
Electrophysiology TeleHealth Note   Due to national recommendations of social distancing due to COVID 19, an audio/video telehealth visit is felt to be most appropriate for this patient at this time.  See MyChart message from today for the patient's consent to telehealth for [facility].  Date:  04/12/2020   ID:  Theresa Reynolds, DOB 12-11-1950, MRN [id number]  Location: patient's home  Provider location:  [place]  Evaluation Performed: Follow-up visit  PCP:  [facility] [place]   Electrophysiologist:  Dr Rayann Heman  Chief Complaint:  palpitations  History of Present Illness:    Theresa Reynolds is a 69 y.o. female who presents via telehealth conferencing today.  Since last being seen in our clinic, the patient reports doing very well. No afib in the past year.  Today, she denies symptoms of palpitations, chest pain, shortness of breath,  lower extremity edema, dizziness, presyncope, or syncope.  The patient is otherwise without complaint today.   Past Medical History:  Diagnosis Date  . Anxiety and depression    s/p suicide attempt 11/11  . Aortic insufficiency    mild by echo 10/17  . Atrial fibrillation ([place])   . Carotid stenosis, bilateral    0-39% by [place]  . Colitis   . Hepatitis C    Followed by Dr. Dallas Breeding at [facility].  s/p treatment with Bavoni.  [name] History of shingles   . HTN (hypertension)   . Hypercholesteremia   . Hypersomnia   . OSA (obstructive sleep apnea)   . Right shoulder pain   . Seasonal allergies   . Syncope and collapse   . Tobacco use   . Vitamin D deficiency     Past Surgical History:  Procedure Laterality Date  . ATRIAL FIBRILLATION ABLATION N/A 06/05/2017   Procedure: Atrial Fibrillation Ablation;  Surgeon: Thompson Grayer, MD;  Location: [place] [facility];  Service: Cardiovascular;  Laterality: N/A;  . BREAST REDUCTION SURGERY  10/1999   With Dr. Towanda Malkin  . CESAREAN SECTION    . LOOP RECORDER  INSERTION N/A 05/07/2017   Procedure: Loop Recorder Insertion;  Surgeon: Thompson Grayer, MD;  Location: [place] [facility];  Service: Cardiovascular;  Laterality: N/A;    Current Outpatient Medications  Medication Sig Dispense Refill  . apixaban (ELIQUIS) 5 MG TABS tablet Take 1 tablet (5 mg total) by mouth 2 (two) times daily. 60 tablet 11  . cetirizine (ZYRTEC) 10 MG tablet Take 10 mg by mouth daily as needed for allergies.     . fluticasone (FLONASE) 50 MCG/ACT nasal spray Place 2 sprays into both nostrils daily as needed for allergies or rhinitis.    [name] LORazepam (ATIVAN) 1 MG tablet Take 0.5-1 mg by mouth daily as needed for anxiety.     . metoprolol succinate (TOPROL-XL) 25 MG 24 hr tablet Take 1 tablet (25 mg total) by mouth daily. 90 tablet 3  . Multiple Vitamins-Minerals (EMERGEN-C IMMUNE) PACK Take 1 packet by mouth daily as needed (immune support).     . sertraline (ZOLOFT) 50 MG tablet Take 50 mg by mouth daily.  0  . simvastatin (ZOCOR) 20 MG tablet Take 20 mg by mouth at bedtime.     . Tetrahydrozoline HCl (VISINE OP) Apply 1 drop to eye daily as needed (allergies).    . traZODone (DESYREL) 50 MG tablet Take 50 mg by mouth at bedtime.      No current facility-administered medications for this visit.  Allergies:   Cymbalta [duloxetine hcl]   Social History:  The patient  reports that she has been smoking cigarettes. She has a 30.00 pack-year smoking history. She has never used smokeless tobacco. She reports current alcohol use. She reports that she does not use drugs.   ROS:  Please see the history of present illness.   All other systems are personally reviewed and negative.    Exam:    Vital Signs:  BP 126/68   Ht 5\' 1"  (1.549 m)   Wt 165 lb (74.8 kg)   BMI 31.18 kg/m   Well sounding and appearing, alert and conversant, regular work of breathing,  good skin color Eyes- anicteric, neuro- grossly intact, skin- no apparent rash or lesions or cyanosis, mouth- oral mucosa  is pink  Labs/Other Tests and Data Reviewed:    Recent Labs: No results found for requested labs within last 8760 hours.   Wt Readings from Last 3 Encounters:  04/12/20 165 lb (74.8 kg)  04/24/19 165 lb (74.8 kg)  08/07/18 166 lb 3.2 oz (75.4 kg)     Last device remote is reviewed from [place] PDF which reveals normal device function, no arrhythmias   ASSESSMENT & PLAN:    1.  Paroxysmal atrial fibrillation/ atrial flutter No afib by ILR in the past year post ablation off AAD therapy chads2vasc score is 3.  We have discussed at length.  She prefers to stay on [facility] therapy. Her ILR is approaching EOL.  We discussed options for device removal once it is no longer working (will likely be in the next 6 months). She is clear that she would like to have an new ILR placed at time of removal of old device.  I think that this would be very beneficial to further monitor for pauses/ syncope and for afib management post ablation.  2. htn Stable No change required today  3. HL Continue statin  4. OSA Uses CPAP   Risks, benefits and potential toxicities for medications prescribed and/or refilled reviewed with patient today.   Follow-up:  I will see in a year unless her ILR reaches ERI in the interim. We discussed risks of ILR removal and new device placement at length today.  She understands that risks include but are not limited to infection and bleeding.  Follow-up with Dr Claudie Leach   Patient Risk:  after full review of this patients clinical status, I feel that they are at moderate risk at this time.  Today, I have spent 15 minutes with the patient with telehealth technology discussing arrhythmia management .    Army Fossa, MD  04/12/2020 9:45 AM     [facility] [address] [place] [address] [phone] (office) [phone] (fax)

## 2020-04-16 DIAGNOSIS — Z86010 Personal history of colonic polyps: Secondary | ICD-10-CM | POA: Diagnosis not present

## 2020-04-19 ENCOUNTER — Ambulatory Visit (INDEPENDENT_AMBULATORY_CARE_PROVIDER_SITE_OTHER): Payer: Medicare HMO | Admitting: *Deleted

## 2020-04-19 DIAGNOSIS — R55 Syncope and collapse: Secondary | ICD-10-CM

## 2020-04-19 DIAGNOSIS — Z86010 Personal history of colonic polyps: Secondary | ICD-10-CM | POA: Diagnosis not present

## 2020-04-19 DIAGNOSIS — Z01818 Encounter for other preprocedural examination: Secondary | ICD-10-CM | POA: Diagnosis not present

## 2020-04-19 DIAGNOSIS — Z1211 Encounter for screening for malignant neoplasm of colon: Secondary | ICD-10-CM | POA: Diagnosis not present

## 2020-04-19 DIAGNOSIS — K635 Polyp of colon: Secondary | ICD-10-CM | POA: Diagnosis not present

## 2020-04-19 LAB — CUP PACEART REMOTE DEVICE CHECK
Date Time Interrogation Session: 20210613232842
Implantable Pulse Generator Implant Date: 20180702

## 2020-04-21 NOTE — Progress Notes (Signed)
Carelink Summary Report / Loop Recorder 

## 2020-04-23 DIAGNOSIS — K635 Polyp of colon: Secondary | ICD-10-CM | POA: Diagnosis not present

## 2020-05-19 ENCOUNTER — Other Ambulatory Visit: Payer: Self-pay | Admitting: Internal Medicine

## 2020-05-19 NOTE — Telephone Encounter (Signed)
Eliquis 5mg  refill request received. Patient is 69 years old, weight-74.8kg, Crea-0.92 on 12/19/2019 via [facility] PCP, Diagnosis-Afib, and last seen by Dr. Rayann Heman on 04/12/2020. Dose is appropriate based on dosing criteria. Will send in refill to requested pharmacy.

## 2020-05-22 DIAGNOSIS — Z8371 Family history of colonic polyps: Secondary | ICD-10-CM | POA: Diagnosis not present

## 2020-05-22 DIAGNOSIS — K5730 Diverticulosis of large intestine without perforation or abscess without bleeding: Secondary | ICD-10-CM | POA: Diagnosis not present

## 2020-05-22 DIAGNOSIS — K648 Other hemorrhoids: Secondary | ICD-10-CM | POA: Diagnosis not present

## 2020-05-22 DIAGNOSIS — D126 Benign neoplasm of colon, unspecified: Secondary | ICD-10-CM | POA: Diagnosis not present

## 2020-05-24 ENCOUNTER — Ambulatory Visit (INDEPENDENT_AMBULATORY_CARE_PROVIDER_SITE_OTHER): Payer: Medicare HMO | Admitting: *Deleted

## 2020-05-24 DIAGNOSIS — R55 Syncope and collapse: Secondary | ICD-10-CM

## 2020-05-24 LAB — CUP PACEART REMOTE DEVICE CHECK
Date Time Interrogation Session: 20210718230620
Implantable Pulse Generator Implant Date: 20180702

## 2020-05-26 NOTE — Progress Notes (Signed)
Carelink Summary Report / Loop Recorder 

## 2020-05-28 ENCOUNTER — Other Ambulatory Visit: Payer: Self-pay | Admitting: Internal Medicine

## 2020-06-01 DIAGNOSIS — G4733 Obstructive sleep apnea (adult) (pediatric): Secondary | ICD-10-CM | POA: Diagnosis not present

## 2020-06-15 DIAGNOSIS — G43109 Migraine with aura, not intractable, without status migrainosus: Secondary | ICD-10-CM | POA: Diagnosis not present

## 2020-06-15 DIAGNOSIS — H5712 Ocular pain, left eye: Secondary | ICD-10-CM | POA: Diagnosis not present

## 2020-06-15 DIAGNOSIS — H6122 Impacted cerumen, left ear: Secondary | ICD-10-CM | POA: Diagnosis not present

## 2020-06-15 DIAGNOSIS — M542 Cervicalgia: Secondary | ICD-10-CM | POA: Diagnosis not present

## 2020-06-15 DIAGNOSIS — M5412 Radiculopathy, cervical region: Secondary | ICD-10-CM | POA: Diagnosis not present

## 2020-06-18 DIAGNOSIS — H9192 Unspecified hearing loss, left ear: Secondary | ICD-10-CM | POA: Diagnosis not present

## 2020-06-18 DIAGNOSIS — H6090 Unspecified otitis externa, unspecified ear: Secondary | ICD-10-CM | POA: Diagnosis not present

## 2020-06-18 DIAGNOSIS — M542 Cervicalgia: Secondary | ICD-10-CM | POA: Diagnosis not present

## 2020-06-27 LAB — CUP PACEART REMOTE DEVICE CHECK
Date Time Interrogation Session: 20210818232729
Implantable Pulse Generator Implant Date: 20180702

## 2020-06-28 ENCOUNTER — Ambulatory Visit (INDEPENDENT_AMBULATORY_CARE_PROVIDER_SITE_OTHER): Payer: Medicare HMO | Admitting: *Deleted

## 2020-06-28 DIAGNOSIS — R55 Syncope and collapse: Secondary | ICD-10-CM

## 2020-07-02 NOTE — Progress Notes (Signed)
Carelink Summary Report / Loop Recorder 

## 2020-08-02 ENCOUNTER — Ambulatory Visit (INDEPENDENT_AMBULATORY_CARE_PROVIDER_SITE_OTHER): Payer: Medicare HMO | Admitting: Emergency Medicine

## 2020-08-02 DIAGNOSIS — R55 Syncope and collapse: Secondary | ICD-10-CM

## 2020-08-02 LAB — CUP PACEART REMOTE DEVICE CHECK
Date Time Interrogation Session: 20210918232230
Implantable Pulse Generator Implant Date: 20180702

## 2020-08-04 NOTE — Progress Notes (Signed)
Carelink Summary Report / Loop Recorder 

## 2020-08-27 DIAGNOSIS — I10 Essential (primary) hypertension: Secondary | ICD-10-CM | POA: Diagnosis not present

## 2020-08-27 DIAGNOSIS — E782 Mixed hyperlipidemia: Secondary | ICD-10-CM | POA: Diagnosis not present

## 2020-08-27 DIAGNOSIS — Z23 Encounter for immunization: Secondary | ICD-10-CM | POA: Diagnosis not present

## 2020-08-27 DIAGNOSIS — R7303 Prediabetes: Secondary | ICD-10-CM | POA: Diagnosis not present

## 2020-08-27 DIAGNOSIS — R69 Illness, unspecified: Secondary | ICD-10-CM | POA: Diagnosis not present

## 2020-08-31 DIAGNOSIS — I480 Paroxysmal atrial fibrillation: Secondary | ICD-10-CM | POA: Diagnosis not present

## 2020-08-31 DIAGNOSIS — I10 Essential (primary) hypertension: Secondary | ICD-10-CM | POA: Diagnosis not present

## 2020-08-31 DIAGNOSIS — R69 Illness, unspecified: Secondary | ICD-10-CM | POA: Diagnosis not present

## 2020-08-31 DIAGNOSIS — E782 Mixed hyperlipidemia: Secondary | ICD-10-CM | POA: Diagnosis not present

## 2020-09-02 DIAGNOSIS — G4733 Obstructive sleep apnea (adult) (pediatric): Secondary | ICD-10-CM | POA: Diagnosis not present

## 2020-09-04 ENCOUNTER — Ambulatory Visit: Payer: Medicare HMO | Attending: Internal Medicine

## 2020-09-04 DIAGNOSIS — Z23 Encounter for immunization: Secondary | ICD-10-CM

## 2020-09-04 NOTE — Progress Notes (Signed)
.    Covid-19 Vaccination Clinic  Name:  Theresa Reynolds    MRN: [id number] DOB: [birth year]  09/04/2020  [name] was observed post Covid-19 immunization for 15 minutes without incident. She was provided with Vaccine Information Sheet and instruction to access the V-Safe system.   [name] was instructed to call 911 with any severe reactions post vaccine: [name] Difficulty breathing  . Swelling of face and throat  . A fast heartbeat  . A bad rash all over body  . Dizziness and weakness

## 2020-09-14 DIAGNOSIS — R69 Illness, unspecified: Secondary | ICD-10-CM | POA: Diagnosis not present

## 2020-09-14 DIAGNOSIS — G43109 Migraine with aura, not intractable, without status migrainosus: Secondary | ICD-10-CM | POA: Diagnosis not present

## 2020-09-14 DIAGNOSIS — E782 Mixed hyperlipidemia: Secondary | ICD-10-CM | POA: Diagnosis not present

## 2020-09-14 DIAGNOSIS — I480 Paroxysmal atrial fibrillation: Secondary | ICD-10-CM | POA: Diagnosis not present

## 2020-09-14 DIAGNOSIS — I10 Essential (primary) hypertension: Secondary | ICD-10-CM | POA: Diagnosis not present

## 2020-09-16 DIAGNOSIS — F3341 Major depressive disorder, recurrent, in partial remission: Secondary | ICD-10-CM | POA: Diagnosis not present

## 2020-09-16 DIAGNOSIS — R69 Illness, unspecified: Secondary | ICD-10-CM | POA: Diagnosis not present

## 2020-09-17 DIAGNOSIS — Z1283 Encounter for screening for malignant neoplasm of skin: Secondary | ICD-10-CM | POA: Diagnosis not present

## 2020-09-17 DIAGNOSIS — L299 Pruritus, unspecified: Secondary | ICD-10-CM | POA: Diagnosis not present

## 2020-09-17 DIAGNOSIS — L814 Other melanin hyperpigmentation: Secondary | ICD-10-CM | POA: Diagnosis not present

## 2020-09-17 DIAGNOSIS — D2372 Other benign neoplasm of skin of left lower limb, including hip: Secondary | ICD-10-CM | POA: Diagnosis not present

## 2020-09-18 DIAGNOSIS — K648 Other hemorrhoids: Secondary | ICD-10-CM | POA: Diagnosis not present

## 2020-09-18 DIAGNOSIS — Z9229 Personal history of other drug therapy: Secondary | ICD-10-CM | POA: Diagnosis not present

## 2020-09-18 DIAGNOSIS — Z01818 Encounter for other preprocedural examination: Secondary | ICD-10-CM | POA: Diagnosis not present

## 2020-09-20 DIAGNOSIS — I351 Nonrheumatic aortic (valve) insufficiency: Secondary | ICD-10-CM | POA: Diagnosis not present

## 2020-09-20 DIAGNOSIS — G5603 Carpal tunnel syndrome, bilateral upper limbs: Secondary | ICD-10-CM | POA: Diagnosis not present

## 2020-09-20 DIAGNOSIS — I272 Pulmonary hypertension, unspecified: Secondary | ICD-10-CM | POA: Diagnosis not present

## 2020-09-20 DIAGNOSIS — I4891 Unspecified atrial fibrillation: Secondary | ICD-10-CM | POA: Diagnosis not present

## 2020-09-23 DIAGNOSIS — K648 Other hemorrhoids: Secondary | ICD-10-CM | POA: Diagnosis not present

## 2020-09-23 DIAGNOSIS — Z01818 Encounter for other preprocedural examination: Secondary | ICD-10-CM | POA: Diagnosis not present

## 2020-09-23 DIAGNOSIS — Z1211 Encounter for screening for malignant neoplasm of colon: Secondary | ICD-10-CM | POA: Diagnosis not present

## 2020-09-27 ENCOUNTER — Ambulatory Visit (INDEPENDENT_AMBULATORY_CARE_PROVIDER_SITE_OTHER): Payer: Medicare HMO

## 2020-09-27 DIAGNOSIS — R55 Syncope and collapse: Secondary | ICD-10-CM | POA: Diagnosis not present

## 2020-09-27 LAB — CUP PACEART REMOTE DEVICE CHECK
Date Time Interrogation Session: 20211119223558
Implantable Pulse Generator Implant Date: 20180702

## 2020-09-29 NOTE — Progress Notes (Signed)
Carelink Summary Report / Loop Recorder 

## 2020-10-18 DIAGNOSIS — K591 Functional diarrhea: Secondary | ICD-10-CM | POA: Diagnosis not present

## 2020-10-18 DIAGNOSIS — Z01818 Encounter for other preprocedural examination: Secondary | ICD-10-CM | POA: Diagnosis not present

## 2020-10-18 DIAGNOSIS — K648 Other hemorrhoids: Secondary | ICD-10-CM | POA: Diagnosis not present

## 2020-10-18 DIAGNOSIS — Z9229 Personal history of other drug therapy: Secondary | ICD-10-CM | POA: Diagnosis not present

## 2020-10-26 ENCOUNTER — Ambulatory Visit (INDEPENDENT_AMBULATORY_CARE_PROVIDER_SITE_OTHER): Payer: Medicare HMO

## 2020-10-26 DIAGNOSIS — R55 Syncope and collapse: Secondary | ICD-10-CM

## 2020-10-26 LAB — CUP PACEART REMOTE DEVICE CHECK
Date Time Interrogation Session: 20211220223355
Implantable Pulse Generator Implant Date: 20180702

## 2020-11-04 DIAGNOSIS — G43109 Migraine with aura, not intractable, without status migrainosus: Secondary | ICD-10-CM | POA: Diagnosis not present

## 2020-11-04 DIAGNOSIS — E782 Mixed hyperlipidemia: Secondary | ICD-10-CM | POA: Diagnosis not present

## 2020-11-04 DIAGNOSIS — I10 Essential (primary) hypertension: Secondary | ICD-10-CM | POA: Diagnosis not present

## 2020-11-04 DIAGNOSIS — R69 Illness, unspecified: Secondary | ICD-10-CM | POA: Diagnosis not present

## 2020-11-04 DIAGNOSIS — I480 Paroxysmal atrial fibrillation: Secondary | ICD-10-CM | POA: Diagnosis not present

## 2020-11-09 NOTE — Progress Notes (Signed)
Carelink Summary Report / Loop Recorder 

## 2020-11-11 DIAGNOSIS — E782 Mixed hyperlipidemia: Secondary | ICD-10-CM | POA: Diagnosis not present

## 2020-11-11 DIAGNOSIS — G43109 Migraine with aura, not intractable, without status migrainosus: Secondary | ICD-10-CM | POA: Diagnosis not present

## 2020-11-11 DIAGNOSIS — R69 Illness, unspecified: Secondary | ICD-10-CM | POA: Diagnosis not present

## 2020-11-11 DIAGNOSIS — I480 Paroxysmal atrial fibrillation: Secondary | ICD-10-CM | POA: Diagnosis not present

## 2020-11-11 DIAGNOSIS — I10 Essential (primary) hypertension: Secondary | ICD-10-CM | POA: Diagnosis not present

## 2020-11-26 ENCOUNTER — Ambulatory Visit (INDEPENDENT_AMBULATORY_CARE_PROVIDER_SITE_OTHER): Payer: Medicare HMO

## 2020-11-26 DIAGNOSIS — R55 Syncope and collapse: Secondary | ICD-10-CM

## 2020-11-26 DIAGNOSIS — K648 Other hemorrhoids: Secondary | ICD-10-CM | POA: Diagnosis not present

## 2020-11-26 DIAGNOSIS — Z01818 Encounter for other preprocedural examination: Secondary | ICD-10-CM | POA: Diagnosis not present

## 2020-11-26 DIAGNOSIS — Z1211 Encounter for screening for malignant neoplasm of colon: Secondary | ICD-10-CM | POA: Diagnosis not present

## 2020-11-28 LAB — CUP PACEART REMOTE DEVICE CHECK
Date Time Interrogation Session: 20220122223312
Implantable Pulse Generator Implant Date: 20180702

## 2020-11-29 DIAGNOSIS — E559 Vitamin D deficiency, unspecified: Secondary | ICD-10-CM | POA: Diagnosis not present

## 2020-11-29 DIAGNOSIS — E782 Mixed hyperlipidemia: Secondary | ICD-10-CM | POA: Diagnosis not present

## 2020-11-29 DIAGNOSIS — R718 Other abnormality of red blood cells: Secondary | ICD-10-CM | POA: Diagnosis not present

## 2020-11-29 DIAGNOSIS — R7303 Prediabetes: Secondary | ICD-10-CM | POA: Diagnosis not present

## 2020-11-29 DIAGNOSIS — I10 Essential (primary) hypertension: Secondary | ICD-10-CM | POA: Diagnosis not present

## 2020-11-30 DIAGNOSIS — K529 Noninfective gastroenteritis and colitis, unspecified: Secondary | ICD-10-CM | POA: Diagnosis not present

## 2020-12-03 DIAGNOSIS — Z0000 Encounter for general adult medical examination without abnormal findings: Secondary | ICD-10-CM | POA: Diagnosis not present

## 2020-12-03 DIAGNOSIS — I10 Essential (primary) hypertension: Secondary | ICD-10-CM | POA: Diagnosis not present

## 2020-12-03 DIAGNOSIS — G4733 Obstructive sleep apnea (adult) (pediatric): Secondary | ICD-10-CM | POA: Diagnosis not present

## 2020-12-03 DIAGNOSIS — R69 Illness, unspecified: Secondary | ICD-10-CM | POA: Diagnosis not present

## 2020-12-03 DIAGNOSIS — E559 Vitamin D deficiency, unspecified: Secondary | ICD-10-CM | POA: Diagnosis not present

## 2020-12-08 NOTE — Progress Notes (Signed)
Carelink Summary Report / Loop Recorder 

## 2020-12-20 DIAGNOSIS — M5412 Radiculopathy, cervical region: Secondary | ICD-10-CM | POA: Diagnosis not present

## 2020-12-20 DIAGNOSIS — M4722 Other spondylosis with radiculopathy, cervical region: Secondary | ICD-10-CM | POA: Diagnosis not present

## 2020-12-27 ENCOUNTER — Ambulatory Visit (INDEPENDENT_AMBULATORY_CARE_PROVIDER_SITE_OTHER): Payer: Medicare HMO

## 2020-12-27 DIAGNOSIS — I480 Paroxysmal atrial fibrillation: Secondary | ICD-10-CM | POA: Diagnosis not present

## 2020-12-29 DIAGNOSIS — R194 Change in bowel habit: Secondary | ICD-10-CM | POA: Diagnosis not present

## 2020-12-29 DIAGNOSIS — Z01818 Encounter for other preprocedural examination: Secondary | ICD-10-CM | POA: Diagnosis not present

## 2020-12-29 DIAGNOSIS — K648 Other hemorrhoids: Secondary | ICD-10-CM | POA: Diagnosis not present

## 2020-12-31 LAB — CUP PACEART REMOTE DEVICE CHECK
Date Time Interrogation Session: 20220224223801
Implantable Pulse Generator Implant Date: 20180702

## 2021-01-03 DIAGNOSIS — M542 Cervicalgia: Secondary | ICD-10-CM | POA: Diagnosis not present

## 2021-01-03 DIAGNOSIS — M5412 Radiculopathy, cervical region: Secondary | ICD-10-CM | POA: Diagnosis not present

## 2021-01-03 DIAGNOSIS — M4722 Other spondylosis with radiculopathy, cervical region: Secondary | ICD-10-CM | POA: Diagnosis not present

## 2021-01-04 NOTE — Progress Notes (Signed)
Carelink Summary Report / Loop Recorder 

## 2021-01-06 DIAGNOSIS — M6281 Muscle weakness (generalized): Secondary | ICD-10-CM | POA: Diagnosis not present

## 2021-01-06 DIAGNOSIS — M5412 Radiculopathy, cervical region: Secondary | ICD-10-CM | POA: Diagnosis not present

## 2021-01-06 DIAGNOSIS — M2560 Stiffness of unspecified joint, not elsewhere classified: Secondary | ICD-10-CM | POA: Diagnosis not present

## 2021-01-06 DIAGNOSIS — M542 Cervicalgia: Secondary | ICD-10-CM | POA: Diagnosis not present

## 2021-01-06 DIAGNOSIS — R293 Abnormal posture: Secondary | ICD-10-CM | POA: Diagnosis not present

## 2021-01-10 DIAGNOSIS — M2560 Stiffness of unspecified joint, not elsewhere classified: Secondary | ICD-10-CM | POA: Diagnosis not present

## 2021-01-10 DIAGNOSIS — M6281 Muscle weakness (generalized): Secondary | ICD-10-CM | POA: Diagnosis not present

## 2021-01-10 DIAGNOSIS — M542 Cervicalgia: Secondary | ICD-10-CM | POA: Diagnosis not present

## 2021-01-10 DIAGNOSIS — R293 Abnormal posture: Secondary | ICD-10-CM | POA: Diagnosis not present

## 2021-01-10 DIAGNOSIS — M5412 Radiculopathy, cervical region: Secondary | ICD-10-CM | POA: Diagnosis not present

## 2021-01-12 DIAGNOSIS — R293 Abnormal posture: Secondary | ICD-10-CM | POA: Diagnosis not present

## 2021-01-12 DIAGNOSIS — M542 Cervicalgia: Secondary | ICD-10-CM | POA: Diagnosis not present

## 2021-01-12 DIAGNOSIS — M5412 Radiculopathy, cervical region: Secondary | ICD-10-CM | POA: Diagnosis not present

## 2021-01-12 DIAGNOSIS — M6281 Muscle weakness (generalized): Secondary | ICD-10-CM | POA: Diagnosis not present

## 2021-01-12 DIAGNOSIS — M2560 Stiffness of unspecified joint, not elsewhere classified: Secondary | ICD-10-CM | POA: Diagnosis not present

## 2021-01-13 DIAGNOSIS — R293 Abnormal posture: Secondary | ICD-10-CM | POA: Diagnosis not present

## 2021-01-13 DIAGNOSIS — M2560 Stiffness of unspecified joint, not elsewhere classified: Secondary | ICD-10-CM | POA: Diagnosis not present

## 2021-01-13 DIAGNOSIS — M6281 Muscle weakness (generalized): Secondary | ICD-10-CM | POA: Diagnosis not present

## 2021-01-13 DIAGNOSIS — M5412 Radiculopathy, cervical region: Secondary | ICD-10-CM | POA: Diagnosis not present

## 2021-01-13 DIAGNOSIS — M542 Cervicalgia: Secondary | ICD-10-CM | POA: Diagnosis not present

## 2021-01-17 DIAGNOSIS — R293 Abnormal posture: Secondary | ICD-10-CM | POA: Diagnosis not present

## 2021-01-17 DIAGNOSIS — M5412 Radiculopathy, cervical region: Secondary | ICD-10-CM | POA: Diagnosis not present

## 2021-01-17 DIAGNOSIS — M6281 Muscle weakness (generalized): Secondary | ICD-10-CM | POA: Diagnosis not present

## 2021-01-17 DIAGNOSIS — M542 Cervicalgia: Secondary | ICD-10-CM | POA: Diagnosis not present

## 2021-01-17 DIAGNOSIS — M2560 Stiffness of unspecified joint, not elsewhere classified: Secondary | ICD-10-CM | POA: Diagnosis not present

## 2021-01-19 DIAGNOSIS — M6281 Muscle weakness (generalized): Secondary | ICD-10-CM | POA: Diagnosis not present

## 2021-01-19 DIAGNOSIS — R293 Abnormal posture: Secondary | ICD-10-CM | POA: Diagnosis not present

## 2021-01-19 DIAGNOSIS — M542 Cervicalgia: Secondary | ICD-10-CM | POA: Diagnosis not present

## 2021-01-19 DIAGNOSIS — M2560 Stiffness of unspecified joint, not elsewhere classified: Secondary | ICD-10-CM | POA: Diagnosis not present

## 2021-01-19 DIAGNOSIS — M5412 Radiculopathy, cervical region: Secondary | ICD-10-CM | POA: Diagnosis not present

## 2021-01-21 DIAGNOSIS — R293 Abnormal posture: Secondary | ICD-10-CM | POA: Diagnosis not present

## 2021-01-21 DIAGNOSIS — M542 Cervicalgia: Secondary | ICD-10-CM | POA: Diagnosis not present

## 2021-01-21 DIAGNOSIS — M6281 Muscle weakness (generalized): Secondary | ICD-10-CM | POA: Diagnosis not present

## 2021-01-21 DIAGNOSIS — M2560 Stiffness of unspecified joint, not elsewhere classified: Secondary | ICD-10-CM | POA: Diagnosis not present

## 2021-01-21 DIAGNOSIS — M5412 Radiculopathy, cervical region: Secondary | ICD-10-CM | POA: Diagnosis not present

## 2021-01-24 DIAGNOSIS — M6281 Muscle weakness (generalized): Secondary | ICD-10-CM | POA: Diagnosis not present

## 2021-01-24 DIAGNOSIS — M5412 Radiculopathy, cervical region: Secondary | ICD-10-CM | POA: Diagnosis not present

## 2021-01-24 DIAGNOSIS — M2560 Stiffness of unspecified joint, not elsewhere classified: Secondary | ICD-10-CM | POA: Diagnosis not present

## 2021-01-24 DIAGNOSIS — R293 Abnormal posture: Secondary | ICD-10-CM | POA: Diagnosis not present

## 2021-01-24 DIAGNOSIS — M542 Cervicalgia: Secondary | ICD-10-CM | POA: Diagnosis not present

## 2021-02-02 DIAGNOSIS — I480 Paroxysmal atrial fibrillation: Secondary | ICD-10-CM | POA: Diagnosis not present

## 2021-02-02 DIAGNOSIS — I10 Essential (primary) hypertension: Secondary | ICD-10-CM | POA: Diagnosis not present

## 2021-02-02 DIAGNOSIS — R69 Illness, unspecified: Secondary | ICD-10-CM | POA: Diagnosis not present

## 2021-02-02 DIAGNOSIS — E782 Mixed hyperlipidemia: Secondary | ICD-10-CM | POA: Diagnosis not present

## 2021-02-02 DIAGNOSIS — M4722 Other spondylosis with radiculopathy, cervical region: Secondary | ICD-10-CM | POA: Diagnosis not present

## 2021-02-02 DIAGNOSIS — G43109 Migraine with aura, not intractable, without status migrainosus: Secondary | ICD-10-CM | POA: Diagnosis not present

## 2021-03-04 DIAGNOSIS — G4733 Obstructive sleep apnea (adult) (pediatric): Secondary | ICD-10-CM | POA: Diagnosis not present

## 2021-03-09 ENCOUNTER — Telehealth: Payer: Self-pay

## 2021-03-09 NOTE — Telephone Encounter (Signed)
The patient called to get help sending a transmission.  Transmission received. I told her the nurse will review it and call her back.

## 2021-03-09 NOTE — Telephone Encounter (Signed)
Transmission was received. No new alerts since 2018. ILR shows low battery (EOS 01/10/21).   Patient states she was told Dr. Rayann Heman would remove her current device and implant a new device. Patient would call back for an update so when comes into her next apt. She will know what the plan is. Has apt. scheduled 04/18/21.

## 2021-03-21 NOTE — Telephone Encounter (Signed)
Otila Kluver.,  Please do prior authorization for replacement of existing ILR which is at end of service with a new one prior to her next office visit.

## 2021-04-04 DIAGNOSIS — I4891 Unspecified atrial fibrillation: Secondary | ICD-10-CM | POA: Diagnosis not present

## 2021-04-04 DIAGNOSIS — I739 Peripheral vascular disease, unspecified: Secondary | ICD-10-CM | POA: Diagnosis not present

## 2021-04-04 DIAGNOSIS — G4733 Obstructive sleep apnea (adult) (pediatric): Secondary | ICD-10-CM | POA: Diagnosis not present

## 2021-04-04 DIAGNOSIS — I272 Pulmonary hypertension, unspecified: Secondary | ICD-10-CM | POA: Diagnosis not present

## 2021-04-18 ENCOUNTER — Encounter: Payer: Self-pay | Admitting: Internal Medicine

## 2021-04-18 ENCOUNTER — Ambulatory Visit: Payer: Medicare HMO | Admitting: Internal Medicine

## 2021-04-18 ENCOUNTER — Other Ambulatory Visit: Payer: Self-pay

## 2021-04-18 VITALS — BP 112/64 | HR 65 | Ht 61.0 in | Wt 164.2 lb

## 2021-04-18 DIAGNOSIS — E785 Hyperlipidemia, unspecified: Secondary | ICD-10-CM

## 2021-04-18 DIAGNOSIS — G4733 Obstructive sleep apnea (adult) (pediatric): Secondary | ICD-10-CM | POA: Diagnosis not present

## 2021-04-18 DIAGNOSIS — I10 Essential (primary) hypertension: Secondary | ICD-10-CM | POA: Diagnosis not present

## 2021-04-18 DIAGNOSIS — I4892 Unspecified atrial flutter: Secondary | ICD-10-CM | POA: Diagnosis not present

## 2021-04-18 DIAGNOSIS — I480 Paroxysmal atrial fibrillation: Secondary | ICD-10-CM

## 2021-04-18 HISTORY — PX: LOOP RECORDER INSERTION: EP1214

## 2021-04-18 NOTE — Patient Instructions (Addendum)
Medication Instructions:  Your physician recommends that you continue on your current medications as directed. Please refer to the Current Medication list given to you today.  Labwork: None ordered.  Testing/Procedures: None ordered.  Follow-Up:  Your physician wants you to follow-up in: 04/24/22 at 2 pm with Dr. Rayann Heman.    Implantable Loop Recorder Placement, Care After This sheet gives you information about how to care for yourself after your procedure. Your health care provider may also give you more specific instructions. If you have problems or questions, contact your health care provider. What can I expect after the procedure? After the procedure, it is common to have: Soreness or discomfort near the incision. Some swelling or bruising near the incision.  Follow these instructions at home: Incision care   Leave your outer dressing on for 24 hours.  After 24 hours you can remove your outer dressing and shower. Leave glue in place. Remove in 10 days Check your incision area every day for signs of infection. Check for: Redness, swelling, or pain. Fluid or blood. Warmth. Pus or a bad smell. Do not take baths, swim, or use a hot tub until your incision is completely healed. If your wound site starts to bleed apply pressure.      If you have any questions/concerns please call the device clinic at [phone].  Activity  Return to your normal activities.  General instructions Follow instructions from your health care provider about how to manage your implantable loop recorder and transmit the information. Learn how to activate a recording if this is necessary for your type of device. Do not go through a metal detection gate, and do not let someone hold a metal detector over your chest. Show your ID card. Do not have an MRI unless you check with your health care provider first. Take over-the-counter and prescription medicines only as told by your health care provider. Keep  all follow-up visits as told by your health care provider. This is important. Contact a health care provider if: You have redness, swelling, or pain around your incision. You have a fever. You have pain that is not relieved by your pain medicine. You have triggered your device because of fainting (syncope) or because of a heartbeat that feels like it is racing, slow, fluttering, or skipping (palpitations). Get help right away if you have: Chest pain. Difficulty breathing. Summary After the procedure, it is common to have soreness or discomfort near the incision. Change your dressing as told by your health care provider. Follow instructions from your health care provider about how to manage your implantable loop recorder and transmit the information. Keep all follow-up visits as told by your health care provider. This is important. This information is not intended to replace advice given to you by your health care provider. Make sure you discuss any questions you have with your health care provider. Document Released: 10/04/2015 Document Revised: 12/08/2017 Document Reviewed: 12/08/2017 Elsevier Patient Education  2020 [employer].

## 2021-04-18 NOTE — Progress Notes (Signed)
PCP: Kristen Loader, FNP   Primary EP: Dr Albin Felling Theresa Reynolds is a 70 y.o. female who presents today for routine electrophysiology followup.  Since last being seen in our clinic, the patient reports doing very well.  She has recently had dizziness, presyncope.   Today, she denies symptoms of palpitations, chest pain, shortness of breath,  lower extremity edema, dizziness, presyncope, or syncope.  The patient is otherwise without complaint today.   Past Medical History:  Diagnosis Date   Anxiety and depression    s/p suicide attempt 11/11   Aortic insufficiency    mild by echo 10/17   Atrial fibrillation ([facility])    Carotid stenosis, bilateral    0-39% by [place]   Colitis    Hepatitis C    Followed by Dr. Dallas Breeding at [facility].  s/p treatment with Bavoni.   History of shingles    HTN (hypertension)    Hypercholesteremia    Hypersomnia    OSA (obstructive sleep apnea)    Right shoulder pain    Seasonal allergies    Syncope and collapse    Tobacco use    Vitamin D deficiency    Past Surgical History:  Procedure Laterality Date   ATRIAL FIBRILLATION ABLATION N/A 06/05/2017   Procedure: Atrial Fibrillation Ablation;  Surgeon: Thompson Grayer, MD;  Location: [place] [facility];  Service: Cardiovascular;  Laterality: N/A;   BREAST REDUCTION SURGERY  10/1999   With Dr. Towanda Malkin   CESAREAN SECTION     LOOP RECORDER INSERTION N/A 05/07/2017   Procedure: Loop Recorder Insertion;  Surgeon: Thompson Grayer, MD;  Location: [place] [facility];  Service: Cardiovascular;  Laterality: N/A;    ROS- all systems are reviewed and negatives except as per HPI above  Current Outpatient Medications  Medication Sig Dispense Refill   cetirizine (ZYRTEC) 10 MG tablet Take 10 mg by mouth daily as needed for allergies.      ELIQUIS 5 MG TABS tablet TAKE 1 TABLET BY MOUTH TWICE A DAY 60 tablet 6   fluticasone (FLONASE) 50 MCG/ACT nasal spray Place 2 sprays into both nostrils daily as needed for  allergies or rhinitis.     LORazepam (ATIVAN) 1 MG tablet Take 0.5-1 mg by mouth daily as needed for anxiety.      metoprolol succinate (TOPROL-XL) 25 MG 24 hr tablet TAKE 1 TABLET BY MOUTH EVERY DAY 90 tablet 3   Multiple Vitamins-Minerals (EMERGEN-C IMMUNE) PACK Take 1 packet by mouth daily as needed (immune support).      sertraline (ZOLOFT) 50 MG tablet Take 50 mg by mouth daily.  0   simvastatin (ZOCOR) 20 MG tablet Take 20 mg by mouth at bedtime.      Tetrahydrozoline HCl (VISINE OP) Apply 1 drop to eye daily as needed (allergies).     traZODone (DESYREL) 50 MG tablet Take 50 mg by mouth at bedtime.     No current facility-administered medications for this visit.    Physical Exam: Vitals:   04/18/21 1516  BP: 112/64  Pulse: 65  SpO2: 94%  Weight: 164 lb 3.2 oz (74.5 kg)  Height: 5\' 1"  (1.549 m)    GEN- The patient is well appearing, alert and oriented x 3 today.   Head- normocephalic, atraumatic Eyes-  Sclera clear, conjunctiva pink Ears- hearing intact Oropharynx- clear Lungs- Clear to ausculation bilaterally, normal work of breathing Heart- Regular rate and rhythm, no murmurs, rubs or gallops, PMI not laterally displaced GI- soft,  NT, ND, + BS Extremities- no clubbing, cyanosis, or edema  Wt Readings from Last 3 Encounters:  04/18/21 164 lb 3.2 oz (74.5 kg)  04/12/20 165 lb (74.8 kg)  04/24/19 165 lb (74.8 kg)    EKG tracing ordered today is personally reviewed and shows sinus  Assessment and Plan:  Afib Well controlled Her ILR has reached ERI.  She would like to have a new one placed.  I would therefore advise explantation of her long loop recorder and implantation of a new implantable loop recorder for long term arrhythmia monitoring.  Risks and benefits to ILR were discussed at length with the patient today, including but not limited to risks of bleeding and infection.  Extensive device education was performed.  Remote monitoring was also discussed at length  today.  The patient understands and wishes to proceed.  We will proceed at this time with ILR implantation. Chads2vasc score is 3.  Continue [place] therapy  2. HTN Stable No change required today  3. HL Stable No change required today  4. OSA Uses CPAP   Thompson Grayer MD, [facility] 04/18/2021 3:25 PM      DESCRIPTION OF PROCEDURE:  Informed written consent was obtained.  The patient required no sedation for the procedure today.  The patients left chest was prepped and draped.   The skin overlying the existing ILR was infiltrated with lidocaine for local analgesia.  A 0.5-cm incision was made at the implant site.  The previously placed ILR was removed in its entirety using a combination of sharp and blunt dissection.  A new subcutaneous ILR pocket was fashioned using a combination of sharp and blunt dissection.  A Medtronic Reveal Linq model LNQ 2 implantable loop recorder  (SN# [id number]) was then placed into the pocket R waves were very prominent and measured > 0.2 mV. EBL<1 ml.  Dermabond and a sterile dressing were then applied.  There were no early apparent complications.     CONCLUSIONS:   1. Successful explantation of a previously implanted LINQ 1 device with subsequent reimplantation of a Medtronic Reveal LINQ2 implantable loop recorder for afib management and evaluation of dizziness  2. No early apparent complications.   Thompson Grayer MD, [facility] 04/18/2021 3:25 PM

## 2021-04-25 ENCOUNTER — Other Ambulatory Visit: Payer: Self-pay | Admitting: Internal Medicine

## 2021-04-25 NOTE — Telephone Encounter (Signed)
Pt last saw Dr Rayann Heman 04/18/21, last labs 11/29/20 Creat 0.79 at [facility] per [place], age 70, weight 74.5kg, based on specified criteria pt is on appropriate dosage of Eliquis 5mg  BID.  Will refill rx.

## 2021-04-27 DIAGNOSIS — G5603 Carpal tunnel syndrome, bilateral upper limbs: Secondary | ICD-10-CM | POA: Diagnosis not present

## 2021-05-05 DIAGNOSIS — R69 Illness, unspecified: Secondary | ICD-10-CM | POA: Diagnosis not present

## 2021-05-05 DIAGNOSIS — G43109 Migraine with aura, not intractable, without status migrainosus: Secondary | ICD-10-CM | POA: Diagnosis not present

## 2021-05-05 DIAGNOSIS — E782 Mixed hyperlipidemia: Secondary | ICD-10-CM | POA: Diagnosis not present

## 2021-05-05 DIAGNOSIS — M4722 Other spondylosis with radiculopathy, cervical region: Secondary | ICD-10-CM | POA: Diagnosis not present

## 2021-05-05 DIAGNOSIS — I10 Essential (primary) hypertension: Secondary | ICD-10-CM | POA: Diagnosis not present

## 2021-05-05 DIAGNOSIS — I480 Paroxysmal atrial fibrillation: Secondary | ICD-10-CM | POA: Diagnosis not present

## 2021-05-12 ENCOUNTER — Ambulatory Visit (INDEPENDENT_AMBULATORY_CARE_PROVIDER_SITE_OTHER): Payer: Medicare HMO

## 2021-05-12 DIAGNOSIS — R55 Syncope and collapse: Secondary | ICD-10-CM

## 2021-05-12 LAB — CUP PACEART REMOTE DEVICE CHECK
Date Time Interrogation Session: 20220706235324
Implantable Pulse Generator Implant Date: 20220613

## 2021-06-03 NOTE — Progress Notes (Signed)
Carelink Summary Report / Loop Recorder 

## 2021-06-07 DIAGNOSIS — G4733 Obstructive sleep apnea (adult) (pediatric): Secondary | ICD-10-CM | POA: Diagnosis not present

## 2021-06-14 ENCOUNTER — Ambulatory Visit (INDEPENDENT_AMBULATORY_CARE_PROVIDER_SITE_OTHER): Payer: Medicare HMO

## 2021-06-14 DIAGNOSIS — R55 Syncope and collapse: Secondary | ICD-10-CM | POA: Diagnosis not present

## 2021-06-14 LAB — CUP PACEART REMOTE DEVICE CHECK
Date Time Interrogation Session: 20220808235014
Implantable Pulse Generator Implant Date: 20220613

## 2021-07-07 NOTE — Progress Notes (Signed)
Carelink Summary Report / Loop Recorder 

## 2021-07-13 DIAGNOSIS — R194 Change in bowel habit: Secondary | ICD-10-CM | POA: Diagnosis not present

## 2021-07-13 DIAGNOSIS — K648 Other hemorrhoids: Secondary | ICD-10-CM | POA: Diagnosis not present

## 2021-07-18 ENCOUNTER — Ambulatory Visit (INDEPENDENT_AMBULATORY_CARE_PROVIDER_SITE_OTHER): Payer: Medicare HMO

## 2021-07-18 DIAGNOSIS — R55 Syncope and collapse: Secondary | ICD-10-CM

## 2021-07-18 LAB — CUP PACEART REMOTE DEVICE CHECK
Date Time Interrogation Session: 20220910235226
Implantable Pulse Generator Implant Date: 20220613

## 2021-07-26 NOTE — Progress Notes (Signed)
Carelink Summary Report / Loop Recorder 

## 2021-07-29 DIAGNOSIS — R69 Illness, unspecified: Secondary | ICD-10-CM | POA: Diagnosis not present

## 2021-07-29 DIAGNOSIS — G43109 Migraine with aura, not intractable, without status migrainosus: Secondary | ICD-10-CM | POA: Diagnosis not present

## 2021-07-29 DIAGNOSIS — E782 Mixed hyperlipidemia: Secondary | ICD-10-CM | POA: Diagnosis not present

## 2021-07-29 DIAGNOSIS — M4722 Other spondylosis with radiculopathy, cervical region: Secondary | ICD-10-CM | POA: Diagnosis not present

## 2021-07-29 DIAGNOSIS — I10 Essential (primary) hypertension: Secondary | ICD-10-CM | POA: Diagnosis not present

## 2021-07-29 DIAGNOSIS — I480 Paroxysmal atrial fibrillation: Secondary | ICD-10-CM | POA: Diagnosis not present

## 2021-08-02 DIAGNOSIS — G4733 Obstructive sleep apnea (adult) (pediatric): Secondary | ICD-10-CM | POA: Diagnosis not present

## 2021-08-22 ENCOUNTER — Ambulatory Visit (INDEPENDENT_AMBULATORY_CARE_PROVIDER_SITE_OTHER): Payer: Medicare HMO

## 2021-08-22 DIAGNOSIS — R55 Syncope and collapse: Secondary | ICD-10-CM | POA: Diagnosis not present

## 2021-08-23 LAB — CUP PACEART REMOTE DEVICE CHECK
Date Time Interrogation Session: 20221013235421
Implantable Pulse Generator Implant Date: 20220613

## 2021-08-31 NOTE — Progress Notes (Signed)
Carelink Summary Report / Loop Recorder 

## 2021-09-02 DIAGNOSIS — I10 Essential (primary) hypertension: Secondary | ICD-10-CM | POA: Diagnosis not present

## 2021-09-02 DIAGNOSIS — R69 Illness, unspecified: Secondary | ICD-10-CM | POA: Diagnosis not present

## 2021-09-02 DIAGNOSIS — Z23 Encounter for immunization: Secondary | ICD-10-CM | POA: Diagnosis not present

## 2021-09-02 DIAGNOSIS — I480 Paroxysmal atrial fibrillation: Secondary | ICD-10-CM | POA: Diagnosis not present

## 2021-09-02 DIAGNOSIS — D6869 Other thrombophilia: Secondary | ICD-10-CM | POA: Diagnosis not present

## 2021-09-02 DIAGNOSIS — R7303 Prediabetes: Secondary | ICD-10-CM | POA: Diagnosis not present

## 2021-09-02 DIAGNOSIS — E782 Mixed hyperlipidemia: Secondary | ICD-10-CM | POA: Diagnosis not present

## 2021-09-08 DIAGNOSIS — G4733 Obstructive sleep apnea (adult) (pediatric): Secondary | ICD-10-CM | POA: Diagnosis not present

## 2021-09-14 DIAGNOSIS — G4733 Obstructive sleep apnea (adult) (pediatric): Secondary | ICD-10-CM | POA: Diagnosis not present

## 2021-09-21 LAB — CUP PACEART REMOTE DEVICE CHECK
Date Time Interrogation Session: 20221115235333
Implantable Pulse Generator Implant Date: 20220613

## 2021-09-26 ENCOUNTER — Ambulatory Visit (INDEPENDENT_AMBULATORY_CARE_PROVIDER_SITE_OTHER): Payer: Medicare HMO

## 2021-09-26 DIAGNOSIS — R55 Syncope and collapse: Secondary | ICD-10-CM

## 2021-10-03 DIAGNOSIS — I4891 Unspecified atrial fibrillation: Secondary | ICD-10-CM | POA: Diagnosis not present

## 2021-10-05 NOTE — Progress Notes (Signed)
Carelink Summary Report / Loop Recorder 

## 2021-10-10 DIAGNOSIS — I6523 Occlusion and stenosis of bilateral carotid arteries: Secondary | ICD-10-CM | POA: Diagnosis not present

## 2021-10-10 DIAGNOSIS — I272 Pulmonary hypertension, unspecified: Secondary | ICD-10-CM | POA: Diagnosis not present

## 2021-10-10 DIAGNOSIS — I739 Peripheral vascular disease, unspecified: Secondary | ICD-10-CM | POA: Diagnosis not present

## 2021-10-10 DIAGNOSIS — I4891 Unspecified atrial fibrillation: Secondary | ICD-10-CM | POA: Diagnosis not present

## 2021-10-24 ENCOUNTER — Ambulatory Visit (INDEPENDENT_AMBULATORY_CARE_PROVIDER_SITE_OTHER): Payer: Medicare HMO

## 2021-10-24 DIAGNOSIS — R55 Syncope and collapse: Secondary | ICD-10-CM

## 2021-10-25 LAB — CUP PACEART REMOTE DEVICE CHECK
Date Time Interrogation Session: 20221218235237
Implantable Pulse Generator Implant Date: 20220613

## 2021-11-02 NOTE — Progress Notes (Signed)
Carelink Summary Report / Loop Recorder 

## 2021-11-06 HISTORY — PX: MENISCUS REPAIR: SHX5179

## 2021-11-28 ENCOUNTER — Ambulatory Visit (INDEPENDENT_AMBULATORY_CARE_PROVIDER_SITE_OTHER): Payer: Medicare HMO

## 2021-11-28 DIAGNOSIS — R55 Syncope and collapse: Secondary | ICD-10-CM | POA: Diagnosis not present

## 2021-11-28 LAB — CUP PACEART REMOTE DEVICE CHECK
Date Time Interrogation Session: 20230122231142
Implantable Pulse Generator Implant Date: 20220613

## 2021-12-09 NOTE — Progress Notes (Signed)
Carelink Summary Report / Loop Recorder 

## 2021-12-16 DIAGNOSIS — G4733 Obstructive sleep apnea (adult) (pediatric): Secondary | ICD-10-CM | POA: Diagnosis not present

## 2021-12-30 DIAGNOSIS — R69 Illness, unspecified: Secondary | ICD-10-CM | POA: Diagnosis not present

## 2021-12-30 DIAGNOSIS — F411 Generalized anxiety disorder: Secondary | ICD-10-CM | POA: Diagnosis not present

## 2021-12-30 DIAGNOSIS — F3341 Major depressive disorder, recurrent, in partial remission: Secondary | ICD-10-CM | POA: Diagnosis not present

## 2022-01-01 LAB — CUP PACEART REMOTE DEVICE CHECK
Date Time Interrogation Session: 20230224230609
Implantable Pulse Generator Implant Date: 20220613

## 2022-01-02 ENCOUNTER — Ambulatory Visit (INDEPENDENT_AMBULATORY_CARE_PROVIDER_SITE_OTHER): Payer: Medicare HMO

## 2022-01-02 DIAGNOSIS — R55 Syncope and collapse: Secondary | ICD-10-CM

## 2022-01-06 ENCOUNTER — Telehealth: Payer: Self-pay

## 2022-01-06 NOTE — Telephone Encounter (Signed)
Patient called in concerning the 2 AF episodes from the Jan. Scheduled remote transmission longest episode 1hr53min + [place], patient wanted to know if she should see Dr. Quentin Ore sooner informed her that her 12/30/20 remote showed no AF, patient very concerned about going back into AF informed her that an AF ablation was a treatment not a cure for AF, patient asked about increasing her Metoprolol informed her that would not keep her out of atrial fibrillation that it would just control the rates. Informed patient that not smoking, limiting caffeine intake and alcohol intake, reducing stress and getting enough sleep were the lifestyle factors she could control to keep AF to minimum. Patient voiced understanding.  ?

## 2022-01-09 NOTE — Progress Notes (Signed)
Carelink Summary Report / Loop Recorder 

## 2022-01-17 DIAGNOSIS — L570 Actinic keratosis: Secondary | ICD-10-CM | POA: Diagnosis not present

## 2022-01-17 DIAGNOSIS — L298 Other pruritus: Secondary | ICD-10-CM | POA: Diagnosis not present

## 2022-01-17 DIAGNOSIS — D2371 Other benign neoplasm of skin of right lower limb, including hip: Secondary | ICD-10-CM | POA: Diagnosis not present

## 2022-01-17 DIAGNOSIS — L820 Inflamed seborrheic keratosis: Secondary | ICD-10-CM | POA: Diagnosis not present

## 2022-01-17 DIAGNOSIS — L821 Other seborrheic keratosis: Secondary | ICD-10-CM | POA: Diagnosis not present

## 2022-01-17 DIAGNOSIS — L538 Other specified erythematous conditions: Secondary | ICD-10-CM | POA: Diagnosis not present

## 2022-01-17 DIAGNOSIS — L814 Other melanin hyperpigmentation: Secondary | ICD-10-CM | POA: Diagnosis not present

## 2022-01-17 DIAGNOSIS — R202 Paresthesia of skin: Secondary | ICD-10-CM | POA: Diagnosis not present

## 2022-01-17 DIAGNOSIS — R208 Other disturbances of skin sensation: Secondary | ICD-10-CM | POA: Diagnosis not present

## 2022-01-26 ENCOUNTER — Other Ambulatory Visit: Payer: Self-pay | Admitting: Internal Medicine

## 2022-01-26 DIAGNOSIS — E782 Mixed hyperlipidemia: Secondary | ICD-10-CM | POA: Diagnosis not present

## 2022-01-26 DIAGNOSIS — I10 Essential (primary) hypertension: Secondary | ICD-10-CM | POA: Diagnosis not present

## 2022-01-26 DIAGNOSIS — I480 Paroxysmal atrial fibrillation: Secondary | ICD-10-CM | POA: Diagnosis not present

## 2022-01-26 DIAGNOSIS — R69 Illness, unspecified: Secondary | ICD-10-CM | POA: Diagnosis not present

## 2022-01-26 NOTE — Telephone Encounter (Signed)
Prescription refill request for Eliquis received. ?Indication: Afib  ?Last office visit: 04/18/21 ([place]) ?Scr: 0.75 (09/02/21 via [place])  ?Age: 71 ?Weight: 74.5kg ? ?Appropriate dose and refill sent to requested pharmacy.  ?

## 2022-02-06 ENCOUNTER — Ambulatory Visit (INDEPENDENT_AMBULATORY_CARE_PROVIDER_SITE_OTHER): Payer: Medicare HMO

## 2022-02-06 DIAGNOSIS — R55 Syncope and collapse: Secondary | ICD-10-CM | POA: Diagnosis not present

## 2022-02-06 LAB — CUP PACEART REMOTE DEVICE CHECK
Date Time Interrogation Session: 20230331230733
Implantable Pulse Generator Implant Date: 20220613

## 2022-02-07 DIAGNOSIS — K648 Other hemorrhoids: Secondary | ICD-10-CM | POA: Diagnosis not present

## 2022-02-07 DIAGNOSIS — K591 Functional diarrhea: Secondary | ICD-10-CM | POA: Diagnosis not present

## 2022-02-20 NOTE — Progress Notes (Signed)
Carelink Summary Report / Loop Recorder 

## 2022-02-27 DIAGNOSIS — H5203 Hypermetropia, bilateral: Secondary | ICD-10-CM | POA: Diagnosis not present

## 2022-03-09 LAB — CUP PACEART REMOTE DEVICE CHECK
Date Time Interrogation Session: 20230503230746
Implantable Pulse Generator Implant Date: 20220613

## 2022-03-10 DIAGNOSIS — Z6831 Body mass index (BMI) 31.0-31.9, adult: Secondary | ICD-10-CM | POA: Diagnosis not present

## 2022-03-10 DIAGNOSIS — I272 Pulmonary hypertension, unspecified: Secondary | ICD-10-CM | POA: Diagnosis not present

## 2022-03-10 DIAGNOSIS — G4733 Obstructive sleep apnea (adult) (pediatric): Secondary | ICD-10-CM | POA: Diagnosis not present

## 2022-03-10 DIAGNOSIS — J309 Allergic rhinitis, unspecified: Secondary | ICD-10-CM | POA: Diagnosis not present

## 2022-03-10 DIAGNOSIS — I4891 Unspecified atrial fibrillation: Secondary | ICD-10-CM | POA: Diagnosis not present

## 2022-03-13 ENCOUNTER — Ambulatory Visit (INDEPENDENT_AMBULATORY_CARE_PROVIDER_SITE_OTHER): Payer: Medicare HMO

## 2022-03-13 DIAGNOSIS — R55 Syncope and collapse: Secondary | ICD-10-CM

## 2022-03-13 DIAGNOSIS — I480 Paroxysmal atrial fibrillation: Secondary | ICD-10-CM

## 2022-03-17 DIAGNOSIS — G4733 Obstructive sleep apnea (adult) (pediatric): Secondary | ICD-10-CM | POA: Diagnosis not present

## 2022-03-31 DIAGNOSIS — G4733 Obstructive sleep apnea (adult) (pediatric): Secondary | ICD-10-CM | POA: Diagnosis not present

## 2022-04-04 NOTE — Progress Notes (Signed)
Carelink Summary Report / Loop Recorder 

## 2022-04-07 DIAGNOSIS — D6869 Other thrombophilia: Secondary | ICD-10-CM | POA: Diagnosis not present

## 2022-04-07 DIAGNOSIS — H6122 Impacted cerumen, left ear: Secondary | ICD-10-CM | POA: Diagnosis not present

## 2022-04-07 DIAGNOSIS — R7303 Prediabetes: Secondary | ICD-10-CM | POA: Diagnosis not present

## 2022-04-07 DIAGNOSIS — E782 Mixed hyperlipidemia: Secondary | ICD-10-CM | POA: Diagnosis not present

## 2022-04-07 DIAGNOSIS — E559 Vitamin D deficiency, unspecified: Secondary | ICD-10-CM | POA: Diagnosis not present

## 2022-04-07 DIAGNOSIS — I480 Paroxysmal atrial fibrillation: Secondary | ICD-10-CM | POA: Diagnosis not present

## 2022-04-07 DIAGNOSIS — Z0000 Encounter for general adult medical examination without abnormal findings: Secondary | ICD-10-CM | POA: Diagnosis not present

## 2022-04-07 DIAGNOSIS — Z23 Encounter for immunization: Secondary | ICD-10-CM | POA: Diagnosis not present

## 2022-04-07 DIAGNOSIS — R69 Illness, unspecified: Secondary | ICD-10-CM | POA: Diagnosis not present

## 2022-04-07 DIAGNOSIS — I10 Essential (primary) hypertension: Secondary | ICD-10-CM | POA: Diagnosis not present

## 2022-04-10 DIAGNOSIS — R7303 Prediabetes: Secondary | ICD-10-CM | POA: Diagnosis not present

## 2022-04-10 DIAGNOSIS — I480 Paroxysmal atrial fibrillation: Secondary | ICD-10-CM | POA: Diagnosis not present

## 2022-04-10 DIAGNOSIS — E782 Mixed hyperlipidemia: Secondary | ICD-10-CM | POA: Diagnosis not present

## 2022-04-10 DIAGNOSIS — E559 Vitamin D deficiency, unspecified: Secondary | ICD-10-CM | POA: Diagnosis not present

## 2022-04-10 DIAGNOSIS — Z0000 Encounter for general adult medical examination without abnormal findings: Secondary | ICD-10-CM | POA: Diagnosis not present

## 2022-04-10 DIAGNOSIS — I10 Essential (primary) hypertension: Secondary | ICD-10-CM | POA: Diagnosis not present

## 2022-04-17 ENCOUNTER — Ambulatory Visit (INDEPENDENT_AMBULATORY_CARE_PROVIDER_SITE_OTHER): Payer: Medicare HMO

## 2022-04-17 DIAGNOSIS — R55 Syncope and collapse: Secondary | ICD-10-CM | POA: Diagnosis not present

## 2022-04-18 LAB — CUP PACEART REMOTE DEVICE CHECK
Date Time Interrogation Session: 20230605230621
Implantable Pulse Generator Implant Date: 20220613

## 2022-04-20 ENCOUNTER — Other Ambulatory Visit: Payer: Self-pay | Admitting: Internal Medicine

## 2022-04-24 ENCOUNTER — Ambulatory Visit: Payer: Medicare HMO | Admitting: Internal Medicine

## 2022-04-25 ENCOUNTER — Encounter: Payer: Self-pay | Admitting: Cardiology

## 2022-04-25 ENCOUNTER — Ambulatory Visit (INDEPENDENT_AMBULATORY_CARE_PROVIDER_SITE_OTHER): Payer: Medicare HMO | Admitting: Cardiology

## 2022-04-25 VITALS — BP 122/72 | HR 78 | Ht 61.0 in | Wt 166.0 lb

## 2022-04-25 DIAGNOSIS — I480 Paroxysmal atrial fibrillation: Secondary | ICD-10-CM | POA: Diagnosis not present

## 2022-04-25 DIAGNOSIS — I10 Essential (primary) hypertension: Secondary | ICD-10-CM

## 2022-04-25 NOTE — Patient Instructions (Addendum)
Medication Instructions:  Increase metoprolol succinate to 50 mg daily Your physician recommends that you continue on your current medications as directed. Please refer to the Current Medication list given to you today. *If you need a refill on your cardiac medications before your next appointment, please call your pharmacy*  Lab Work: None. If you have labs (blood work) drawn today and your tests are completely normal, you will receive your results only by: [place] (if you have MyChart) OR A paper copy in the mail If you have any lab test that is abnormal or we need to change your treatment, we will call you to review the results.  Testing/Procedures: None.  Follow-Up: At [facility], you and your health needs are our priority.  As part of our continuing mission to provide you with exceptional heart care, we have created designated Provider Care Teams.  These Care Teams include your primary Cardiologist (physician) and Advanced Practice Providers (APPs -  Physician Assistants and Nurse Practitioners) who all work together to provide you with the care you need, when you need it.  Your physician wants you to follow-up in: 6 months with one of the following Advanced Practice Providers on your designated Care Team:    Tommye Standard, [place] "Jonni Sanger" [place], [place]   You will receive a reminder letter in the mail two months in advance. If you don't receive a letter, please call our office to schedule the follow-up appointment.  We recommend signing up for the patient portal called "MyChart".  Sign up information is provided on this After Visit Summary.  MyChart is used to connect with patients for Virtual Visits (Telemedicine).  Patients are able to view lab/test results, encounter notes, upcoming appointments, etc.  Non-urgent messages can be sent to your provider as well.   To learn more about what you can do with MyChart, go to [email or web address].    Any Other Special  Instructions Will Be Listed Below (If Applicable).

## 2022-04-25 NOTE — Progress Notes (Signed)
Electrophysiology Office Note:    Date:  04/25/2022   ID:  Theresa Reynolds, DOB Nov 15, 1950, MRN [id number]  PCP:  Theresa Loader, FNP  [facility] Cardiologist:  None  [facility] Electrophysiologist:  Theresa Epley, MD   Referring MD: Theresa Loader, FNP   Chief Complaint: Follow-up for ILR MDT  History of Present Illness:    Theresa Reynolds is a 71 y.o. female who presents for follow-up of their ILR MDT. Their medical history includes aortic insufficiency, atrial fibrillation, bilateral carotid stenosis, hypertension, hyperlipidemia, hepatitis C, hypersomnia, OSA on CPAP, syncope, tobacco use, and shingles.  She was previously followed by Dr. Rayann Reynolds, last seen by him in clinic on 04/18/2021. At that time she underwent replacement of her ILR as it had reached ERI. Successful explantation of a previously implanted LINQ 1 device with subsequent reimplantation of a Medtronic Reveal LINQ2 implantable loop recorder for afib management and evaluation of dizziness.  Today, she states that she noticed their was an episode of Afib noted in January per her Mychart. Otherwise she has not been aware of any arrhythmic episodes.   Her blood pressure is elevated in clinic at 158/72, but decreased to 122/72 on recheck. She does not routinely monitor her blood pressure at home.  She denies any palpitations, chest pain, shortness of breath, or peripheral edema. No lightheadedness, headaches, orthopnea, or PND.     Past Medical History:  Diagnosis Date   Anxiety and depression    s/p suicide attempt 11/11   Aortic insufficiency    mild by echo 10/17   Atrial fibrillation (HCC)    Carotid stenosis, bilateral    0-39% by [place]   Colitis    Hepatitis C    Followed by Dr. Dallas Reynolds at [facility].  s/p treatment with Bavoni.   History of shingles    HTN (hypertension)    Hypercholesteremia    Hypersomnia    OSA (obstructive sleep apnea)    Right shoulder pain    Seasonal  allergies    Syncope and collapse    Tobacco use    Vitamin D deficiency     Past Surgical History:  Procedure Laterality Date   ATRIAL FIBRILLATION ABLATION N/A 06/05/2017   Procedure: Atrial Fibrillation Ablation;  Surgeon: Thompson Grayer, MD;  Location: [place] [facility];  Service: Cardiovascular;  Laterality: N/A;   BREAST REDUCTION SURGERY  10/07/1999   With Dr. Towanda Malkin   CESAREAN SECTION     LOOP RECORDER INSERTION N/A 05/07/2017   Procedure: Loop Recorder Insertion;  Surgeon: Thompson Grayer, MD;  Location: [place] [facility];  Service: Cardiovascular;  Laterality: N/A;   LOOP RECORDER INSERTION  04/18/2021   removal of previously implanted TDDU2 with subsequent placement of a Medtronic Reveal Linq model LNQ 2 implantable loop recorder  (SN# [id number])    Current Medications: Current Meds  Medication Sig   apixaban (ELIQUIS) 5 MG TABS tablet TAKE ONE TABLET BY MOUTH TWICE DAILY   cetirizine (ZYRTEC) 10 MG tablet Take 10 mg by mouth daily as needed for allergies.    fluticasone (FLONASE) 50 MCG/ACT nasal spray Place 2 sprays into both nostrils daily as needed for allergies or rhinitis.   LORazepam (ATIVAN) 1 MG tablet Take 0.5-1 mg by mouth daily as needed for anxiety.    metoprolol succinate (TOPROL-XL) 25 MG 24 hr tablet TAKE ONE TABLET BY MOUTH ONCE DAILY   Multiple Vitamins-Minerals (EMERGEN-C IMMUNE) PACK Take 1 packet by mouth daily as  needed (immune support).    sertraline (ZOLOFT) 50 MG tablet Take 50 mg by mouth daily.   simvastatin (ZOCOR) 20 MG tablet Take 20 mg by mouth at bedtime.    Tetrahydrozoline HCl (VISINE OP) Apply 1 drop to eye daily as needed (allergies).   traZODone (DESYREL) 50 MG tablet Take 50 mg by mouth at bedtime.     Allergies:   Cymbalta [duloxetine hcl]   Social History   Socioeconomic History   Marital status: Divorced    Spouse name: Not on file   Number of children: Not on file   Years of education: Not on file   Highest education  level: Not on file  Occupational History   Not on file  Tobacco Use   Smoking status: Every Day    Packs/day: 1.00    Years: 30.00    Total pack years: 30.00    Types: Cigarettes   Smokeless tobacco: Never  Substance and Sexual Activity   Alcohol use: Yes   Drug use: No   Sexual activity: Not on file  Other Topics Concern   Not on file  Social History Narrative   Not on file   Social Determinants of Health   Financial Resource Strain: Not on file  Food Insecurity: Not on file  Transportation Needs: Not on file  Physical Activity: Not on file  Stress: Not on file  Social Connections: Not on file     Family History: The patient's family history includes Atrial fibrillation in her brother and mother; Heart disease in her maternal grandfather; Mitral valve prolapse in her mother.  ROS:   Please see the history of present illness.    All other systems reviewed and are negative.  EKGs/Labs/Other Studies Reviewed:    The following studies were reviewed today:  Loop recorder interrogation personally reviewed No new episodes of atrial fibrillation since January 2.  Longest episode of atrial fibrillation on January 2 lasted 1 hour 32 minutes with a median ventricular rate of 120 bpm.  Review of EGM does confirm atrial fibrillation with rapid ventricular rate.  06/05/2017  Atrial Fibrillation Ablation: CONCLUSIONS: 1. Sinus rhythm upon presentation.   2. Intracardiac echo reveals a moderate sized left atrium with a common ostium to the left pulmonary veins without evidence of pulmonary vein stenosis. 3. Successful electrical isolation and anatomical encircling of all four pulmonary veins with radiofrequency current.    4. Cavo-tricuspid isthmus ablation was performed  5. No inducible arrhythmias following ablation both on and off of Isuprel 6. No early apparent complications.  06/04/2017  Cardiac Gated CTA: FINDINGS: There was mild biatrial enlargement. There was no ASD/VSD  or pericardial effusion There was no LAA thrombus. The esophagus coursed closest to the LLPV ostium The pulmonary veins drained   Normally into the LA with no anomalies   Calcium Score:  348 dense calcium seen in proximal and mid RCA/LAD   LUPV:  Ostium 13.7 mm    area 1.8 cm2   LLPV:   Ostium 14 mm  area 1.7 cm2   RUPV:  Ostium 18.7 mm  area 2.7 cm2   RLPV:  Ostium 17.6 mm  area 3.8 cm2   IMPRESSION: 1.  No LAA thrombus   2.  Mild bi atrial enlargement   3.  Calcium score 348 which is 94th percentile for age and sex   7.  No ASD/VSD or pericardial effusion   5.  Esophagus courses closest to the RLPV ostium   6. Normal pulmonary  vein anatomy see body of report for measurements  08/14/2016  Echo: LVEF 60-65%  EKG:   EKG is personally reviewed.  04/25/2022:  EKG was not ordered.    Recent Labs: No results found for requested labs within last 365 days.   Recent Lipid Panel No results found for: "CHOL", "TRIG", "HDL", "CHOLHDL", "VLDL", "LDLCALC", "LDLDIRECT"  Physical Exam:    VS:  BP 122/72 (BP Location: Left Arm, Patient Position: Sitting, Cuff Size: Normal)   Pulse 78   Ht '5\' 1"'$  (1.549 m)   Wt 166 lb (75.3 kg)   SpO2 98%   BMI 31.37 kg/m     Wt Readings from Last 3 Encounters:  04/25/22 166 lb (75.3 kg)  04/18/21 164 lb 3.2 oz (74.5 kg)  04/12/20 165 lb (74.8 kg)     GEN: Well nourished, well developed in no acute distress HEENT: Normal NECK: No JVD; No carotid bruits LYMPHATICS: No lymphadenopathy CARDIAC: RRR, no murmurs, rubs, gallops; Device pocket well healed. RESPIRATORY:  Clear to auscultation without rales, wheezing or rhonchi  ABDOMEN: Soft, non-tender, non-distended MUSCULOSKELETAL:  No edema; No deformity  SKIN: Warm and dry NEUROLOGIC:  Alert and oriented x 3 PSYCHIATRIC:  Normal affect       ASSESSMENT:    1. Paroxysmal atrial fibrillation (HCC)   2. Essential hypertension    PLAN:    In order of problems listed  above:  #Paroxysmal atrial fibrillation Patient is doing well without recent recurrence of atrial fibrillation.  On Eliquis and metoprolol.  We will increase the metoprolol to 50 mg by mouth once daily to hopefully further suppress any further atrial fibrillation episodes.  She will continue taking Eliquis for stroke prophylaxis.  #Hypertension Recheck of blood pressure okay.  Continue current medical therapy.  Have recommended that she check her blood pressures 1-2 times per week at home and record these values.  The issue brought to her primary care physician for furth medication regimen titration.   Follow-up in 6 months with APP.  Medication Adjustments/Labs and Tests Ordered: Current medicines are reviewed at length with the patient today.  Concerns regarding medicines are outlined above.  No orders of the defined types were placed in this encounter.  No orders of the defined types were placed in this encounter.   I,Mathew Stumpf,acting as a Education administrator for Theresa Epley, MD.,have documented all relevant documentation on the behalf of Theresa Epley, MD,as directed by  Theresa Epley, MD while in the presence of Theresa Epley, MD.  I, Theresa Epley, MD, have reviewed all documentation for this visit. The documentation on 04/25/22 for the exam, diagnosis, procedures, and orders are all accurate and complete.   Signed, Hilton Cork. Quentin Ore, MD, [facility], [facility] 04/25/2022 9:59 PM    Electrophysiology [facility]

## 2022-04-26 ENCOUNTER — Telehealth: Payer: Self-pay | Admitting: Cardiology

## 2022-04-26 MED ORDER — METOPROLOL SUCCINATE ER 50 MG PO TB24: 50.0000 mg | ORAL_TABLET | Freq: Every day | ORAL | 3 refills | Status: DC

## 2022-04-26 NOTE — Addendum Note (Signed)
Addended by: Darrell Jewel on: 04/26/2022 01:15 PM   Modules accepted: Orders

## 2022-04-26 NOTE — Telephone Encounter (Signed)
Pt c/o medication issue:  1. Name of Medication: metoprolol succinate (TOPROL-XL) 25 MG 24 hr tablet  2. How are you currently taking this medication (dosage and times per day)? TAKE ONE TABLET BY MOUTH ONCE DAILY  3. Are you having a reaction (difficulty breathing--STAT)? no  4. What is your medication issue? From notes on 6/20 patient medication is suppose to be Increase metoprolol succinate to 50 mg daily. They are needing a new prescription sent over saying this. Please advise

## 2022-04-26 NOTE — Telephone Encounter (Signed)
Office note corrected with added medication.

## 2022-05-01 DIAGNOSIS — G4733 Obstructive sleep apnea (adult) (pediatric): Secondary | ICD-10-CM | POA: Diagnosis not present

## 2022-05-04 NOTE — Progress Notes (Signed)
Carelink Summary Report / Loop Recorder 

## 2022-05-18 LAB — CUP PACEART REMOTE DEVICE CHECK
Date Time Interrogation Session: 20230708230610
Implantable Pulse Generator Implant Date: 20220613

## 2022-05-22 ENCOUNTER — Ambulatory Visit (INDEPENDENT_AMBULATORY_CARE_PROVIDER_SITE_OTHER): Payer: Medicare HMO

## 2022-05-22 DIAGNOSIS — I480 Paroxysmal atrial fibrillation: Secondary | ICD-10-CM

## 2022-05-22 DIAGNOSIS — R55 Syncope and collapse: Secondary | ICD-10-CM

## 2022-05-31 DIAGNOSIS — G4733 Obstructive sleep apnea (adult) (pediatric): Secondary | ICD-10-CM | POA: Diagnosis not present

## 2022-06-08 DIAGNOSIS — M25562 Pain in left knee: Secondary | ICD-10-CM | POA: Diagnosis not present

## 2022-06-09 DIAGNOSIS — M25562 Pain in left knee: Secondary | ICD-10-CM | POA: Diagnosis not present

## 2022-06-20 DIAGNOSIS — G4733 Obstructive sleep apnea (adult) (pediatric): Secondary | ICD-10-CM | POA: Diagnosis not present

## 2022-06-22 NOTE — Progress Notes (Signed)
Carelink Summary Report / Loop Recorder 

## 2022-06-23 DIAGNOSIS — G4733 Obstructive sleep apnea (adult) (pediatric): Secondary | ICD-10-CM | POA: Diagnosis not present

## 2022-06-23 DIAGNOSIS — I4891 Unspecified atrial fibrillation: Secondary | ICD-10-CM | POA: Diagnosis not present

## 2022-06-23 DIAGNOSIS — Z6830 Body mass index (BMI) 30.0-30.9, adult: Secondary | ICD-10-CM | POA: Diagnosis not present

## 2022-06-23 DIAGNOSIS — J309 Allergic rhinitis, unspecified: Secondary | ICD-10-CM | POA: Diagnosis not present

## 2022-06-23 DIAGNOSIS — I272 Pulmonary hypertension, unspecified: Secondary | ICD-10-CM | POA: Diagnosis not present

## 2022-06-26 ENCOUNTER — Ambulatory Visit (INDEPENDENT_AMBULATORY_CARE_PROVIDER_SITE_OTHER): Payer: Medicare HMO

## 2022-06-26 DIAGNOSIS — I480 Paroxysmal atrial fibrillation: Secondary | ICD-10-CM

## 2022-06-27 ENCOUNTER — Telehealth: Payer: Self-pay | Admitting: Cardiology

## 2022-06-27 LAB — CUP PACEART REMOTE DEVICE CHECK
Date Time Interrogation Session: 20230820231716
Implantable Pulse Generator Implant Date: 20220613

## 2022-06-27 NOTE — Telephone Encounter (Signed)
Will route to [place] to see if there is any assistance she can provide.

## 2022-06-27 NOTE — Telephone Encounter (Signed)
Called pt in regards to Eliquis.  Reports [facility] said price of Eliquis would be about $500. Can not afford medication so was given information for patient assistance.  Is working with PCP office to complete patient assistance.  Has to turn in tax forms and insurance information. Is requesting samples.   Was told by [facility] don't have any samples to give out.  Only has 1 pill left.  Was able to get 1 week worth of samples.  Pt advised to call [facility] to f/u application tell them needs STAT f/u as pt can not go with out taking Eliquis.  Not taking medication places at greater risk of a stroke.  Will pick up samples tomorrow morning 06/28/22.

## 2022-06-27 NOTE — Telephone Encounter (Signed)
Pt c/o medication issue:  1. Name of Medication: apixaban (ELIQUIS) 5 MG TABS tablet  2. How are you currently taking this medication (dosage and times per day)? TAKE ONE TABLET BY MOUTH TWICE DAILY  3. Are you having a reaction (difficulty breathing--STAT)? no  4. What is your medication issue? Calling to see if our office have any samples. She is in the donut whole and working on Clinical cytogeneticist with the medication. Please advise

## 2022-06-28 DIAGNOSIS — S83249A Other tear of medial meniscus, current injury, unspecified knee, initial encounter: Secondary | ICD-10-CM | POA: Insufficient documentation

## 2022-06-28 DIAGNOSIS — S83242A Other tear of medial meniscus, current injury, left knee, initial encounter: Secondary | ICD-10-CM | POA: Diagnosis not present

## 2022-06-29 ENCOUNTER — Other Ambulatory Visit: Payer: Self-pay | Admitting: Family Medicine

## 2022-06-29 DIAGNOSIS — E2839 Other primary ovarian failure: Secondary | ICD-10-CM

## 2022-06-29 DIAGNOSIS — Z1231 Encounter for screening mammogram for malignant neoplasm of breast: Secondary | ICD-10-CM

## 2022-06-29 DIAGNOSIS — E782 Mixed hyperlipidemia: Secondary | ICD-10-CM | POA: Diagnosis not present

## 2022-06-29 DIAGNOSIS — Z13820 Encounter for screening for osteoporosis: Secondary | ICD-10-CM

## 2022-06-29 DIAGNOSIS — I10 Essential (primary) hypertension: Secondary | ICD-10-CM | POA: Diagnosis not present

## 2022-06-29 DIAGNOSIS — R69 Illness, unspecified: Secondary | ICD-10-CM | POA: Diagnosis not present

## 2022-07-01 DIAGNOSIS — G4733 Obstructive sleep apnea (adult) (pediatric): Secondary | ICD-10-CM | POA: Diagnosis not present

## 2022-07-11 ENCOUNTER — Telehealth: Payer: Self-pay

## 2022-07-11 NOTE — Telephone Encounter (Signed)
**Note De-Identified Bright Spielmann Obfuscation** The pt left her completed [employer] application for Eliquis asst at the office with documents (no oop RX expense report).  I have completed the providers page of her application and have emailed all to Dr Mardene Speak nurse so she can obtain his signature, date it, and to fax all to [employer] at the fax number written on the cover letter included.

## 2022-07-11 NOTE — Telephone Encounter (Signed)
Printed, signed, and faxed.

## 2022-07-19 NOTE — Telephone Encounter (Signed)
**Note De-Identified Trig Mcbryar Obfuscation** Letter received from [facility] Taraneh Metheney fax stating that they have approved the pt fpr Eliquis assistanc until 11/05/2022. [id number]  The letter states that they have notified the pt of this approval as well.

## 2022-07-21 ENCOUNTER — Other Ambulatory Visit: Payer: Self-pay | Admitting: Internal Medicine

## 2022-07-21 DIAGNOSIS — I480 Paroxysmal atrial fibrillation: Secondary | ICD-10-CM

## 2022-07-21 NOTE — Telephone Encounter (Signed)
Prescription refill request for Eliquis received. Indication: Afib  Last office visit: 04/25/22 Quentin Ore)  Scr: 0.95 (04/10/22 via [place]) Age: 71 Weight: 75.3kg  Appropriate dose and refill sent to requested pharmacy.

## 2022-07-23 NOTE — Progress Notes (Signed)
Carelink Summary Report / Loop Recorder 

## 2022-07-27 DIAGNOSIS — S83242D Other tear of medial meniscus, current injury, left knee, subsequent encounter: Secondary | ICD-10-CM | POA: Diagnosis not present

## 2022-07-31 ENCOUNTER — Ambulatory Visit (INDEPENDENT_AMBULATORY_CARE_PROVIDER_SITE_OTHER): Payer: Medicare HMO

## 2022-07-31 DIAGNOSIS — I480 Paroxysmal atrial fibrillation: Secondary | ICD-10-CM | POA: Diagnosis not present

## 2022-08-01 DIAGNOSIS — G4733 Obstructive sleep apnea (adult) (pediatric): Secondary | ICD-10-CM | POA: Diagnosis not present

## 2022-08-01 LAB — CUP PACEART REMOTE DEVICE CHECK
Date Time Interrogation Session: 20230922230144
Implantable Pulse Generator Implant Date: 20220613

## 2022-08-08 DIAGNOSIS — K591 Functional diarrhea: Secondary | ICD-10-CM | POA: Diagnosis not present

## 2022-08-08 DIAGNOSIS — K648 Other hemorrhoids: Secondary | ICD-10-CM | POA: Diagnosis not present

## 2022-08-14 NOTE — Progress Notes (Signed)
Carelink Summary Report / Loop Recorder 

## 2022-08-24 DIAGNOSIS — S83242S Other tear of medial meniscus, current injury, left knee, sequela: Secondary | ICD-10-CM | POA: Diagnosis not present

## 2022-08-24 DIAGNOSIS — M25562 Pain in left knee: Secondary | ICD-10-CM | POA: Diagnosis not present

## 2022-08-25 ENCOUNTER — Telehealth: Payer: Self-pay | Admitting: *Deleted

## 2022-08-25 DIAGNOSIS — I10 Essential (primary) hypertension: Secondary | ICD-10-CM

## 2022-08-25 DIAGNOSIS — I6523 Occlusion and stenosis of bilateral carotid arteries: Secondary | ICD-10-CM

## 2022-08-25 DIAGNOSIS — B1920 Unspecified viral hepatitis C without hepatic coma: Secondary | ICD-10-CM

## 2022-08-25 DIAGNOSIS — G4733 Obstructive sleep apnea (adult) (pediatric): Secondary | ICD-10-CM

## 2022-08-25 DIAGNOSIS — Z8619 Personal history of other infectious and parasitic diseases: Secondary | ICD-10-CM

## 2022-08-25 DIAGNOSIS — E785 Hyperlipidemia, unspecified: Secondary | ICD-10-CM

## 2022-08-25 DIAGNOSIS — Z720 Tobacco use: Secondary | ICD-10-CM

## 2022-08-25 DIAGNOSIS — R55 Syncope and collapse: Secondary | ICD-10-CM

## 2022-08-25 NOTE — Telephone Encounter (Signed)
   Pre-operative Risk Assessment    Patient Name: Theresa Reynolds  DOB: 22-Mar-1951 MRN: [id number]      Request for Surgical Clearance    Procedure:   LEFT KNEE SCOPE PMM  Date of Surgery:  Clearance TBD                                 Surgeon:  DR. Victorino December Surgeon's Group or Practice Name:  [facility] Phone number:  [phone] ATTN: [place] Fax number:  [phone]   Type of Clearance Requested:   - Medical  - Pharmacy:  Hold Apixaban (Eliquis)     Type of Anesthesia:   CHOICE   Additional requests/questions:    Jiles Prows   08/25/2022, 2:05 PM

## 2022-08-29 ENCOUNTER — Telehealth: Payer: Self-pay

## 2022-08-29 NOTE — Telephone Encounter (Signed)
  Patient Consent for Virtual Visit         Theresa Reynolds has provided verbal consent on 08/29/2022 for a virtual visit (video or telephone).   CONSENT FOR VIRTUAL VISIT FOR:  Theresa Reynolds  By participating in this virtual visit I agree to the following:  I hereby voluntarily request, consent and authorize [facility] and its employed or contracted physicians, physician assistants, nurse practitioners or other licensed health care professionals (the Practitioner), to provide me with telemedicine health care services (the "Services") as deemed necessary by the treating Practitioner. I acknowledge and consent to receive the Services by the Practitioner via telemedicine. I understand that the telemedicine visit will involve communicating with the Practitioner through live audiovisual communication technology and the disclosure of certain medical information by electronic transmission. I acknowledge that I have been given the opportunity to request an in-person assessment or other available alternative prior to the telemedicine visit and am voluntarily participating in the telemedicine visit.  I understand that I have the right to withhold or withdraw my consent to the use of telemedicine in the course of my care at any time, without affecting my right to future care or treatment, and that the Practitioner or I may terminate the telemedicine visit at any time. I understand that I have the right to inspect all information obtained and/or recorded in the course of the telemedicine visit and may receive copies of available information for a reasonable fee.  I understand that some of the potential risks of receiving the Services via telemedicine include:  Delay or interruption in medical evaluation due to technological equipment failure or disruption; Information transmitted may not be sufficient (e.g. poor resolution of images) to allow for appropriate medical decision making by the  Practitioner; and/or  In rare instances, security protocols could fail, causing a breach of personal health information.  Furthermore, I acknowledge that it is my responsibility to provide information about my medical history, conditions and care that is complete and accurate to the best of my ability. I acknowledge that Practitioner's advice, recommendations, and/or decision may be based on factors not within their control, such as incomplete or inaccurate data provided by me or distortions of diagnostic images or specimens that may result from electronic transmissions. I understand that the practice of medicine is not an exact science and that Practitioner makes no warranties or guarantees regarding treatment outcomes. I acknowledge that a copy of this consent can be made available to me via my patient portal ([facility]), or I can request a printed copy by calling the office of [facility].    I understand that my insurance will be billed for this visit.   I have read or had this consent read to me. I understand the contents of this consent, which adequately explains the benefits and risks of the Services being provided via telemedicine.  I have been provided ample opportunity to ask questions regarding this consent and the Services and have had my questions answered to my satisfaction. I give my informed consent for the services to be provided through the use of telemedicine in my medical care

## 2022-08-29 NOTE — Telephone Encounter (Signed)
Patient called to get update on clearance. Please advise.

## 2022-08-29 NOTE — Telephone Encounter (Addendum)
Patient aware that we are waiting for pharmacy recommendations. Patient is scheduled for a telephone visit on 09/05/22. Med rec and consent done

## 2022-08-30 DIAGNOSIS — I6523 Occlusion and stenosis of bilateral carotid arteries: Secondary | ICD-10-CM | POA: Insufficient documentation

## 2022-08-30 DIAGNOSIS — I10 Essential (primary) hypertension: Secondary | ICD-10-CM | POA: Insufficient documentation

## 2022-08-30 DIAGNOSIS — B1920 Unspecified viral hepatitis C without hepatic coma: Secondary | ICD-10-CM | POA: Insufficient documentation

## 2022-08-30 DIAGNOSIS — R55 Syncope and collapse: Secondary | ICD-10-CM | POA: Insufficient documentation

## 2022-08-30 DIAGNOSIS — G4733 Obstructive sleep apnea (adult) (pediatric): Secondary | ICD-10-CM | POA: Insufficient documentation

## 2022-08-30 DIAGNOSIS — E785 Hyperlipidemia, unspecified: Secondary | ICD-10-CM | POA: Insufficient documentation

## 2022-08-30 DIAGNOSIS — Z720 Tobacco use: Secondary | ICD-10-CM | POA: Insufficient documentation

## 2022-08-30 DIAGNOSIS — Z8619 Personal history of other infectious and parasitic diseases: Secondary | ICD-10-CM | POA: Insufficient documentation

## 2022-08-30 NOTE — Telephone Encounter (Signed)
Patient wants to know if it's necessary to have the Telephone visit when she has loop recorder and we get all of her heart information, or if can be moved up to this Friday.

## 2022-08-30 NOTE — Telephone Encounter (Signed)
Per pharmacy team may hold Eliquis 3 days prior to planned procedure. Medical clearance to be addressed at upcoming phone visit 09/05/22. Will remove from preop pool.   Loel Dubonnet, NP

## 2022-08-30 NOTE — Telephone Encounter (Signed)
Patient with diagnosis of PAF on Eliquis for anticoagulation.    Procedure: LEFT KNEE SCOPE PMM   Date of procedure: TBD   CHA2DS2-VASc Score = 4  This indicates a 4.8% annual risk of stroke. The patient's score is based upon: CHF History: 0 HTN History: 1 Diabetes History: 0 Stroke History: 0 Vascular Disease History: 1 Age Score: 1 Gender Score: 1   Updated patient's problem list per Dr Mardene Speak last office note  CrCl 66 mL/min Platelet count overdue   Per office protocol, patient can hold Eliquis for 3 days prior to procedure.    **This guidance is not considered finalized until pre-operative APP has relayed final recommendations.**

## 2022-08-31 DIAGNOSIS — G4733 Obstructive sleep apnea (adult) (pediatric): Secondary | ICD-10-CM | POA: Diagnosis not present

## 2022-08-31 NOTE — Telephone Encounter (Signed)
S/w the pt, all questions were answered.Pt would like to move her tele appt up to Friday 09/01/22, so that she can try and get her surgery scheduled to be done before Thanksgiving. 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

## 2022-09-01 ENCOUNTER — Encounter (HOSPITAL_BASED_OUTPATIENT_CLINIC_OR_DEPARTMENT_OTHER): Payer: Self-pay | Admitting: Family

## 2022-09-01 ENCOUNTER — Ambulatory Visit (INDEPENDENT_AMBULATORY_CARE_PROVIDER_SITE_OTHER): Payer: Medicare HMO | Admitting: Family

## 2022-09-01 DIAGNOSIS — Z01818 Encounter for other preprocedural examination: Secondary | ICD-10-CM

## 2022-09-01 LAB — CUP PACEART REMOTE DEVICE CHECK
Date Time Interrogation Session: 20231025231130
Implantable Pulse Generator Implant Date: 20220613

## 2022-09-01 NOTE — Progress Notes (Signed)
Virtual Visit via Telephone Note   Because of Theresa Reynolds's co-morbid illnesses, she is at least at moderate risk for complications without adequate follow up.  This format is felt to be most appropriate for this patient at this time.  The patient did not have access to video technology/had technical difficulties with video requiring transitioning to audio format only (telephone).  All issues noted in this document were discussed and addressed.  No physical exam could be performed with this format.  Please refer to the patient's chart for her consent to telehealth for [facility].   Date:  09/01/2022   ID:  Theresa Reynolds, DOB [birth year], MRN [id number] The patient was identified using 2 identifiers.  Patient Location: Home Provider Location: Home Office  PCP:  Kristen Loader, [place] Providers Cardiologist:  None Electrophysiologist:  Vickie Epley, MD     Evaluation Performed:  Follow-Up Visit  Chief Complaint:  Preop clearance  History of Present Illness:    Theresa Reynolds is a 71 y.o. female with AI, atrial fibrillation, bilateral carotid stenosis, HTN, HLD, hepatitis C, hypersomnia, nonobstructive CAD, OSA on CPAP, syncope s/p ILR, tobacco use, shingles. Last seen 04/25/22 by Dr. Quentin Ore. She was doing overall well from cardiac perspective, Metoprolol increased to '50mg'$  to suppress any further atrial fibrillation episodes, recommended to follow up in six months.   Presents today for preoperative clearance for left knee scope. Per pharmacy team, may hold Eliquis 3 days prior to planned procedure. Reports no shortness of breath nor dyspnea on exertion. Reports no chest pain, pressure, or tightness. No edema, orthopnea, PND. Reports no palpitations.    Past Medical History:  Diagnosis Date   Anxiety and depression    s/p suicide attempt 11/11   Aortic insufficiency    mild by echo 10/17   Atrial fibrillation (HCC)    Carotid stenosis,  bilateral    0-39% by [place]   Colitis    Hepatitis C    Followed by Dr. Dallas Breeding at [facility].  s/p treatment with Bavoni.   History of shingles    HTN (hypertension)    Hypercholesteremia    Hypersomnia    OSA (obstructive sleep apnea)    Right shoulder pain    Seasonal allergies    Syncope and collapse    Tobacco use    Vitamin D deficiency    Past Surgical History:  Procedure Laterality Date   ATRIAL FIBRILLATION ABLATION N/A 06/05/2017   Procedure: Atrial Fibrillation Ablation;  Surgeon: Thompson Grayer, MD;  Location: [place] [facility];  Service: Cardiovascular;  Laterality: N/A;   BREAST REDUCTION SURGERY  10/07/1999   With Dr. Towanda Malkin   CESAREAN SECTION     LOOP RECORDER INSERTION N/A 05/07/2017   Procedure: Loop Recorder Insertion;  Surgeon: Thompson Grayer, MD;  Location: [place] [facility];  Service: Cardiovascular;  Laterality: N/A;   LOOP RECORDER INSERTION  04/18/2021   removal of previously implanted [id number] with subsequent placement of a Medtronic Reveal Linq model LNQ 2 implantable loop recorder  (SN# [id number])     Current Meds  Medication Sig   cetirizine (ZYRTEC) 10 MG tablet Take 10 mg by mouth daily as needed for allergies.    ELIQUIS 5 MG TABS tablet TAKE ONE TABLET BY MOUTH TWICE DAILY   fluticasone (FLONASE) 50 MCG/ACT nasal spray Place 2 sprays into both nostrils daily as needed for allergies or rhinitis.   LORazepam (ATIVAN) 1  MG tablet Take 0.5-1 mg by mouth daily as needed for anxiety.    metoprolol succinate (TOPROL-XL) 50 MG 24 hr tablet Take 1 tablet (50 mg total) by mouth daily. Take with or immediately following a meal.   Multiple Vitamins-Minerals (EMERGEN-C IMMUNE) PACK Take 1 packet by mouth daily as needed (immune support).    sertraline (ZOLOFT) 50 MG tablet Take 50 mg by mouth daily.   simvastatin (ZOCOR) 20 MG tablet Take 20 mg by mouth at bedtime.    Tetrahydrozoline HCl (VISINE OP) Apply 1 drop to eye daily as needed (allergies).    traMADol (ULTRAM) 50 MG tablet Take 50 mg by mouth as needed.   traZODone (DESYREL) 50 MG tablet Take 50 mg by mouth at bedtime.     Allergies:   Cymbalta [duloxetine hcl]   Social History   Tobacco Use   Smoking status: Every Day    Packs/day: 1.00    Years: 30.00    Total pack years: 30.00    Types: Cigarettes   Smokeless tobacco: Never  Substance Use Topics   Alcohol use: Yes   Drug use: No     Family Hx: The patient's family history includes Atrial fibrillation in her brother and mother; Heart disease in her maternal grandfather; Mitral valve prolapse in her mother.  ROS:   Please see the history of present illness.     All other systems reviewed and are negative.   Prior CV studies:   The following studies were reviewed today:  CT cardiac morph 06/04/17 IMPRESSION: 1.  No LAA thrombus   2.  Mild bi atrial enlargement   3.  Calcium score 348 which is 94th percentile for age and sex   40.  No ASD/VSD or pericardial effusion   5.  Esophagus courses closest to the RLPV ostium   6. Normal pulmonary vein anatomy see body of report for measurements  Labs/Other Tests and Data Reviewed:    EKG:  No ECG reviewed.  Recent Labs: No results found for requested labs within last 365 days.   Recent Lipid Panel No results found for: "CHOL", "TRIG", "HDL", "CHOLHDL", "LDLCALC", "LDLDIRECT"  Wt Readings from Last 3 Encounters:  04/25/22 166 lb (75.3 kg)  04/18/21 164 lb 3.2 oz (74.5 kg)  04/12/20 165 lb (74.8 kg)     Risk Assessment/Calculations:    CHA2DS2-VASc Score = 4   This indicates a 4.8% annual risk of stroke. The patient's score is based upon: CHF History: 0 HTN History: 1 Diabetes History: 0 Stroke History: 0 Vascular Disease History: 1 Age Score: 1 Gender Score: 1         Objective:    Vital Signs:  NO vital signs available for review.   ASSESSMENT & PLAN:    Preoperative clearance - Able to achieve >4 METS. Per AHA/ACC guidelines, she  is deemed acceptable risk for the planned procedure without additional cardiovascular testing. Will route to surgical team so they are aware.   Anticoagulant: Per pharmacy team may hold Eliquis 3 days prior to planned procedure.    Time:   Today, I have spent 4 minutes with the patient with telehealth technology discussing the above problems.     Medication Adjustments/Labs and Tests Ordered: Current medicines are reviewed at length with the patient today.  Concerns regarding medicines are outlined above.   Tests Ordered: No orders of the defined types were placed in this encounter.   Medication Changes: No orders of the defined types were placed in  this encounter.   Follow Up:  In Person  in December 2024 with EP APP  Signed, Loel Dubonnet, NP  09/01/2022 2:46 PM    [place]

## 2022-09-01 NOTE — Patient Instructions (Signed)
Medication Instructions:  You may hold Eliquis 3 days prior to planned procedure.   *If you need a refill on your cardiac medications before your next appointment, please call your pharmacy*   Lab Work/Testing/Procedures: None ordered today   Follow-Up: At [facility], you and your health needs are our priority.  As part of our continuing mission to provide you with exceptional heart care, we have created designated Provider Care Teams.  These Care Teams include your primary Cardiologist (physician) and Advanced Practice Providers (APPs -  Physician Assistants and Nurse Practitioners) who all work together to provide you with the care you need, when you need it.  We recommend signing up for the patient portal called "MyChart".  Sign up information is provided on this After Visit Summary.  MyChart is used to connect with patients for Virtual Visits (Telemedicine).  Patients are able to view lab/test results, encounter notes, upcoming appointments, etc.  Non-urgent messages can be sent to your provider as well.   To learn more about what you can do with MyChart, go to [email or web address].    Your next appointment:   10/2023 - we will reach out to you to schedule  Other Instructions  We have sent clearance to your surgeon's office for your procedure.   Heart Healthy Diet Recommendations: A low-salt diet is recommended. Meats should be grilled, baked, or boiled. Avoid fried foods. Focus on lean protein sources like fish or chicken with vegetables and fruits. The American Heart Association is a Microbiologist!  American Heart Association Diet and Lifeystyle Recommendations   Exercise recommendations: The American Heart Association recommends 150 minutes of moderate intensity exercise weekly. Try 30 minutes of moderate intensity exercise 4-5 times per week. This could include walking, jogging, or swimming.   Important Information About Sugar

## 2022-09-04 ENCOUNTER — Ambulatory Visit (INDEPENDENT_AMBULATORY_CARE_PROVIDER_SITE_OTHER): Payer: Medicare HMO

## 2022-09-04 DIAGNOSIS — R55 Syncope and collapse: Secondary | ICD-10-CM

## 2022-09-05 ENCOUNTER — Telehealth: Payer: Medicare HMO

## 2022-09-21 DIAGNOSIS — G4733 Obstructive sleep apnea (adult) (pediatric): Secondary | ICD-10-CM | POA: Diagnosis not present

## 2022-09-22 DIAGNOSIS — J309 Allergic rhinitis, unspecified: Secondary | ICD-10-CM | POA: Diagnosis not present

## 2022-09-22 DIAGNOSIS — I4891 Unspecified atrial fibrillation: Secondary | ICD-10-CM | POA: Diagnosis not present

## 2022-09-22 DIAGNOSIS — G4733 Obstructive sleep apnea (adult) (pediatric): Secondary | ICD-10-CM | POA: Diagnosis not present

## 2022-09-22 DIAGNOSIS — Z6830 Body mass index (BMI) 30.0-30.9, adult: Secondary | ICD-10-CM | POA: Diagnosis not present

## 2022-09-22 DIAGNOSIS — I272 Pulmonary hypertension, unspecified: Secondary | ICD-10-CM | POA: Diagnosis not present

## 2022-10-01 DIAGNOSIS — G4733 Obstructive sleep apnea (adult) (pediatric): Secondary | ICD-10-CM | POA: Diagnosis not present

## 2022-10-02 DIAGNOSIS — S83222A Peripheral tear of medial meniscus, current injury, left knee, initial encounter: Secondary | ICD-10-CM | POA: Diagnosis not present

## 2022-10-02 DIAGNOSIS — M659 Synovitis and tenosynovitis, unspecified: Secondary | ICD-10-CM | POA: Diagnosis not present

## 2022-10-02 DIAGNOSIS — Y999 Unspecified external cause status: Secondary | ICD-10-CM | POA: Diagnosis not present

## 2022-10-02 DIAGNOSIS — M94262 Chondromalacia, left knee: Secondary | ICD-10-CM | POA: Diagnosis not present

## 2022-10-02 DIAGNOSIS — M948X6 Other specified disorders of cartilage, lower leg: Secondary | ICD-10-CM | POA: Diagnosis not present

## 2022-10-02 DIAGNOSIS — G8918 Other acute postprocedural pain: Secondary | ICD-10-CM | POA: Diagnosis not present

## 2022-10-02 DIAGNOSIS — X58XXXA Exposure to other specified factors, initial encounter: Secondary | ICD-10-CM | POA: Diagnosis not present

## 2022-10-02 DIAGNOSIS — M2242 Chondromalacia patellae, left knee: Secondary | ICD-10-CM | POA: Diagnosis not present

## 2022-10-07 NOTE — Progress Notes (Signed)
Carelink Summary Report / Loop Recorder 

## 2022-10-09 ENCOUNTER — Ambulatory Visit (INDEPENDENT_AMBULATORY_CARE_PROVIDER_SITE_OTHER): Payer: Medicare HMO

## 2022-10-09 DIAGNOSIS — R55 Syncope and collapse: Secondary | ICD-10-CM | POA: Diagnosis not present

## 2022-10-09 DIAGNOSIS — M25562 Pain in left knee: Secondary | ICD-10-CM | POA: Diagnosis not present

## 2022-10-09 LAB — CUP PACEART REMOTE DEVICE CHECK
Date Time Interrogation Session: 20231203230932
Implantable Pulse Generator Implant Date: 20220613

## 2022-10-16 DIAGNOSIS — M25562 Pain in left knee: Secondary | ICD-10-CM | POA: Diagnosis not present

## 2022-10-16 DIAGNOSIS — I4891 Unspecified atrial fibrillation: Secondary | ICD-10-CM | POA: Diagnosis not present

## 2022-10-16 DIAGNOSIS — I351 Nonrheumatic aortic (valve) insufficiency: Secondary | ICD-10-CM | POA: Diagnosis not present

## 2022-10-16 DIAGNOSIS — I739 Peripheral vascular disease, unspecified: Secondary | ICD-10-CM | POA: Diagnosis not present

## 2022-10-16 DIAGNOSIS — I6523 Occlusion and stenosis of bilateral carotid arteries: Secondary | ICD-10-CM | POA: Diagnosis not present

## 2022-10-16 DIAGNOSIS — I272 Pulmonary hypertension, unspecified: Secondary | ICD-10-CM | POA: Diagnosis not present

## 2022-10-19 NOTE — Progress Notes (Signed)
Electrophysiology Office Note Date: 10/23/2022  ID:  Theresa Reynolds, DOB 09-Apr-1951, MRN [id number]  PCP: Kristen Loader, FNP Primary Cardiologist: None Electrophysiologist: Vickie Epley, MD   CC: ILR follow-up  Theresa Reynolds is a 71 y.o. female seen today for Dr. Quentin Ore . she presents today for routine electrophysiology followup. Since last being seen in our clinic the patient reports doing very well.  she denies chest pain, palpitations, dyspnea, PND, orthopnea, nausea, vomiting, dizziness, syncope, edema, weight gain, or early satiety. .  Device History: Medtronic [id number] recorder implanted 04/2021 for  atrial fibrillation and dizziness.  After her DHRC1 device had met ERI.  Past Medical History:  Diagnosis Date   Anxiety and depression    s/p suicide attempt 11/11   Aortic insufficiency    mild by echo 10/17   Atrial fibrillation (HCC)    Carotid stenosis, bilateral    0-39% by [place]   Colitis    Hepatitis C    Followed by Dr. Dallas Breeding at [facility]  s/p treatment with Bavoni.   History of shingles    HTN (hypertension)    Hypercholesteremia    Hypersomnia    OSA (obstructive sleep apnea)    Right shoulder pain    Seasonal allergies    Syncope and collapse    Tobacco use    Vitamin D deficiency    Past Surgical History:  Procedure Laterality Date   ATRIAL FIBRILLATION ABLATION N/A 06/05/2017   Procedure: Atrial Fibrillation Ablation;  Surgeon: Thompson Grayer, MD;  Location: [place] [facility];  Service: Cardiovascular;  Laterality: N/A;   BREAST REDUCTION SURGERY  10/07/1999   With Dr. Towanda Malkin   CESAREAN SECTION     LOOP RECORDER INSERTION N/A 05/07/2017   Procedure: Loop Recorder Insertion;  Surgeon: Thompson Grayer, MD;  Location: [place] [facility];  Service: Cardiovascular;  Laterality: N/A;   LOOP RECORDER INSERTION  04/18/2021   removal of previously implanted ULAG5 with subsequent placement of a Medtronic Reveal Linq model LNQ 2 implantable  loop recorder  (SN# [id number])    Current Outpatient Medications  Medication Sig Dispense Refill   cetirizine (ZYRTEC) 10 MG tablet Take 10 mg by mouth daily as needed for allergies.      ELIQUIS 5 MG TABS tablet TAKE ONE TABLET BY MOUTH TWICE DAILY 180 tablet 1   fluticasone (FLONASE) 50 MCG/ACT nasal spray Place 2 sprays into both nostrils daily as needed for allergies or rhinitis.     LORazepam (ATIVAN) 1 MG tablet Take 0.5-1 mg by mouth daily as needed for anxiety.      methylPREDNISolone (MEDROL DOSEPAK) 4 MG TBPK tablet Take by mouth as directed.     metoprolol succinate (TOPROL-XL) 50 MG 24 hr tablet Take 1 tablet (50 mg total) by mouth daily. Take with or immediately following a meal. 90 tablet 3   Multiple Vitamins-Minerals (EMERGEN-C IMMUNE) PACK Take 1 packet by mouth daily as needed (immune support).      oxyCODONE-acetaminophen (PERCOCET/ROXICET) 5-325 MG tablet Take 1 tablet every 8 hours by oral route as needed.     sertraline (ZOLOFT) 50 MG tablet Take 50 mg by mouth daily.  0   simvastatin (ZOCOR) 20 MG tablet Take 20 mg by mouth at bedtime.      Tetrahydrozoline HCl (VISINE OP) Apply 1 drop to eye daily as needed (allergies).     traMADol (ULTRAM) 50 MG tablet Take 50 mg by mouth as needed.  traZODone (DESYREL) 50 MG tablet Take 50 mg by mouth at bedtime.     No current facility-administered medications for this visit.    Allergies:   Cymbalta [duloxetine hcl]   Social History: Social History   Socioeconomic History   Marital status: Divorced    Spouse name: Not on file   Number of children: Not on file   Years of education: Not on file   Highest education level: Not on file  Occupational History   Not on file  Tobacco Use   Smoking status: Every Day    Packs/day: 1.00    Years: 30.00    Total pack years: 30.00    Types: Cigarettes   Smokeless tobacco: Never  Substance and Sexual Activity   Alcohol use: Yes   Drug use: No   Sexual activity: Not on  file  Other Topics Concern   Not on file  Social History Narrative   Not on file   Social Determinants of Health   Financial Resource Strain: Not on file  Food Insecurity: Not on file  Transportation Needs: Not on file  Physical Activity: Not on file  Stress: Not on file  Social Connections: Not on file  Intimate Partner Violence: Not on file    Family History: Family History  Problem Relation Age of Onset   Atrial fibrillation Mother    Mitral valve prolapse Mother    Atrial fibrillation Brother    Heart disease Maternal Grandfather      Review of Systems: All other systems reviewed and are otherwise negative except as noted above.  Physical Exam: Vitals:   10/23/22 1015  BP: 118/62  Pulse: 61  SpO2: 94%  Weight: 157 lb 9.6 oz (71.5 kg)  Height: 5' 1" (1.549 m)     GEN- The patient is well appearing, alert and oriented x 3 today.   HEENT: normocephalic, atraumatic; sclera clear, conjunctiva pink; hearing intact; oropharynx clear; neck supple  Lungs- Clear to ausculation bilaterally, normal work of breathing.  No wheezes, rales, rhonchi Heart- Regular rate and rhythm, no murmurs, rubs or gallops  GI- soft, non-tender, non-distended, bowel sounds present  Extremities- no clubbing, cyanosis, or edema  MS- no significant deformity or atrophy Skin- warm and dry, no rash or lesion; ILR pocket well healed Psych- euthymic mood, full affect Neuro- strength and sensation are intact  PPM Interrogation- reviewed in detail today,  See PACEART report  EKG:  EKG is ordered today. Shows NSR at 61 bpm, personal review  Recent Labs: No results found for requested labs within last 365 days.   Wt Readings from Last 3 Encounters:  10/23/22 157 lb 9.6 oz (71.5 kg)  04/25/22 166 lb (75.3 kg)  04/18/21 164 lb 3.2 oz (74.5 kg)     Other studies Reviewed: Additional studies/ records that were reviewed today include: Previous EP office notes, Previous remote checks, Most recent  labwork.   Assessment and Plan:  1. Atrial fibrillation s/p Medtronic Loop recorder S/p ablation 05/2017 Normal device function See Pace Art report No changes today  2. HTN Stable on current regimen   3. Aortic regurg Reports echo at [facility] showed at least mild AI.    Current medicines are reviewed at length with the patient today.   The patient does not have concerns regarding her medicines.  The following changes were made today:  None  Labs/ tests ordered today include:  No orders of the defined types were placed in this encounter.    Disposition:  Follow up with Dr. Quentin Ore  in 6 Months    Signed, Annamaria Helling  10/23/2022 10:21 AM  [place] [address] [place] [address] [phone] (office) [phone] (fax)

## 2022-10-20 DIAGNOSIS — D6869 Other thrombophilia: Secondary | ICD-10-CM | POA: Diagnosis not present

## 2022-10-20 DIAGNOSIS — M25562 Pain in left knee: Secondary | ICD-10-CM | POA: Diagnosis not present

## 2022-10-20 DIAGNOSIS — R7303 Prediabetes: Secondary | ICD-10-CM | POA: Diagnosis not present

## 2022-10-20 DIAGNOSIS — E782 Mixed hyperlipidemia: Secondary | ICD-10-CM | POA: Diagnosis not present

## 2022-10-20 DIAGNOSIS — I480 Paroxysmal atrial fibrillation: Secondary | ICD-10-CM | POA: Diagnosis not present

## 2022-10-20 DIAGNOSIS — R69 Illness, unspecified: Secondary | ICD-10-CM | POA: Diagnosis not present

## 2022-10-20 DIAGNOSIS — Z23 Encounter for immunization: Secondary | ICD-10-CM | POA: Diagnosis not present

## 2022-10-20 DIAGNOSIS — I10 Essential (primary) hypertension: Secondary | ICD-10-CM | POA: Diagnosis not present

## 2022-10-23 ENCOUNTER — Ambulatory Visit: Payer: Medicare HMO | Attending: Student | Admitting: Student

## 2022-10-23 ENCOUNTER — Encounter: Payer: Self-pay | Admitting: Student

## 2022-10-23 VITALS — BP 118/62 | HR 61 | Ht 61.0 in | Wt 157.6 lb

## 2022-10-23 DIAGNOSIS — I10 Essential (primary) hypertension: Secondary | ICD-10-CM

## 2022-10-23 DIAGNOSIS — I480 Paroxysmal atrial fibrillation: Secondary | ICD-10-CM

## 2022-10-23 DIAGNOSIS — M25562 Pain in left knee: Secondary | ICD-10-CM | POA: Diagnosis not present

## 2022-10-23 LAB — CBC
Hematocrit: 45.5 % (ref 34.0–46.6)
Hemoglobin: 15 g/dL (ref 11.1–15.9)
MCH: 33.7 pg — ABNORMAL HIGH (ref 26.6–33.0)
MCHC: 33 g/dL (ref 31.5–35.7)
MCV: 102 fL — ABNORMAL HIGH (ref 79–97)
Platelets: 372 10*3/uL (ref 150–450)
RBC: 4.45 x10E6/uL (ref 3.77–5.28)
RDW: 11.5 % — ABNORMAL LOW (ref 11.7–15.4)
WBC: 11.3 10*3/uL — ABNORMAL HIGH (ref 3.4–10.8)

## 2022-10-23 LAB — BASIC METABOLIC PANEL
BUN/Creatinine Ratio: 18 (ref 12–28)
BUN: 16 mg/dL (ref 8–27)
CO2: 21 mmol/L (ref 20–29)
Calcium: 9.7 mg/dL (ref 8.7–10.3)
Chloride: 98 mmol/L (ref 96–106)
Creatinine, Ser: 0.89 mg/dL (ref 0.57–1.00)
Glucose: 95 mg/dL (ref 70–99)
Potassium: 4.5 mmol/L (ref 3.5–5.2)
Sodium: 134 mmol/L (ref 134–144)
eGFR: 70 mL/min/{1.73_m2} (ref >59)

## 2022-10-23 NOTE — Patient Instructions (Signed)
Medication Instructions:  Your physician recommends that you continue on your current medications as directed. Please refer to the Current Medication list given to you today.  *If you need a refill on your cardiac medications before your next appointment, please call your pharmacy*   Lab Work: TODAY: BMET, CBC  If you have labs (blood work) drawn today and your tests are completely normal, you will receive your results only by: [place] (if you have MyChart) OR A paper copy in the mail If you have any lab test that is abnormal or we need to change your treatment, we will call you to review the results.   Follow-Up: At [facility], you and your health needs are our priority.  As part of our continuing mission to provide you with exceptional heart care, we have created designated Provider Care Teams.  These Care Teams include your primary Cardiologist (physician) and Advanced Practice Providers (APPs -  Physician Assistants and Nurse Practitioners) who all work together to provide you with the care you need, when you need it.   Your next appointment:   6 month(s)  The format for your next appointment:   In Person  Provider:   Lars Mage, MD     Important Information About Sugar

## 2022-10-27 DIAGNOSIS — M25562 Pain in left knee: Secondary | ICD-10-CM | POA: Diagnosis not present

## 2022-10-31 DIAGNOSIS — M25562 Pain in left knee: Secondary | ICD-10-CM | POA: Diagnosis not present

## 2022-10-31 DIAGNOSIS — G4733 Obstructive sleep apnea (adult) (pediatric): Secondary | ICD-10-CM | POA: Diagnosis not present

## 2022-11-03 DIAGNOSIS — M25562 Pain in left knee: Secondary | ICD-10-CM | POA: Diagnosis not present

## 2022-11-07 DIAGNOSIS — M25562 Pain in left knee: Secondary | ICD-10-CM | POA: Diagnosis not present

## 2022-11-08 DIAGNOSIS — Z4789 Encounter for other orthopedic aftercare: Secondary | ICD-10-CM | POA: Diagnosis not present

## 2022-11-10 DIAGNOSIS — M25562 Pain in left knee: Secondary | ICD-10-CM | POA: Diagnosis not present

## 2022-11-13 ENCOUNTER — Ambulatory Visit (INDEPENDENT_AMBULATORY_CARE_PROVIDER_SITE_OTHER): Payer: Medicare HMO

## 2022-11-13 DIAGNOSIS — I480 Paroxysmal atrial fibrillation: Secondary | ICD-10-CM

## 2022-11-14 LAB — CUP PACEART REMOTE DEVICE CHECK
Date Time Interrogation Session: 20240107231419
Implantable Pulse Generator Implant Date: 20220613

## 2022-11-16 NOTE — Progress Notes (Signed)
Carelink Summary Report / Loop Recorder 

## 2022-12-01 DIAGNOSIS — G4733 Obstructive sleep apnea (adult) (pediatric): Secondary | ICD-10-CM | POA: Diagnosis not present

## 2022-12-15 DIAGNOSIS — I272 Pulmonary hypertension, unspecified: Secondary | ICD-10-CM | POA: Diagnosis not present

## 2022-12-15 DIAGNOSIS — G4733 Obstructive sleep apnea (adult) (pediatric): Secondary | ICD-10-CM | POA: Diagnosis not present

## 2022-12-15 DIAGNOSIS — I4891 Unspecified atrial fibrillation: Secondary | ICD-10-CM | POA: Diagnosis not present

## 2022-12-15 DIAGNOSIS — J309 Allergic rhinitis, unspecified: Secondary | ICD-10-CM | POA: Diagnosis not present

## 2022-12-15 DIAGNOSIS — Z6830 Body mass index (BMI) 30.0-30.9, adult: Secondary | ICD-10-CM | POA: Diagnosis not present

## 2022-12-17 LAB — CUP PACEART REMOTE DEVICE CHECK
Date Time Interrogation Session: 20240209231115
Implantable Pulse Generator Implant Date: 20220613

## 2022-12-18 ENCOUNTER — Ambulatory Visit: Payer: Medicare HMO

## 2022-12-18 DIAGNOSIS — I480 Paroxysmal atrial fibrillation: Secondary | ICD-10-CM

## 2022-12-18 NOTE — Progress Notes (Signed)
Carelink Summary Report / Loop Recorder 

## 2022-12-23 DIAGNOSIS — G4733 Obstructive sleep apnea (adult) (pediatric): Secondary | ICD-10-CM | POA: Diagnosis not present

## 2022-12-29 ENCOUNTER — Ambulatory Visit
Admission: RE | Admit: 2022-12-29 | Discharge: 2022-12-29 | Disposition: A | Discharge status: Home / Self Care | Payer: Medicare HMO | Source: Ambulatory Visit | Attending: Family Medicine | Admitting: Family Medicine

## 2022-12-29 DIAGNOSIS — Z1231 Encounter for screening mammogram for malignant neoplasm of breast: Secondary | ICD-10-CM | POA: Diagnosis not present

## 2022-12-29 DIAGNOSIS — Z780 Asymptomatic menopausal state: Secondary | ICD-10-CM | POA: Diagnosis not present

## 2022-12-29 DIAGNOSIS — Z13820 Encounter for screening for osteoporosis: Secondary | ICD-10-CM

## 2022-12-29 DIAGNOSIS — E2839 Other primary ovarian failure: Secondary | ICD-10-CM

## 2023-01-01 DIAGNOSIS — G4733 Obstructive sleep apnea (adult) (pediatric): Secondary | ICD-10-CM | POA: Diagnosis not present

## 2023-01-18 DIAGNOSIS — L280 Lichen simplex chronicus: Secondary | ICD-10-CM | POA: Diagnosis not present

## 2023-01-18 DIAGNOSIS — D235 Other benign neoplasm of skin of trunk: Secondary | ICD-10-CM | POA: Diagnosis not present

## 2023-01-18 DIAGNOSIS — L814 Other melanin hyperpigmentation: Secondary | ICD-10-CM | POA: Diagnosis not present

## 2023-01-18 DIAGNOSIS — L821 Other seborrheic keratosis: Secondary | ICD-10-CM | POA: Diagnosis not present

## 2023-01-18 DIAGNOSIS — D2371 Other benign neoplasm of skin of right lower limb, including hip: Secondary | ICD-10-CM | POA: Diagnosis not present

## 2023-01-22 ENCOUNTER — Ambulatory Visit (INDEPENDENT_AMBULATORY_CARE_PROVIDER_SITE_OTHER): Payer: Medicare HMO

## 2023-01-22 DIAGNOSIS — I480 Paroxysmal atrial fibrillation: Secondary | ICD-10-CM

## 2023-01-23 LAB — CUP PACEART REMOTE DEVICE CHECK
Date Time Interrogation Session: 20240317231339
Implantable Pulse Generator Implant Date: 20220613

## 2023-01-30 DIAGNOSIS — G4733 Obstructive sleep apnea (adult) (pediatric): Secondary | ICD-10-CM | POA: Diagnosis not present

## 2023-01-31 DIAGNOSIS — M1712 Unilateral primary osteoarthritis, left knee: Secondary | ICD-10-CM | POA: Diagnosis not present

## 2023-01-31 NOTE — Progress Notes (Signed)
Carelink Summary Report / Loop Recorder 

## 2023-02-15 DIAGNOSIS — M1712 Unilateral primary osteoarthritis, left knee: Secondary | ICD-10-CM | POA: Diagnosis not present

## 2023-02-22 DIAGNOSIS — M1712 Unilateral primary osteoarthritis, left knee: Secondary | ICD-10-CM | POA: Diagnosis not present

## 2023-02-26 ENCOUNTER — Ambulatory Visit (INDEPENDENT_AMBULATORY_CARE_PROVIDER_SITE_OTHER): Payer: Medicare HMO

## 2023-02-26 DIAGNOSIS — I480 Paroxysmal atrial fibrillation: Secondary | ICD-10-CM

## 2023-02-26 LAB — CUP PACEART REMOTE DEVICE CHECK
Date Time Interrogation Session: 20240419230540
Implantable Pulse Generator Implant Date: 20220613

## 2023-03-01 DIAGNOSIS — M1712 Unilateral primary osteoarthritis, left knee: Secondary | ICD-10-CM | POA: Diagnosis not present

## 2023-03-02 DIAGNOSIS — G4733 Obstructive sleep apnea (adult) (pediatric): Secondary | ICD-10-CM | POA: Diagnosis not present

## 2023-03-05 NOTE — Progress Notes (Signed)
Carelink Summary Report / Loop Recorder 

## 2023-03-16 DIAGNOSIS — H5203 Hypermetropia, bilateral: Secondary | ICD-10-CM | POA: Diagnosis not present

## 2023-03-16 DIAGNOSIS — Z0100 Encounter for examination of eyes and vision without abnormal findings: Secondary | ICD-10-CM | POA: Diagnosis not present

## 2023-03-26 DIAGNOSIS — G4733 Obstructive sleep apnea (adult) (pediatric): Secondary | ICD-10-CM | POA: Diagnosis not present

## 2023-03-29 LAB — CUP PACEART REMOTE DEVICE CHECK
Date Time Interrogation Session: 20240522230620
Implantable Pulse Generator Implant Date: 20220613

## 2023-04-03 ENCOUNTER — Ambulatory Visit (INDEPENDENT_AMBULATORY_CARE_PROVIDER_SITE_OTHER): Payer: Medicare HMO

## 2023-04-03 DIAGNOSIS — R55 Syncope and collapse: Secondary | ICD-10-CM

## 2023-04-03 NOTE — Progress Notes (Signed)
Carelink Summary Report / Loop Recorder 

## 2023-04-04 DIAGNOSIS — Z86010 Personal history of colonic polyps: Secondary | ICD-10-CM | POA: Diagnosis not present

## 2023-04-04 DIAGNOSIS — K625 Hemorrhage of anus and rectum: Secondary | ICD-10-CM | POA: Diagnosis not present

## 2023-04-04 DIAGNOSIS — Z79899 Other long term (current) drug therapy: Secondary | ICD-10-CM | POA: Diagnosis not present

## 2023-04-16 DIAGNOSIS — I739 Peripheral vascular disease, unspecified: Secondary | ICD-10-CM | POA: Diagnosis not present

## 2023-04-16 DIAGNOSIS — Z6830 Body mass index (BMI) 30.0-30.9, adult: Secondary | ICD-10-CM | POA: Diagnosis not present

## 2023-04-16 DIAGNOSIS — G4733 Obstructive sleep apnea (adult) (pediatric): Secondary | ICD-10-CM | POA: Diagnosis not present

## 2023-04-16 DIAGNOSIS — I4891 Unspecified atrial fibrillation: Secondary | ICD-10-CM | POA: Diagnosis not present

## 2023-04-16 DIAGNOSIS — J309 Allergic rhinitis, unspecified: Secondary | ICD-10-CM | POA: Diagnosis not present

## 2023-04-18 ENCOUNTER — Other Ambulatory Visit: Payer: Self-pay | Admitting: Cardiology

## 2023-04-20 DIAGNOSIS — K635 Polyp of colon: Secondary | ICD-10-CM | POA: Diagnosis not present

## 2023-04-20 DIAGNOSIS — Z1211 Encounter for screening for malignant neoplasm of colon: Secondary | ICD-10-CM | POA: Diagnosis not present

## 2023-04-20 DIAGNOSIS — K625 Hemorrhage of anus and rectum: Secondary | ICD-10-CM | POA: Diagnosis not present

## 2023-04-24 DIAGNOSIS — L298 Other pruritus: Secondary | ICD-10-CM | POA: Diagnosis not present

## 2023-04-24 DIAGNOSIS — L820 Inflamed seborrheic keratosis: Secondary | ICD-10-CM | POA: Diagnosis not present

## 2023-04-24 DIAGNOSIS — L810 Postinflammatory hyperpigmentation: Secondary | ICD-10-CM | POA: Diagnosis not present

## 2023-04-24 DIAGNOSIS — L280 Lichen simplex chronicus: Secondary | ICD-10-CM | POA: Diagnosis not present

## 2023-04-24 DIAGNOSIS — L538 Other specified erythematous conditions: Secondary | ICD-10-CM | POA: Diagnosis not present

## 2023-04-26 NOTE — Progress Notes (Deleted)
  Electrophysiology Office Follow up Visit Note:    Date:  04/26/2023   ID:  Theresa Reynolds, DOB 12-Mar-1951, MRN [id number]  PCP:  Soundra Pilon, FNP  [facility] Cardiologist:  None  [facility] Electrophysiologist:  Lanier Prude, MD    Interval History:    Theresa Reynolds is a 72 y.o. female who presents for a follow up visit.   Last seen April 25, 2022.  The patient was previously followed by Dr. Johney Frame.  At the last appointment she was taking Eliquis and metoprolol for her atrial fibrillation.  She saw Mardelle Matte October 23, 2022 and was doing well.  She presents today for routine follow-up.     Past medical, surgical, social and family history were reviewed.  ROS:   Please see the history of present illness.    All other systems reviewed and are negative.  EKGs/Labs/Other Studies Reviewed:    The following studies were reviewed today:  Loop recorder interrogations  Physical Exam:    VS:  There were no vitals taken for this visit.    Wt Readings from Last 3 Encounters:  10/23/22 157 lb 9.6 oz (71.5 kg)  04/25/22 166 lb (75.3 kg)  04/18/21 164 lb 3.2 oz (74.5 kg)     GEN: *** Well nourished, well developed in no acute distress CARDIAC: ***RRR, no murmurs, rubs, gallops RESPIRATORY:  Clear to auscultation without rales, wheezing or rhonchi       ASSESSMENT:    1. Paroxysmal atrial fibrillation (HCC)   2. Essential hypertension    PLAN:    In order of problems listed above:  #Paroxysmal atrial fibrillation Post ablation in July 2018 Low burden on loop recorder  #Hypertension *** goal today.  Recommend checking blood pressures 1-2 times per week at home and recording the values.  Recommend bringing these recordings to the primary care physician.  Follow-up 1 year with APP     Signed, Steffanie Dunn, MD, [facility], [facility] 04/26/2023 10:09 PM    Electrophysiology [facility]

## 2023-04-27 ENCOUNTER — Encounter: Payer: Self-pay | Admitting: Cardiology

## 2023-04-27 ENCOUNTER — Ambulatory Visit: Payer: Medicare HMO | Attending: Cardiology | Admitting: Cardiology

## 2023-04-27 DIAGNOSIS — F5101 Primary insomnia: Secondary | ICD-10-CM | POA: Diagnosis not present

## 2023-04-27 DIAGNOSIS — I10 Essential (primary) hypertension: Secondary | ICD-10-CM | POA: Diagnosis not present

## 2023-04-27 DIAGNOSIS — F3341 Major depressive disorder, recurrent, in partial remission: Secondary | ICD-10-CM | POA: Diagnosis not present

## 2023-04-27 DIAGNOSIS — I480 Paroxysmal atrial fibrillation: Secondary | ICD-10-CM | POA: Diagnosis not present

## 2023-04-27 DIAGNOSIS — R7303 Prediabetes: Secondary | ICD-10-CM | POA: Diagnosis not present

## 2023-04-27 DIAGNOSIS — E782 Mixed hyperlipidemia: Secondary | ICD-10-CM | POA: Diagnosis not present

## 2023-04-27 DIAGNOSIS — D6869 Other thrombophilia: Secondary | ICD-10-CM | POA: Diagnosis not present

## 2023-04-27 NOTE — Progress Notes (Signed)
Carelink Summary Report / Loop Recorder 

## 2023-04-30 ENCOUNTER — Other Ambulatory Visit: Payer: Self-pay | Admitting: Cardiology

## 2023-04-30 DIAGNOSIS — I480 Paroxysmal atrial fibrillation: Secondary | ICD-10-CM

## 2023-04-30 MED ORDER — APIXABAN 5 MG PO TABS: 5.0000 mg | ORAL_TABLET | Freq: Two times a day (BID) | ORAL | 1 refills | Status: DC

## 2023-04-30 MED ORDER — METOPROLOL SUCCINATE ER 50 MG PO TB24: ORAL_TABLET | ORAL | 1 refills | Status: DC

## 2023-04-30 NOTE — Telephone Encounter (Signed)
Pt's medication was sent to pt's pharmacy as requested. Confirmation received. Pt needing a refill on Eliquis. Please address

## 2023-04-30 NOTE — Telephone Encounter (Signed)
Eliquis 5mg  refill request received. Patient is 72 years old, weight-71.5kg, Crea-0.89 on 10/23/22, Diagnosis-Afib, and last seen by Otilio Saber on 10/23/22 & pending appt 06/13/23 with Otilio Saber. Dose is appropriate based on dosing criteria. Will send in refill to requested pharmacy.

## 2023-04-30 NOTE — Telephone Encounter (Signed)
*  STAT* If patient is at the pharmacy, call can be transferred to refill team.   1. Which medications need to be refilled? (please list name of each medication and dose if known) metoprolol succinate (TOPROL-XL) 50 MG 24 hr tablet   ELIQUIS 5 MG TABS tablet   2. Which pharmacy/location (including street and city if local pharmacy) is medication to be sent to? [facility] - [place], [place] - [place] [address]   3. Do they need a 30 day or 90 day supply? 90  Patient has appt on 8/7

## 2023-05-01 LAB — CUP PACEART REMOTE DEVICE CHECK
Date Time Interrogation Session: 20240624230935
Implantable Pulse Generator Implant Date: 20220613

## 2023-05-06 DIAGNOSIS — K635 Polyp of colon: Secondary | ICD-10-CM | POA: Diagnosis not present

## 2023-05-07 ENCOUNTER — Ambulatory Visit (INDEPENDENT_AMBULATORY_CARE_PROVIDER_SITE_OTHER): Payer: Medicare HMO

## 2023-05-07 DIAGNOSIS — R55 Syncope and collapse: Secondary | ICD-10-CM

## 2023-05-16 DIAGNOSIS — Z86010 Personal history of colonic polyps: Secondary | ICD-10-CM | POA: Diagnosis not present

## 2023-05-16 DIAGNOSIS — K648 Other hemorrhoids: Secondary | ICD-10-CM | POA: Diagnosis not present

## 2023-05-24 NOTE — Progress Notes (Signed)
Carelink Summary Report / Loop Recorder 

## 2023-05-25 ENCOUNTER — Other Ambulatory Visit: Payer: Self-pay | Admitting: Cardiology

## 2023-06-11 ENCOUNTER — Ambulatory Visit: Payer: Medicare HMO

## 2023-06-11 DIAGNOSIS — R55 Syncope and collapse: Secondary | ICD-10-CM

## 2023-06-11 DIAGNOSIS — I480 Paroxysmal atrial fibrillation: Secondary | ICD-10-CM

## 2023-06-12 NOTE — Progress Notes (Unsigned)
   Electrophysiology Office Note:   Date:  06/13/2023  ID:  Theresa Reynolds, DOB 08/06/1951, MRN [id number]  Primary Cardiologist: None Electrophysiologist: Lanier Prude, MD      History of Present Illness:   Theresa Reynolds is a 72 y.o. female with h/o PAF s/p ablation, carotid stenosis, HTN, HLD, hep C, OSA on CPAP, tobacco abuse,  and HTN seen today for routine electrophysiology followup.   Since last being seen in our clinic the patient reports doing very well. She did not a pinching pain along her left rib, but it was noted after swimming with a baby this past weekend.  she denies chest pain, palpitations, dyspnea, PND, orthopnea, nausea, vomiting, dizziness, syncope, edema, weight gain, or early satiety.   Review of systems complete and found to be negative unless listed in HPI.   Device History: Medtronic [id number] recorder implanted 04/2021 for  atrial fibrillation and dizziness.  After her [id number] device had met ERI.   Studies Reviewed:    EKG is ordered today. Personal review as below.  EKG Interpretation Date/Time:  Wednesday June 13 2023 08:57:31 EDT Ventricular Rate:  63 PR Interval:  190 QRS Duration:  78 QT Interval:  410 QTC Calculation: 419 R Axis:   20  Text Interpretation: Normal sinus rhythm Normal ECG When compared with ECG of 03-Jul-2017 14:36, No significant change was found Confirmed by Maxine Glenn [phone]) on 06/13/2023 9:02:00 AM    Physical Exam:   VS:  BP 124/62   Pulse 63   Ht 5\' 1"  (1.549 m)   Wt 161 lb 6.4 oz (73.2 kg)   SpO2 96%   BMI 30.50 kg/m    Wt Readings from Last 3 Encounters:  06/13/23 161 lb 6.4 oz (73.2 kg)  10/23/22 157 lb 9.6 oz (71.5 kg)  04/25/22 166 lb (75.3 kg)     GEN: Well nourished, well developed in no acute distress NECK: No JVD; No carotid bruits CARDIAC: Regular rate and rhythm, no murmurs, rubs, gallops RESPIRATORY:  Clear to auscultation without rales, wheezing or rhonchi  ABDOMEN: Soft, non-tender,  non-distended EXTREMITIES:  No edema; No deformity   ILR Interrogation- reviewed in detail today,  See PACEART report  ASSESSMENT AND PLAN:    Atrial fibrillation s/p Medtronic Loop recorder S/p Ablation 05/2017 Normal device function See Pace Art report No changes today 0% burden on loop recorder  HTN Stable on current regimen   Aortic regurg Prior echos at [facility] showed at least mild AI  Follow up with Dr. Lalla Brothers in 6 months  Signed, Graciella Freer, PA-C

## 2023-06-13 ENCOUNTER — Ambulatory Visit: Payer: Medicare HMO | Admitting: Student

## 2023-06-13 ENCOUNTER — Encounter: Payer: Self-pay | Admitting: Student

## 2023-06-13 VITALS — BP 124/62 | HR 63 | Ht 61.0 in | Wt 161.4 lb

## 2023-06-13 DIAGNOSIS — I10 Essential (primary) hypertension: Secondary | ICD-10-CM | POA: Diagnosis not present

## 2023-06-13 DIAGNOSIS — I480 Paroxysmal atrial fibrillation: Secondary | ICD-10-CM

## 2023-06-13 NOTE — Patient Instructions (Signed)
Medication Instructions:  Your physician recommends that you continue on your current medications as directed. Please refer to the Current Medication list given to you today.  *If you need a refill on your cardiac medications before your next appointment, please call your pharmacy*  Lab Work: None ordered If you have labs (blood work) drawn today and your tests are completely normal, you will receive your results only by: MyChart Message (if you have MyChart) OR A paper copy in the mail If you have any lab test that is abnormal or we need to change your treatment, we will call you to review the results.  Follow-Up: At [facility], you and your health needs are our priority.  As part of our continuing mission to provide you with exceptional heart care, we have created designated Provider Care Teams.  These Care Teams include your primary Cardiologist (physician) and Advanced Practice Providers (APPs -  Physician Assistants and Nurse Practitioners) who all work together to provide you with the care you need, when you need it.  Your next appointment:   6 month(s)  Provider:   Cameron Lambert, MD  

## 2023-06-25 NOTE — Progress Notes (Signed)
Carelink Summary Report / Loop Recorder 

## 2023-06-26 DIAGNOSIS — G4733 Obstructive sleep apnea (adult) (pediatric): Secondary | ICD-10-CM | POA: Diagnosis not present

## 2023-07-16 ENCOUNTER — Ambulatory Visit (INDEPENDENT_AMBULATORY_CARE_PROVIDER_SITE_OTHER): Payer: Medicare HMO

## 2023-07-16 DIAGNOSIS — I480 Paroxysmal atrial fibrillation: Secondary | ICD-10-CM | POA: Diagnosis not present

## 2023-07-16 DIAGNOSIS — R55 Syncope and collapse: Secondary | ICD-10-CM

## 2023-07-16 LAB — CUP PACEART REMOTE DEVICE CHECK
Date Time Interrogation Session: 20240906230758
Implantable Pulse Generator Implant Date: 20220613

## 2023-08-02 NOTE — Progress Notes (Signed)
Carelink Summary Report / Loop Recorder 

## 2023-08-14 DIAGNOSIS — M1712 Unilateral primary osteoarthritis, left knee: Secondary | ICD-10-CM | POA: Diagnosis not present

## 2023-08-20 ENCOUNTER — Ambulatory Visit (INDEPENDENT_AMBULATORY_CARE_PROVIDER_SITE_OTHER): Payer: Medicare HMO

## 2023-08-20 DIAGNOSIS — R55 Syncope and collapse: Secondary | ICD-10-CM

## 2023-08-21 LAB — CUP PACEART REMOTE DEVICE CHECK
Date Time Interrogation Session: 20241013231120
Implantable Pulse Generator Implant Date: 20220613

## 2023-09-04 NOTE — Progress Notes (Signed)
Carelink Summary Report / Loop Recorder 

## 2023-09-11 DIAGNOSIS — M1712 Unilateral primary osteoarthritis, left knee: Secondary | ICD-10-CM | POA: Diagnosis not present

## 2023-09-18 DIAGNOSIS — M1712 Unilateral primary osteoarthritis, left knee: Secondary | ICD-10-CM | POA: Diagnosis not present

## 2023-09-19 DIAGNOSIS — R051 Acute cough: Secondary | ICD-10-CM | POA: Diagnosis not present

## 2023-09-19 DIAGNOSIS — J018 Other acute sinusitis: Secondary | ICD-10-CM | POA: Diagnosis not present

## 2023-09-21 DIAGNOSIS — Z8619 Personal history of other infectious and parasitic diseases: Secondary | ICD-10-CM | POA: Diagnosis not present

## 2023-09-21 DIAGNOSIS — R059 Cough, unspecified: Secondary | ICD-10-CM | POA: Diagnosis not present

## 2023-09-24 ENCOUNTER — Ambulatory Visit (INDEPENDENT_AMBULATORY_CARE_PROVIDER_SITE_OTHER): Payer: Medicare HMO

## 2023-09-24 DIAGNOSIS — I480 Paroxysmal atrial fibrillation: Secondary | ICD-10-CM

## 2023-09-24 DIAGNOSIS — R55 Syncope and collapse: Secondary | ICD-10-CM

## 2023-09-24 LAB — CUP PACEART REMOTE DEVICE CHECK
Date Time Interrogation Session: 20241117230729
Implantable Pulse Generator Implant Date: 20220613

## 2023-09-25 DIAGNOSIS — M1712 Unilateral primary osteoarthritis, left knee: Secondary | ICD-10-CM | POA: Diagnosis not present

## 2023-09-27 DIAGNOSIS — G4733 Obstructive sleep apnea (adult) (pediatric): Secondary | ICD-10-CM | POA: Diagnosis not present

## 2023-10-18 NOTE — Progress Notes (Signed)
Carelink Summary Report / Loop Recorder 

## 2023-10-26 DIAGNOSIS — F5101 Primary insomnia: Secondary | ICD-10-CM | POA: Diagnosis not present

## 2023-10-26 DIAGNOSIS — I480 Paroxysmal atrial fibrillation: Secondary | ICD-10-CM | POA: Diagnosis not present

## 2023-10-26 DIAGNOSIS — Z23 Encounter for immunization: Secondary | ICD-10-CM | POA: Diagnosis not present

## 2023-10-26 DIAGNOSIS — R7303 Prediabetes: Secondary | ICD-10-CM | POA: Diagnosis not present

## 2023-10-26 DIAGNOSIS — D6869 Other thrombophilia: Secondary | ICD-10-CM | POA: Diagnosis not present

## 2023-10-26 DIAGNOSIS — F3341 Major depressive disorder, recurrent, in partial remission: Secondary | ICD-10-CM | POA: Diagnosis not present

## 2023-10-26 DIAGNOSIS — Z0000 Encounter for general adult medical examination without abnormal findings: Secondary | ICD-10-CM | POA: Diagnosis not present

## 2023-10-26 DIAGNOSIS — I10 Essential (primary) hypertension: Secondary | ICD-10-CM | POA: Diagnosis not present

## 2023-10-26 DIAGNOSIS — E782 Mixed hyperlipidemia: Secondary | ICD-10-CM | POA: Diagnosis not present

## 2023-10-26 DIAGNOSIS — E559 Vitamin D deficiency, unspecified: Secondary | ICD-10-CM | POA: Diagnosis not present

## 2023-11-05 ENCOUNTER — Ambulatory Visit (INDEPENDENT_AMBULATORY_CARE_PROVIDER_SITE_OTHER): Payer: Medicare HMO

## 2023-11-05 DIAGNOSIS — I480 Paroxysmal atrial fibrillation: Secondary | ICD-10-CM

## 2023-11-06 LAB — CUP PACEART REMOTE DEVICE CHECK
Date Time Interrogation Session: 20241222230205
Implantable Pulse Generator Implant Date: 20220613

## 2023-12-03 ENCOUNTER — Ambulatory Visit (INDEPENDENT_AMBULATORY_CARE_PROVIDER_SITE_OTHER): Payer: Medicare HMO

## 2023-12-03 DIAGNOSIS — R55 Syncope and collapse: Secondary | ICD-10-CM | POA: Diagnosis not present

## 2023-12-03 DIAGNOSIS — I480 Paroxysmal atrial fibrillation: Secondary | ICD-10-CM

## 2023-12-04 ENCOUNTER — Encounter: Payer: Self-pay | Admitting: Cardiology

## 2023-12-04 LAB — CUP PACEART REMOTE DEVICE CHECK
Date Time Interrogation Session: 20250126230325
Implantable Pulse Generator Implant Date: 20220613

## 2023-12-12 ENCOUNTER — Encounter: Payer: Self-pay | Admitting: Cardiology

## 2023-12-12 ENCOUNTER — Ambulatory Visit: Payer: Medicare HMO | Attending: Cardiology | Admitting: Cardiology

## 2023-12-12 VITALS — BP 128/70 | HR 64 | Ht 61.0 in | Wt 158.4 lb

## 2023-12-12 DIAGNOSIS — I480 Paroxysmal atrial fibrillation: Secondary | ICD-10-CM | POA: Diagnosis not present

## 2023-12-12 DIAGNOSIS — I351 Nonrheumatic aortic (valve) insufficiency: Secondary | ICD-10-CM | POA: Diagnosis not present

## 2023-12-12 DIAGNOSIS — I10 Essential (primary) hypertension: Secondary | ICD-10-CM

## 2023-12-12 NOTE — Progress Notes (Signed)
 Electrophysiology Office Follow up Visit Note:    Date:  12/12/2023   ID:  Theresa Reynolds, DOB November 27, 1950, MRN [id number]  PCP:  Theresa Prentice SAUNDERS, FNP  [facility] Cardiologist:  None  [facility] Electrophysiologist:  Theresa ONEIDA HOLTS, MD    Interval History:     Theresa Reynolds is a 73 y.o. female who presents for a follow up visit.   She was previously followed by Theresa Reynolds.  The patient last saw Theresa Reynolds June 13, 2023.  She has a history of atrial fibrillation post ablation, carotid stenosis, hypertension, hyperlipidemia, hepatitis C, sleep apnea on CPAP, tobacco abuse and hypertension.  She has a loop recorder that was implanted in June 2022. The patient reports she has been doing well.  She continues to take Eliquis .  She is recently started having nosebleeds that are unprovoked.  She is interested in avoiding long-term exposure to anticoagulation if at all possible and has researched the Watchman device.       Past medical, surgical, social and family history were reviewed.  ROS:   Please see the history of present illness.    All other systems reviewed and are negative.  EKGs/Labs/Other Studies Reviewed:    The following studies were reviewed today:  December 04, 2023 Loop recorder interrogation showed no A-fib        Physical Exam:    VS:  BP 128/70   Pulse 64   Ht 5' 1 (1.549 m)   Wt 158 lb 6.4 oz (71.8 kg)   SpO2 98%   BMI 29.93 kg/m     Wt Readings from Last 3 Encounters:  12/12/23 158 lb 6.4 oz (71.8 kg)  06/13/23 161 lb 6.4 oz (73.2 kg)  10/23/22 157 lb 9.6 oz (71.5 kg)     GEN: no distress CARD: RRR, No MRG RESP: No IWOB. CTAB.      ASSESSMENT:    1. Paroxysmal atrial fibrillation (HCC)   2. Aortic valve insufficiency, etiology of cardiac valve disease unspecified   3. Primary hypertension    PLAN:    In order of problems listed above:  #Paroxysmal atrial fibrillation With prior ablation by Theresa Reynolds Continue Eliquis  for  stroke prophylaxis The patient is very interested in discontinuing anticoagulation given the long-term potential for bleeding complication.  She is interested in landscape architect.  Today we discussed the Watchman procedure in detail include the risks, recovery and likelihood of success.  She is can think about it a bit more and then let us  know if she wants to proceed.  ----------------------  I have seen Theresa Reynolds in the office today who is being considered for a Watchman left atrial appendage closure device. I believe they will benefit from this procedure given their history of atrial fibrillation, CHA2DS2-VASc score of 4 and unadjusted ischemic stroke rate of 4.8% per year. Unfortunately, the patient is not felt to be a long term anticoagulation candidate secondary to epistaxis and desire to avoid long term exposure to anticoagulation. The patient's chart has been reviewed and I feel that they would be a candidate for short term oral anticoagulation after Watchman implant.   It is my belief that after undergoing a LAA closure procedure, Theresa Reynolds will not need long term anticoagulation which eliminates anticoagulation side effects and major bleeding risk.   Procedural risks for the Watchman implant have been reviewed with the patient including a 0.5% risk of stroke, <1% risk of perforation and <1% risk of device embolization.  Other risks include bleeding, vascular damage, tamponade, worsening renal function, and death. The patient understands these risk and wishes to proceed.     The published clinical data on the safety and effectiveness of WATCHMAN include but are not limited to the following: - Holmes DR, Jess BEARD, Sick P et al. for the PROTECT AF Investigators. Percutaneous closure of the left atrial appendage versus warfarin therapy for prevention of stroke in patients with atrial fibrillation: a randomised non-inferiority trial. Lancet 2009; 374: 534-42. GLENWOOD Jess BEARD, Doshi SK,  Jonita VEAR Satchel D et al. on behalf of the PROTECT AF Investigators. Percutaneous Left Atrial Appendage Closure for Stroke Prophylaxis in Patients With Atrial Fibrillation 2.3-Year Follow-up of the PROTECT AF (Watchman Left Atrial Appendage System for Embolic Protection in Patients With Atrial Fibrillation) Trial. Circulation 2013; 127:720-729. - Alli O, Doshi S,  Kar S, Reddy VY, Sievert H et al. Quality of Life Assessment in the Randomized PROTECT AF (Percutaneous Closure of the Left Atrial Appendage Versus Warfarin Therapy for Prevention of Stroke in Patients With Atrial Fibrillation) Trial of Patients at Risk for Stroke With Nonvalvular Atrial Fibrillation. J Am Coll Cardiol 2013; 61:1790-8. GLENWOOD Satchel DR, Archer RAMAN, Price M, Whisenant B, Sievert H, Doshi S, Huber K, Reddy V. Prospective randomized evaluation of the Watchman left atrial appendage Device in patients with atrial fibrillation versus long-term warfarin therapy; the PREVAIL trial. Journal of the [employer] of Cardiology, Vol. 4, No. 1, 2014, 1-11. - Kar S, Doshi SK, Sadhu A, Horton R, Osorio J et al. Primary outcome evaluation of a next-generation left atrial appendage closure device: results from the PINNACLE FLX trial. Circulation 2021;143(18)1754-1762.    After today's visit with the patient which was dedicated solely for shared decision making visit regarding LAA closure device, the patient decided to proceed with the LAA appendage closure procedure scheduled to be done in the near future at [facility].  She would not need a CT scan prior to the Watchman procedure if she elects to proceed.  HAS-BLED score 3 Hypertension Yes  Abnormal renal and liver function (Dialysis, transplant, Cr >2.26 mg/dL /Cirrhosis or Bilirubin >2x Normal or AST/ALT/AP >3x Normal) No  Stroke No  Bleeding Yes  Labile INR (Unstable/high INR) No  Elderly (>65) Yes  Drugs or alcohol  (>= 8 drinks/week, anti-plt or NSAID) No   CHA2DS2-VASc Score = 4   The patient's score is based upon: CHF History: 0 HTN History: 1 Diabetes History: 0 Stroke History: 0 Vascular Disease History: 1 Age Score: 1 Gender Score: 1   #Hypertension At goal today.  Recommend checking blood pressures 1-2 times per week at home and recording the values.  Recommend bringing these recordings to the primary care physician.  #Aortic insufficiency Repeat echocardiogram  Signed, Theresa Holts, MD, [facility], [facility] 12/12/2023 2:05 PM    Electrophysiology [facility]

## 2023-12-12 NOTE — Patient Instructions (Addendum)
 Medication Instructions:  Your physician recommends that you continue on your current medications as directed. Please refer to the Current Medication list given to you today.  *If you need a refill on your cardiac medications before your next appointment, please call your pharmacy*  Testing/Procedures: Echocardiogram Your physician has requested that you have an echocardiogram. Echocardiography is a painless test that uses sound waves to create images of your heart. It provides your doctor with information about the size and shape of your heart and how well your heart's chambers and valves are working. This procedure takes approximately one hour. There are no restrictions for this procedure. Please do NOT wear cologne, perfume, aftershave, or lotions (deodorant is allowed). Please arrive 15 minutes prior to your appointment time.  Please note: We ask at that you not bring children with you during ultrasound (echo/ vascular) testing. Due to room size and safety concerns, children are not allowed in the ultrasound rooms during exams. Our front office staff cannot provide observation of children in our lobby area while testing is being conducted. An adult accompanying a patient to their appointment will only be allowed in the ultrasound room at the discretion of the ultrasound technician under special circumstances. We apologize for any inconvenience.   Watchman Your physician has requested that you have Left atrial appendage (LAA) closure device implantation is a procedure to put a small device in the LAA of the heart. The LAA is a small sac in the wall of the heart's left upper chamber. Blood clots can form in this area. The device, Watchman closes the LAA to help prevent a blood clot and stroke.  Please contact Nurse Navigator, Rockie Redman at [phone] if you wish to proceed.     Follow-Up: At [facility], you and your health needs are our priority.  As part of our continuing mission to  provide you with exceptional heart care, we have created designated Provider Care Teams.  These Care Teams include your primary Cardiologist (physician) and Advanced Practice Providers (APPs -  Physician Assistants and Nurse Practitioners) who all work together to provide you with the care you need, when you need it.  Your next appointment:   1 year  Provider:   You will see one of the following Advanced Practice Providers on your designated Care Team:   Charlies Arthur, PA-C Michael Andy Tillery, PA-C Suzann Riddle, NP Daphne Barrack, NP

## 2023-12-18 ENCOUNTER — Telehealth: Payer: Self-pay

## 2023-12-18 NOTE — Telephone Encounter (Signed)
The patient called and reported she wishes to proceed with LAAO. She understood she will be called to arrange once cath lab schedule is finalized.

## 2023-12-22 ENCOUNTER — Other Ambulatory Visit: Payer: Self-pay | Admitting: Cardiology

## 2023-12-29 DIAGNOSIS — G4733 Obstructive sleep apnea (adult) (pediatric): Secondary | ICD-10-CM | POA: Diagnosis not present

## 2024-01-07 ENCOUNTER — Ambulatory Visit (INDEPENDENT_AMBULATORY_CARE_PROVIDER_SITE_OTHER): Payer: Medicare HMO

## 2024-01-07 ENCOUNTER — Ambulatory Visit (HOSPITAL_COMMUNITY): Payer: Medicare HMO | Attending: Cardiology

## 2024-01-07 DIAGNOSIS — I480 Paroxysmal atrial fibrillation: Secondary | ICD-10-CM

## 2024-01-07 DIAGNOSIS — R55 Syncope and collapse: Secondary | ICD-10-CM

## 2024-01-07 DIAGNOSIS — I351 Nonrheumatic aortic (valve) insufficiency: Secondary | ICD-10-CM | POA: Diagnosis not present

## 2024-01-07 LAB — ECHOCARDIOGRAM COMPLETE
AV Vena cont: 0.23 cm
Area-P 1/2: 3.91 cm2
S' Lateral: 2.18 cm

## 2024-01-08 LAB — CUP PACEART REMOTE DEVICE CHECK
Date Time Interrogation Session: 20250302230035
Implantable Pulse Generator Implant Date: 20220613

## 2024-01-11 NOTE — Addendum Note (Signed)
 Addended by: Geralyn Flash D on: 01/11/2024 10:39 AM   Modules accepted: Orders

## 2024-01-11 NOTE — Progress Notes (Signed)
 Carelink Summary Report / Loop Recorder

## 2024-01-17 ENCOUNTER — Encounter: Payer: Self-pay | Admitting: Cardiology

## 2024-01-21 ENCOUNTER — Other Ambulatory Visit: Payer: Self-pay

## 2024-01-21 ENCOUNTER — Telehealth: Payer: Self-pay

## 2024-01-21 DIAGNOSIS — I480 Paroxysmal atrial fibrillation: Secondary | ICD-10-CM

## 2024-01-21 MED ORDER — APIXABAN 5 MG PO TABS: 5.0000 mg | ORAL_TABLET | Freq: Two times a day (BID) | ORAL | 0 refills | Status: DC

## 2024-01-21 NOTE — Telephone Encounter (Signed)
 The patient agreed to [facility] 02/07/2024. Scheduled her for pre-procedure visit with Otilio Saber 02/01/2024 (shared decision visit needed for insurance coverage). The patient was grateful for call and agreed with plan.

## 2024-01-25 DIAGNOSIS — L821 Other seborrheic keratosis: Secondary | ICD-10-CM | POA: Diagnosis not present

## 2024-01-25 DIAGNOSIS — L280 Lichen simplex chronicus: Secondary | ICD-10-CM | POA: Diagnosis not present

## 2024-01-25 DIAGNOSIS — L708 Other acne: Secondary | ICD-10-CM | POA: Diagnosis not present

## 2024-01-25 DIAGNOSIS — D225 Melanocytic nevi of trunk: Secondary | ICD-10-CM | POA: Diagnosis not present

## 2024-01-25 DIAGNOSIS — L814 Other melanin hyperpigmentation: Secondary | ICD-10-CM | POA: Diagnosis not present

## 2024-01-31 NOTE — Progress Notes (Unsigned)
   Electrophysiology Office Note:   Date:  02/01/2024  ID:  DELLIE PIASECKI, DOB 11-22-1950, MRN [id number]  Primary Cardiologist: None Electrophysiologist: Lanier Prude, MD      History of Present Illness:   Theresa Reynolds is a 73 y.o. female with h/o PAF s/p ablation, carotid stenosis, HTN, HLD, hep C, OSA on CPAP, tobacco abuse, and HTN  seen today for routine electrophysiology followup.   Pt is pending Watchman 02/07/2024 and presents today for pre-visit.   Since last being seen in our clinic the patient reports doing very well. Currently,  she denies chest pain, palpitations, dyspnea, PND, orthopnea, nausea, vomiting, dizziness, syncope, edema, weight gain, or early satiety.    Review of systems complete and found to be negative unless listed in HPI.   Studies Reviewed:    EKG is ordered today. Personal review as below.  EKG Interpretation Date/Time:  Friday February 01 2024 08:01:23 EDT Ventricular Rate:  60 PR Interval:  182 QRS Duration:  78 QT Interval:  424 QTC Calculation: 424 R Axis:   56  Text Interpretation: Normal sinus rhythm Cannot rule out Anterior infarct , age undetermined When compared with ECG of 13-Jun-2023 08:57, No significant change was found Confirmed by Maxine Glenn [phone]) on 02/01/2024 8:05:41 AM      Device History: Medtronic loop recorder implanted for Atrial fibrillation  Physical Exam:   VS:  BP (!) 150/76   Pulse 63   Ht 5\' 1"  (1.549 m)   Wt 158 lb 3.2 oz (71.8 kg)   SpO2 93%   BMI 29.89 kg/m    Wt Readings from Last 3 Encounters:  02/01/24 158 lb 3.2 oz (71.8 kg)  12/12/23 158 lb 6.4 oz (71.8 kg)  06/13/23 161 lb 6.4 oz (73.2 kg)     GEN: No acute distress NECK: No JVD; No carotid bruits CARDIAC: Regular rate and rhythm, no murmurs, rubs, gallops RESPIRATORY:  Clear to auscultation without rales, wheezing or rhonchi  ABDOMEN: Soft, non-tender, non-distended EXTREMITIES:  No edema; No deformity   ILR Interrogation-  reviewed in detail today,  See PACEART report  ASSESSMENT AND PLAN:    Atrial fibrillation s/p Medtronic Loop recorder Secondary hypercoagulable state Normal device function at most recent check.  CHA2DS2VASc of at least 4 HAS-BLED score 3  Pt remains interested in discontinuing anticoagulation given the long-term potential for bleeding complication. Plans are to proceed with Watchman next week. Reviewed risks, benefits, and recovery and she remains agreeable to proceed.     Follow up with EP APP as usual post procedure  Signed, Graciella Freer, PA-C

## 2024-02-01 ENCOUNTER — Ambulatory Visit: Attending: Internal Medicine | Admitting: Student

## 2024-02-01 ENCOUNTER — Encounter: Payer: Self-pay | Admitting: Student

## 2024-02-01 VITALS — BP 150/76 | HR 63 | Ht 61.0 in | Wt 158.2 lb

## 2024-02-01 DIAGNOSIS — I4891 Unspecified atrial fibrillation: Secondary | ICD-10-CM | POA: Diagnosis not present

## 2024-02-01 DIAGNOSIS — I480 Paroxysmal atrial fibrillation: Secondary | ICD-10-CM

## 2024-02-01 DIAGNOSIS — D6869 Other thrombophilia: Secondary | ICD-10-CM | POA: Diagnosis not present

## 2024-02-01 NOTE — Patient Instructions (Signed)
 Medication Instructions:  Your physician recommends that you continue on your current medications as directed. Please refer to the Current Medication list given to you today.  *If you need a refill on your cardiac medications before your next appointment, please call your pharmacy*  Lab Work: BMET, CBC-TODAY If you have labs (blood work) drawn today and your tests are completely normal, you will receive your results only by: MyChart Message (if you have MyChart) OR A paper copy in the mail If you have any lab test that is abnormal or we need to change your treatment, we will call you to review the results.  Testing/Procedures: See letter  Follow-Up: At [facility], you and your health needs are our priority.  As part of our continuing mission to provide you with exceptional heart care, our providers are all part of one team.  This team includes your primary Cardiologist (physician) and Advanced Practice Providers or APPs (Physician Assistants and Nurse Practitioners) who all work together to provide you with the care you need, when you need it.  Your next appointment:   As scheduled   We recommend signing up for the patient portal called "MyChart".  Sign up information is provided on this After Visit Summary.  MyChart is used to connect with patients for Virtual Visits (Telemedicine).  Patients are able to view lab/test results, encounter notes, upcoming appointments, etc.  Non-urgent messages can be sent to your provider as well.   To learn more about what you can do with MyChart, go to [email or web address].      1st Floor: - Lobby - Registration  - Pharmacy  - Lab - Cafe  2nd Floor: - PV Lab - Diagnostic Testing (echo, CT, nuclear med)  3rd Floor: - Vacant  4th Floor: - TCTS (cardiothoracic surgery) - AFib Clinic - Structural Heart Clinic - Vascular Surgery  - Vascular Ultrasound  5th Floor: - HeartCare Cardiology (general and EP) - Clinical Pharmacy  for coumadin, hypertension, lipid, weight-loss medications, and med management appointments    Valet parking services will be available as well.

## 2024-02-02 LAB — CBC
Hematocrit: 44.1 % (ref 34.0–46.6)
Hemoglobin: 14.8 g/dL (ref 11.1–15.9)
MCH: 34.6 pg — ABNORMAL HIGH (ref 26.6–33.0)
MCHC: 33.6 g/dL (ref 31.5–35.7)
MCV: 103 fL — ABNORMAL HIGH (ref 79–97)
Platelets: 246 10*3/uL (ref 150–450)
RBC: 4.28 x10E6/uL (ref 3.77–5.28)
RDW: 12.1 % (ref 11.7–15.4)
WBC: 9.7 10*3/uL (ref 3.4–10.8)

## 2024-02-02 LAB — BASIC METABOLIC PANEL WITH GFR
BUN/Creatinine Ratio: 11 — ABNORMAL LOW (ref 12–28)
BUN: 11 mg/dL (ref 8–27)
CO2: 20 mmol/L (ref 20–29)
Calcium: 9.9 mg/dL (ref 8.7–10.3)
Chloride: 101 mmol/L (ref 96–106)
Creatinine, Ser: 0.96 mg/dL (ref 0.57–1.00)
Glucose: 101 mg/dL — ABNORMAL HIGH (ref 70–99)
Potassium: 5.1 mmol/L (ref 3.5–5.2)
Sodium: 138 mmol/L (ref 134–144)
eGFR: 63 mL/min/{1.73_m2} (ref >59)

## 2024-02-04 ENCOUNTER — Telehealth: Payer: Self-pay

## 2024-02-04 NOTE — Telephone Encounter (Signed)
 [name]

## 2024-02-05 ENCOUNTER — Telehealth: Payer: Self-pay

## 2024-02-05 NOTE — Telephone Encounter (Signed)
 Confirmed procedure date of 02/07/2024. Confirmed arrival time of 1100 for procedure time at 1330. Reviewed pre-procedure instructions with patient. The patient understands to call if questions/concerns arise prior to procedure.

## 2024-02-06 ENCOUNTER — Telehealth: Payer: Self-pay

## 2024-02-06 DIAGNOSIS — I480 Paroxysmal atrial fibrillation: Secondary | ICD-10-CM

## 2024-02-06 NOTE — Telephone Encounter (Signed)
 Received message from the Precert dept that a peer to peer was needed for insurance coverage for procedure tomorrow. Completed P2P with [employer] provider. She stated that the patient's procedure will be denied as she has not had any hospitalizations or ER visits for bleeding.  Left message for the patient to call back to confirm procedure is cancelled for tomorrow.

## 2024-02-06 NOTE — Telephone Encounter (Signed)
 Spoke with the patient at length. She understood that her insurance denied coverage for her procedure tomorrow. She understood that an appeal will be made but her insurance will not decide before tomorrow, so her procedure is cancelled.  Records requested from Dr. Renette Butters office [facility], phone: [phone]). Will include with appeal.

## 2024-02-07 ENCOUNTER — Inpatient Hospital Stay (HOSPITAL_COMMUNITY): Admission: RE | Admit: 2024-02-07 | Source: Home / Self Care | Admitting: Cardiology

## 2024-02-07 ENCOUNTER — Encounter (HOSPITAL_COMMUNITY): Admission: RE | Payer: Self-pay | Source: Home / Self Care

## 2024-02-07 SURGERY — LEFT ATRIAL APPENDAGE OCCLUSION
Anesthesia: General

## 2024-02-07 NOTE — Progress Notes (Signed)
 Carelink Summary Report / Loop Recorder

## 2024-02-07 NOTE — Telephone Encounter (Signed)
 Records received and given to Chart Prep to add to chart and the Precert Dept to send with appeal.

## 2024-02-07 NOTE — Addendum Note (Signed)
 Addended by: Geralyn Flash D on: 02/07/2024 02:45 PM   Modules accepted: Orders

## 2024-02-11 ENCOUNTER — Ambulatory Visit (INDEPENDENT_AMBULATORY_CARE_PROVIDER_SITE_OTHER): Payer: Medicare HMO

## 2024-02-11 DIAGNOSIS — I480 Paroxysmal atrial fibrillation: Secondary | ICD-10-CM | POA: Diagnosis not present

## 2024-02-11 DIAGNOSIS — R55 Syncope and collapse: Secondary | ICD-10-CM

## 2024-02-12 LAB — CUP PACEART REMOTE DEVICE CHECK
Date Time Interrogation Session: 20250406230051
Implantable Pulse Generator Implant Date: 20220613

## 2024-02-14 NOTE — Telephone Encounter (Signed)
 Received notification that the patient's appeal was approved and Berkley Harvey is on file for LAAO. Will notify the patient and reschedule procedure at her convenience.

## 2024-02-15 NOTE — Telephone Encounter (Signed)
 Pt called into the office for update on insurance coverage of LAAO.  I made her aware that Berkley Harvey is now on file and Orpha Bur will contact her to reschedule procedure.

## 2024-02-16 ENCOUNTER — Encounter: Payer: Self-pay | Admitting: Cardiology

## 2024-02-20 ENCOUNTER — Other Ambulatory Visit: Payer: Self-pay

## 2024-02-20 DIAGNOSIS — R040 Epistaxis: Secondary | ICD-10-CM

## 2024-02-20 DIAGNOSIS — K649 Unspecified hemorrhoids: Secondary | ICD-10-CM

## 2024-02-20 DIAGNOSIS — I480 Paroxysmal atrial fibrillation: Secondary | ICD-10-CM

## 2024-02-20 NOTE — Addendum Note (Signed)
 Addended by: Ariaunna Longsworth A on: 02/20/2024 09:48 AM   Modules accepted: Orders

## 2024-02-20 NOTE — Telephone Encounter (Signed)
 Rescheduled the patient for LAAO on 03/20/2024. She will have labs redrawn on 02/25/2024. She was grateful for call and agreed with plan.

## 2024-02-27 ENCOUNTER — Telehealth: Payer: Self-pay

## 2024-02-27 NOTE — Telephone Encounter (Signed)
 Left message to call back

## 2024-02-27 NOTE — Telephone Encounter (Signed)
 Confirmed procedure date of 02/28/2024. Confirmed arrival time of 0700 for procedure time at 0730. Reviewed pre-procedure instructions with patient. Confirmed no contrast allergy. She has a ILR but no PPM/defibrillator. The patient understands to call if questions/concerns arise prior to procedure. She was grateful for call and agreed with plan.

## 2024-02-28 ENCOUNTER — Inpatient Hospital Stay (HOSPITAL_COMMUNITY)
Admission: RE | Admit: 2024-02-28 | Discharge: 2024-02-28 | DRG: 274 | Disposition: A | Discharge status: Home / Self Care | Attending: Cardiology | Admitting: Cardiology

## 2024-02-28 ENCOUNTER — Inpatient Hospital Stay (HOSPITAL_COMMUNITY): Admitting: Anesthesiology

## 2024-02-28 ENCOUNTER — Inpatient Hospital Stay (HOSPITAL_COMMUNITY)

## 2024-02-28 ENCOUNTER — Encounter (HOSPITAL_COMMUNITY)
Admission: RE | Disposition: A | Discharge status: Home / Self Care | Payer: Self-pay | Source: Home / Self Care | Attending: Cardiology

## 2024-02-28 ENCOUNTER — Encounter (HOSPITAL_COMMUNITY): Payer: Self-pay | Admitting: Cardiology

## 2024-02-28 DIAGNOSIS — I480 Paroxysmal atrial fibrillation: Principal | ICD-10-CM | POA: Diagnosis present

## 2024-02-28 DIAGNOSIS — I10 Essential (primary) hypertension: Secondary | ICD-10-CM

## 2024-02-28 DIAGNOSIS — I4891 Unspecified atrial fibrillation: Principal | ICD-10-CM | POA: Diagnosis present

## 2024-02-28 DIAGNOSIS — Z8673 Personal history of transient ischemic attack (TIA), and cerebral infarction without residual deficits: Secondary | ICD-10-CM

## 2024-02-28 DIAGNOSIS — Z006 Encounter for examination for normal comparison and control in clinical research program: Secondary | ICD-10-CM | POA: Diagnosis not present

## 2024-02-28 DIAGNOSIS — I351 Nonrheumatic aortic (valve) insufficiency: Secondary | ICD-10-CM | POA: Diagnosis present

## 2024-02-28 DIAGNOSIS — G4730 Sleep apnea, unspecified: Secondary | ICD-10-CM | POA: Diagnosis not present

## 2024-02-28 DIAGNOSIS — F418 Other specified anxiety disorders: Secondary | ICD-10-CM | POA: Diagnosis not present

## 2024-02-28 DIAGNOSIS — E785 Hyperlipidemia, unspecified: Secondary | ICD-10-CM | POA: Diagnosis present

## 2024-02-28 DIAGNOSIS — Z7901 Long term (current) use of anticoagulants: Secondary | ICD-10-CM

## 2024-02-28 DIAGNOSIS — K649 Unspecified hemorrhoids: Secondary | ICD-10-CM

## 2024-02-28 DIAGNOSIS — Z7902 Long term (current) use of antithrombotics/antiplatelets: Secondary | ICD-10-CM | POA: Diagnosis not present

## 2024-02-28 DIAGNOSIS — Z87891 Personal history of nicotine dependence: Secondary | ICD-10-CM

## 2024-02-28 DIAGNOSIS — F17200 Nicotine dependence, unspecified, uncomplicated: Secondary | ICD-10-CM | POA: Diagnosis not present

## 2024-02-28 DIAGNOSIS — I083 Combined rheumatic disorders of mitral, aortic and tricuspid valves: Secondary | ICD-10-CM | POA: Diagnosis not present

## 2024-02-28 DIAGNOSIS — G4733 Obstructive sleep apnea (adult) (pediatric): Secondary | ICD-10-CM | POA: Diagnosis present

## 2024-02-28 DIAGNOSIS — Z01818 Encounter for other preprocedural examination: Secondary | ICD-10-CM | POA: Diagnosis not present

## 2024-02-28 DIAGNOSIS — R040 Epistaxis: Secondary | ICD-10-CM

## 2024-02-28 HISTORY — PX: LEFT ATRIAL APPENDAGE OCCLUSION: EP1229

## 2024-02-28 HISTORY — PX: TRANSESOPHAGEAL ECHOCARDIOGRAM (CATH LAB): EP1270

## 2024-02-28 LAB — ECHO TEE
AR max vel: 1.59 cm2
AV Area VTI: 1.61 cm2
AV Area mean vel: 1.5 cm2
AV Mean grad: 6 mmHg
AV Peak grad: 10.5 mmHg
Ao pk vel: 1.62 m/s

## 2024-02-28 LAB — TYPE AND SCREEN
ABO/RH(D): O NEG
Antibody Screen: NEGATIVE

## 2024-02-28 LAB — ABO/RH: ABO/RH(D): O NEG

## 2024-02-28 SURGERY — LEFT ATRIAL APPENDAGE OCCLUSION
Anesthesia: General

## 2024-02-28 MED ORDER — SODIUM CHLORIDE 0.9% FLUSH: 3.0000 mL | INTRAVENOUS | Status: DC | PRN

## 2024-02-28 MED ORDER — APIXABAN 5 MG PO TABS
5.0000 mg | ORAL_TABLET | Freq: Two times a day (BID) | ORAL | Status: DC
  Administered 2024-02-28: 5 mg via ORAL

## 2024-02-28 MED ORDER — CHLORHEXIDINE GLUCONATE 4 % EX SOLN
Freq: Once | CUTANEOUS | Status: AC
  Filled 2024-02-28: qty 15

## 2024-02-28 MED ORDER — OXYCODONE HCL 5 MG PO TABS
ORAL_TABLET | ORAL | Status: AC
  Filled 2024-02-28: qty 1

## 2024-02-28 MED ORDER — OXYCODONE HCL 5 MG/5ML PO SOLN: 5.0000 mg | Freq: Once | ORAL | Status: AC | PRN

## 2024-02-28 MED ORDER — ROCURONIUM BROMIDE 10 MG/ML (PF) SYRINGE
PREFILLED_SYRINGE | INTRAVENOUS | Status: DC | PRN
  Administered 2024-02-28: 60 mg via INTRAVENOUS

## 2024-02-28 MED ORDER — PROPOFOL 10 MG/ML IV BOLUS
INTRAVENOUS | Status: DC | PRN
  Administered 2024-02-28: 150 mg via INTRAVENOUS

## 2024-02-28 MED ORDER — HEPARIN (PORCINE) IN NACL 2000-0.9 UNIT/L-% IV SOLN
INTRAVENOUS | Status: DC | PRN
  Administered 2024-02-28: 1000 mL

## 2024-02-28 MED ORDER — ACETAMINOPHEN 325 MG PO TABS
650.0000 mg | ORAL_TABLET | Freq: Once | ORAL | Status: AC
  Administered 2024-02-28: 650 mg via ORAL
  Filled 2024-02-28: qty 2

## 2024-02-28 MED ORDER — ONDANSETRON HCL 4 MG/2ML IJ SOLN: 4.0000 mg | Freq: Four times a day (QID) | INTRAMUSCULAR | Status: DC | PRN

## 2024-02-28 MED ORDER — IOHEXOL 350 MG/ML SOLN
INTRAVENOUS | Status: DC | PRN
  Administered 2024-02-28 (×2): 10 mL

## 2024-02-28 MED ORDER — PROTAMINE SULFATE 10 MG/ML IV SOLN
INTRAVENOUS | Status: DC | PRN
  Administered 2024-02-28: 35 mg via INTRAVENOUS

## 2024-02-28 MED ORDER — CHLORHEXIDINE GLUCONATE 0.12 % MT SOLN
OROMUCOSAL | Status: AC
  Administered 2024-02-28: 15 mL via OROMUCOSAL
  Filled 2024-02-28: qty 15

## 2024-02-28 MED ORDER — CHLORHEXIDINE GLUCONATE 0.12 % MT SOLN: 15.0000 mL | Freq: Once | OROMUCOSAL | Status: AC

## 2024-02-28 MED ORDER — SODIUM CHLORIDE 0.9 % IV SOLN
INTRAVENOUS | Status: DC
Start: 2024-02-28 — End: 2024-02-28

## 2024-02-28 MED ORDER — CEFAZOLIN SODIUM-DEXTROSE 2-4 GM/100ML-% IV SOLN
2.0000 g | INTRAVENOUS | Status: AC
  Administered 2024-02-28: 2 g via INTRAVENOUS
  Filled 2024-02-28: qty 100

## 2024-02-28 MED ORDER — HEPARIN SODIUM (PORCINE) 1000 UNIT/ML IJ SOLN
INTRAMUSCULAR | Status: DC | PRN
  Administered 2024-02-28: 11000 [IU] via INTRAVENOUS

## 2024-02-28 MED ORDER — ONDANSETRON HCL 4 MG/2ML IJ SOLN
INTRAMUSCULAR | Status: DC | PRN
  Administered 2024-02-28: 4 mg via INTRAVENOUS

## 2024-02-28 MED ORDER — SODIUM CHLORIDE 0.9 % IV SOLN: 250.0000 mL | INTRAVENOUS | Status: DC | PRN

## 2024-02-28 MED ORDER — OXYCODONE HCL 5 MG PO TABS
5.0000 mg | ORAL_TABLET | Freq: Once | ORAL | Status: AC | PRN
  Administered 2024-02-28: 5 mg via ORAL

## 2024-02-28 MED ORDER — SODIUM CHLORIDE 0.9% FLUSH: 3.0000 mL | Freq: Two times a day (BID) | INTRAVENOUS | Status: DC

## 2024-02-28 MED ORDER — APIXABAN 5 MG PO TABS
ORAL_TABLET | ORAL | Status: AC
  Filled 2024-02-28: qty 1

## 2024-02-28 MED ORDER — FENTANYL CITRATE (PF) 100 MCG/2ML IJ SOLN: 25.0000 ug | INTRAMUSCULAR | Status: DC | PRN

## 2024-02-28 MED ORDER — SUGAMMADEX SODIUM 200 MG/2ML IV SOLN
INTRAVENOUS | Status: DC | PRN
  Administered 2024-02-28: 200 mg via INTRAVENOUS

## 2024-02-28 MED ORDER — DEXAMETHASONE SODIUM PHOSPHATE 10 MG/ML IJ SOLN
INTRAMUSCULAR | Status: DC | PRN
  Administered 2024-02-28: 4 mg via INTRAVENOUS

## 2024-02-28 MED ORDER — LACTATED RINGERS IV SOLN
INTRAVENOUS | Status: DC
Start: 2024-02-28 — End: 2024-02-28

## 2024-02-28 MED ORDER — ACETAMINOPHEN 325 MG PO TABS: 650.0000 mg | ORAL_TABLET | ORAL | Status: DC | PRN

## 2024-02-28 SURGICAL SUPPLY — 18 items
BLANKET WARM UNDERBOD FULL ACC (MISCELLANEOUS) ×1 IMPLANT
CATH INFINITI 5FR ANG PIGTAIL (CATHETERS) IMPLANT
CATH WATCHMAN STEER ACCESS SYS (CATHETERS) IMPLANT
CLOSURE PERCLOSE PROSTYLE (VASCULAR PRODUCTS) IMPLANT
DEVICE WATCHMAN FLX PRO PROC (KITS) IMPLANT
DEVICE WATCHMAN TRUSTEER PROC (KITS) IMPLANT
DILATOR VESSEL 38 20CM 12FR (INTRODUCER) IMPLANT
KIT VERSACROSS CON 12FR 85 (KITS) IMPLANT
PACK CARDIAC CATHETERIZATION (CUSTOM PROCEDURE TRAY) ×1 IMPLANT
PAD DEFIB RADIO PHYSIO CONN (PAD) ×1 IMPLANT
SHEATH PERFORMER 18FRX30 (VASCULAR PRODUCTS) IMPLANT
SHEATH PINNACLE 8F 10CM (SHEATH) IMPLANT
SHEATH PROBE COVER 6X72 (BAG) ×1 IMPLANT
SYR CONTROL 10ML ANGIOGRAPHIC (SYRINGE) IMPLANT
TRANSDUCER W/STOPCOCK (MISCELLANEOUS) ×1 IMPLANT
WATCHMAN FLX PRO 40 (Prosthesis & Implant Heart) IMPLANT
WATCHMAN FLX PRO PROCEDURE (KITS) ×1 IMPLANT
WATCHMAN TRUSTEER PROCEDURE (KITS) ×1 IMPLANT

## 2024-02-28 NOTE — Discharge Instructions (Signed)
 [facility] Procedure, Care After  Procedure MD: Dr. Isidoro Donning Clinical Coordinator: Karsten Fells, RN  This sheet gives you information about how to care for yourself after your procedure. Your health care provider may also give you more specific instructions. If you have problems or questions, contact your health care provider.  What can I expect after the procedure? After the procedure, it is common to have: Bruising around your puncture site. Tenderness around your puncture site. Tiredness (fatigue).  Medication instructions It is very important to continue to take your blood thinner as directed by your doctor after the Watchman procedure. Call your procedure doctor's office with question or concerns. If you are on Coumadin (warfarin), you will have your INR checked the week after your procedure, with a goal INR of 2.0 - 3.0. Please follow your medication instructions on your discharge summary. Only take the medications listed on your discharge paperwork.  Follow up You will be seen in 6 weeks after your procedure You will have a repeat CT scan or Echocardiogram approximately 8 weeks after your procedure mark to check your device You will follow up the MD/APP who performed your procedure 6 months after your procedure The Watchman Clinical Coordinator will check in with you from time to time, including 1 and 2 years after your procedure.  NO DENTAL CLEANINGS FOR 45 days. After that, you will require antibiotics for dental procedures the first 6 months.   Follow these instructions at home: Puncture site care  Follow instructions from your health care provider about how to take care of your puncture site. Make sure you: If present, leave stitches (sutures), skin glue, or adhesive strips in place.  If a large square bandage is present, this may be removed 24 hours after surgery.  Check your puncture site every day for signs of infection. Check for: Redness, swelling, or pain. Fluid  or blood. If your puncture site starts to bleed, lie down on your back, apply firm pressure to the area, and contact your health care provider. Warmth. Pus or a bad smell. Driving Do not drive yourself home if you received sedation Do not drive for at least 4 days after your procedure or however long your health care provider recommends. (Do not resume driving if you have previously been instructed not to drive for other health reasons.) Do not spend greater than 1 hour at a time in a car for the first 3 days. Stop and take a break with a 5 minute walk at least every hour.  Do not drive or use heavy machinery while taking prescription pain medicine.  Activity Avoid activities that take a lot of effort, including exercise, for at least 7 days after your procedure. For the first 3 days, avoid sitting for longer than one hour at a time.  Avoid alcoholic beverages, signing paperwork, or participating in legal proceedings for 24 hours after receiving sedation Do not lift anything that is heavier than 10 lb (4.5 kg) for one week.  No sexual activity for 1 week.  Return to your normal activities as told by your health care provider. Ask your health care provider what activities are safe for you. General instructions Take over-the-counter and prescription medicines only as told by your health care provider. Do not use any products that contain nicotine or tobacco, such as cigarettes and e-cigarettes. If you need help quitting, ask your health care provider. You may shower after 24 hours, but Do not take baths, swim, or use a hot tub for  1 week.  Do not drink alcohol for 24 hours after your procedure. Keep all follow-up visits as told by your health care provider. This is important. Dental Work: You will require antibiotics prior to any dental work, including cleanings, for 6 months after your Watchman implantation to help protect you from infection. After 6 months, antibiotics are no longer  required. Contact a health care provider if: You have redness, mild swelling, or pain around your puncture site. You have soreness in your throat or at your puncture site that does not improve after several days You have fluid or blood coming from your puncture site that stops after applying firm pressure to the area. Your puncture site feels warm to the touch. You have pus or a bad smell coming from your puncture site. You have a fever. You have chest pain or discomfort that spreads to your neck, jaw, or arm. You are sweating a lot. You feel nauseous. You have a fast or irregular heartbeat. You have shortness of breath. You are dizzy or light-headed and feel the need to lie down. You have pain or numbness in the arm or leg closest to your puncture site. Get help right away if: Your puncture site suddenly swells. Your puncture site is bleeding and the bleeding does not stop after applying firm pressure to the area. These symptoms may represent a serious problem that is an emergency. Do not wait to see if the symptoms will go away. Get medical help right away. Call your local emergency services (911 in the [place] Do not drive yourself to the hospital. Summary After the procedure, it is normal to have bruising and tenderness at the puncture site in your groin, neck, or forearm. Check your puncture site every day for signs of infection. Get help right away if your puncture site is bleeding and the bleeding does not stop after applying firm pressure to the area. This is a medical emergency.  This information is not intended to replace advice given to you by your health care provider. Make sure you discuss any questions you have with your health care provider.

## 2024-02-28 NOTE — H&P (Signed)
 Electrophysiology Office Follow up Visit Note:     Date:  02/28/2024    ID:  Theresa Reynolds, DOB October 15, 1951, MRN [id number]   PCP:  Theresa Hurt, FNP  [facility] Cardiologist:  None  [facility] Electrophysiologist:  Theresa Byes, MD      Interval History:       Theresa Reynolds is Reynolds 73 y.Reynolds. female who presents for Reynolds follow up visit.    She was previously followed by Theresa Reynolds.  The patient last saw Theresa Reynolds June 13, 2023.  She has Reynolds history of atrial fibrillation post ablation, carotid stenosis, hypertension, hyperlipidemia, hepatitis C, sleep apnea on CPAP, tobacco abuse and hypertension.  She has Reynolds loop recorder that was implanted in June 2022. The patient reports she has been doing well.  She continues to take Eliquis .  She is recently started having nosebleeds that are unprovoked.  She is interested in avoiding long-term exposure to anticoagulation if at all possible and has researched the Watchman device.  Presents for Theresa Reynolds implant. Procedure reviewed.   Objective Past medical, surgical, social and family history were reviewed.   ROS:   Please see the history of present illness.    All other systems reviewed and are negative.   EKGs/Labs/Other Studies Reviewed:     The following studies were reviewed today:   December 04, 2023 Loop recorder interrogation showed no Reynolds-fib           Physical Exam:     VS:  BP 147/60   Pulse 65   Ht 5\' 1"  (1.549 Reynolds)   Wt 158 lb 6.4 oz (71.8 kg)   SpO2 98%   BMI 29.93 kg/Reynolds         Wt Readings from Last 3 Encounters:  12/12/23 158 lb 6.4 oz (71.8 kg)  06/13/23 161 lb 6.4 oz (73.2 kg)  10/23/22 157 lb 9.6 oz (71.5 kg)      GEN: no distress CARD: RRR, No MRG RESP: No IWOB. CTAB.     Assessment ASSESSMENT:     1. Paroxysmal atrial fibrillation (HCC)   2. Aortic valve insufficiency, etiology of cardiac valve disease unspecified   3. Primary hypertension     PLAN:     In order of problems listed above:    #Paroxysmal atrial fibrillation With prior ablation by Theresa Reynolds Continue Eliquis  for stroke prophylaxis The patient is very interested in discontinuing anticoagulation given the long-term potential for bleeding complication.  She is interested in Landscape architect.  Today we discussed the Watchman procedure in detail include the risks, recovery and likelihood of success.  She is can think about it Reynolds bit more and then let us  know if she wants to proceed.   ----------------------   I have seen Theresa Reynolds in the office today who is being considered for Reynolds Watchman left atrial appendage closure device. I believe they will benefit from this procedure given their history of atrial fibrillation, CHA2DS2-VASc score of 4 and unadjusted ischemic stroke rate of 4.8% per year. Unfortunately, the patient is not felt to be Reynolds long term anticoagulation candidate secondary to epistaxis and desire to avoid long term exposure to anticoagulation. The patient'Reynolds chart has been reviewed and I feel that they would be Reynolds candidate for short term oral anticoagulation after Watchman implant.    It is my belief that after undergoing Reynolds LAA closure procedure, Theresa Reynolds will not need long term anticoagulation which eliminates anticoagulation side effects and major  bleeding risk.    Procedural risks for the Watchman implant have been reviewed with the patient including Reynolds 0.5% risk of stroke, <1% risk of perforation and <1% risk of device embolization. Other risks include bleeding, vascular damage, tamponade, worsening renal function, and death. The patient understands these risk and wishes to proceed.       The published clinical data on the safety and effectiveness of WATCHMAN include but are not limited to the following: - Theresa Reynolds, Theresa Reynolds, Theresa Reynolds et al. for the PROTECT AF Investigators. Percutaneous closure of the left atrial appendage versus warfarin therapy for prevention of stroke in patients with atrial  fibrillation: Reynolds randomised non-inferiority trial. Lancet 2009; 374: 534-42. Theresa Reynolds, Theresa Reynolds, Theresa Reynolds et al. on behalf of the PROTECT AF Investigators. Percutaneous Left Atrial Appendage Closure for Stroke Prophylaxis in Patients With Atrial Fibrillation 2.3-Year Follow-up of the PROTECT AF (Watchman Left Atrial Appendage System for Embolic Protection in Patients With Atrial Fibrillation) Trial. Circulation 2013; 127:720-729. - Theresa Reynolds, Theresa Reynolds,  Theresa Reynolds, Theresa Reynolds, Theresa Reynolds et al. Quality of Life Assessment in the Randomized PROTECT AF (Percutaneous Closure of the Left Atrial Appendage Versus Warfarin Therapy for Prevention of Stroke in Patients With Atrial Fibrillation) Trial of Patients at Risk for Stroke With Nonvalvular Atrial Fibrillation. Reynolds Am Coll Cardiol 2013; 61:1790-8. Theresa Reynolds, Theresa Reynolds, Theresa Reynolds, Theresa Reynolds, Theresa Reynolds, Theresa Reynolds, Theresa Reynolds, Theresa Reynolds. Prospective randomized evaluation of the Watchman left atrial appendage Device in patients with atrial fibrillation versus long-term warfarin therapy; the PREVAIL trial. Journal of the [employer] of Cardiology, Vol. 4, No. 1, 2014, 1-11. - Theresa Reynolds, Theresa Reynolds, Theresa Reynolds, Theresa Reynolds, Theresa Reynolds et al. Primary outcome evaluation of Reynolds next-generation left atrial appendage closure device: results from the PINNACLE FLX trial. Circulation 2021;143(18)1754-1762.      After today'Reynolds visit with the patient which was dedicated solely for shared decision making visit regarding LAA closure device, the patient decided to proceed with the LAA appendage closure procedure scheduled to be done in the near future at [facility].   She would not need Reynolds CT scan prior to the Watchman procedure if she elects to proceed.   HAS-BLED score 3 Hypertension Yes  Abnormal renal and liver function (Dialysis, transplant, Cr >2.26 mg/dL /Cirrhosis or Bilirubin >2x Normal or AST/ALT/AP >3x Normal) No  Stroke No  Bleeding Yes  Labile INR (Unstable/high INR) No   Elderly (>65) Yes  Drugs or alcohol (>= 8 drinks/week, anti-plt or NSAID) No    CHA2DS2-VASc Score = 4  The patient'Reynolds score is based upon: CHF History: 0 HTN History: 1 Diabetes History: 0 Stroke History: 0 Vascular Disease History: 1 Age Score: 1 Gender Score: 1     #Hypertension At goal today.  Recommend checking blood pressures 1-2 times per week at home and recording the values.  Recommend bringing these recordings to the primary care physician.   #Aortic insufficiency Repeat echocardiogram    Presents for Watchman implant today. Procedure reviewed.   Signed, Harvie Liner, MD, [facility], [facility] 02/28/2024 Electrophysiology [facility]

## 2024-02-28 NOTE — Progress Notes (Signed)
 Discharge teaching complete. Meds, diet, activity, follow up appointments, cath site care all reviewed and questions answered. Copy of instructions given to patient.

## 2024-02-28 NOTE — Progress Notes (Signed)
 Patient arrived to room 6E10. VS stable and right groin level 0.

## 2024-02-28 NOTE — Transfer of Care (Signed)
 Immediate Anesthesia Transfer of Care Note  Patient: Theresa Reynolds  Procedure(s) Performed: LEFT ATRIAL APPENDAGE OCCLUSION TRANSESOPHAGEAL ECHOCARDIOGRAM  Patient Location: Cath Lab  Anesthesia Type:General  Level of Consciousness: awake, alert , and oriented  Airway & Oxygen Therapy: Patient Spontanous Breathing and Patient connected to face mask oxygen  Post-op Assessment: Report given to RN and Post -op Vital signs reviewed and stable  Post vital signs: Reviewed and stable  Last Vitals:  Vitals Value Taken Time  BP 127/101 02/28/24 1137  Temp    Pulse 56 02/28/24 1143  Resp 20 02/28/24 1143  SpO2 99 % 02/28/24 1143  Vitals shown include unfiled device data.  Last Pain:  Vitals:   02/28/24 1137  TempSrc:   PainSc: 0-No pain         Complications: No notable events documented.

## 2024-02-28 NOTE — Anesthesia Preprocedure Evaluation (Addendum)
 Anesthesia Evaluation  Patient identified by MRN, date of birth, ID band Patient awake    Reviewed: Allergy & Precautions, NPO status , Patient's Chart, lab work & pertinent test results, reviewed documented beta blocker date and time   History of Anesthesia Complications Negative for: history of anesthetic complications  Airway Mallampati: III  TM Distance: >3 FB     Dental no notable dental hx.    Pulmonary sleep apnea , neg COPD, Current Smoker and Patient abstained from smoking.   breath sounds clear to auscultation       Cardiovascular hypertension, (-) angina (-) CAD, (-) Past MI and (-) Cardiac Stents + dysrhythmias Atrial Fibrillation + Valvular Problems/Murmurs (moderate AI) AI  Rhythm:Regular Rate:Normal  Normal BiV function on last TTE   Neuro/Psych neg Seizures PSYCHIATRIC DISORDERS Anxiety Depression     Neuromuscular disease    GI/Hepatic ,,,(+) Hepatitis -, C  Endo/Other    Renal/GU      Musculoskeletal   Abdominal   Peds  Hematology   Anesthesia Other Findings   Reproductive/Obstetrics                             Anesthesia Physical Anesthesia Plan  ASA: 3  Anesthesia Plan: General   Post-op Pain Management:    Induction: Intravenous  PONV Risk Score and Plan: 2 and Ondansetron  and Dexamethasone   Airway Management Planned: Oral ETT  Additional Equipment:   Intra-op Plan:   Post-operative Plan: Extubation in OR  Informed Consent: I have reviewed the patients History and Physical, chart, labs and discussed the procedure including the risks, benefits and alternatives for the proposed anesthesia with the patient or authorized representative who has indicated his/her understanding and acceptance.     Dental advisory given  Plan Discussed with: CRNA  Anesthesia Plan Comments:        Anesthesia Quick Evaluation

## 2024-02-28 NOTE — Anesthesia Postprocedure Evaluation (Signed)
 Anesthesia Post Note  Patient: Theresa Reynolds  Procedure(s) Performed: LEFT ATRIAL APPENDAGE OCCLUSION TRANSESOPHAGEAL ECHOCARDIOGRAM     Patient location during evaluation: PACU Anesthesia Type: General Level of consciousness: awake and alert Pain management: pain level controlled Vital Signs Assessment: post-procedure vital signs reviewed and stable Respiratory status: spontaneous breathing, nonlabored ventilation, respiratory function stable and patient connected to nasal cannula oxygen Cardiovascular status: blood pressure returned to baseline and stable Postop Assessment: no apparent nausea or vomiting Anesthetic complications: no   No notable events documented.  Last Vitals:  Vitals:   02/28/24 1400 02/28/24 1430  BP: (!) 154/63 (!) 133/58  Pulse: (!) 56 (!) 53  Resp: 15 17  Temp:  36.7 C  SpO2:  92%    Last Pain:  Vitals:   02/28/24 1430  TempSrc: Oral  PainSc:                  Leslye Rast

## 2024-02-28 NOTE — Discharge Summary (Signed)
 Electrophysiology Discharge Summary   Patient ID: Theresa Reynolds,  MRN: [id number], DOB/AGE: [birth year] 71 y.o.  Admit date: 02/28/2024 Discharge date: 02/28/2024  Primary Care Physician: Alejandro Hurt, FNP  Primary Cardiologist: None  Electrophysiologist: CAMERON T LAMBERT, MD  Primary Discharge Diagnosis:  Paroxysmal Atrial Fibrillation Poor candidacy for long term anticoagulation due to frequent nosebleeds As well as hemorrhoidal bleeding requiring intervention CHA2DS2Vasc is 4  Secondary Discharge Diagnosis:  HTN HLD OSA w/CPAP Carotid disease  Procedures This Admission:  Transeptal Puncture Intra-procedural TEE which showed no LAA thrombus Left atrial appendage occlusive device placement on 02/28/24 by Dr. Marven Slimmer.  CONCLUSIONS:  1.Successful implantation of a WATCHMAN left atrial appendage occlusive device    2. TEE demonstrating no LAA thrombus 3. No early apparent complications.      Post Implant Anticoagulation Strategy: Continue Eliquis  5mg  by mouth twice daily for 45 days. After 45 days, stop Eliquis  and start Plavix 75mg  by mouth twice daily to complete 6 months of post implant medical therapy. Plan for CT scan 60 days after implant to assess appendage patency and Watchman position.   Brief HPI: Theresa Reynolds is a 73 y.o. female with a history of Paroxysmal Atrial Fibrillation who is followed  Electrophysiology in the outpatient setting (w/hx of AFib ablation 2018)   Hospital Course:  The patient was admitted and underwent left atrial appendage occlusive device placement as above.  The patient was monitored in the post procedure setting and has done very well with no concerns. Given this, he/she is being considered for same day discharge later today. Groin site has been stable without evidence of hematoma or bleeding. Wound care and restrictions were reviewed with the patient.   The patient has been scheduled for post procedure follow up with EP APP in  approximately 6 weeks. They will restart Eliquis  this evening and continue for 45 days then stop. At that time she will transition to Plavix 75mg  daily to complete 6 months of therapy. They will require dental SBE for 6 month post op and should refrain from dental work or cleanings for the first 45 days post implant. SBE to be RXd at follow up.   A repeat CT scan will be performed in approximately 60 days to ensure proper seal of the device.    Physical Exam: Vitals:   02/28/24 1320 02/28/24 1330 02/28/24 1400 02/28/24 1430  BP: 135/61 137/71 (!) 154/63 (!) 133/58  Pulse: (!) 52 (!) 55 (!) 56 (!) 53  Resp: 18 18 15 17   Temp:    98.1 F (36.7 C)  TempSrc:    Oral  SpO2: 93% 92%  92%  Weight:      Height:        GEN: Well nourished, well developed in no acute distress NECK: No JVD; No carotid bruits CARDIAC: Regular rate and rhythm, no murmurs, rubs, gallops RESPIRATORY:  Clear to auscultation without rales, wheezing or rhonchi  ABDOMEN: Soft, non-tender, non-distended EXTREMITIES:  No edema; No deformity. Groin sites are  Stable  b/l with no hematoma, bleeding   Discharge Medications:  Allergies as of 02/28/2024       Reactions   Cymbalta [duloxetine Hcl] Other (See Comments)   Dizzy        Medication List     TAKE these medications    apixaban  5 MG Tabs tablet Commonly known as: Eliquis  Take 1 tablet (5 mg total) by mouth 2 (two) times daily.   carboxymethylcellulose 1 % ophthalmic solution  Place 1 drop into both eyes 2 (two) times daily as needed (dry/irrtated eyes).   cetirizine 10 MG tablet Commonly known as: ZYRTEC Take 10 mg by mouth daily as needed for allergies.   fluticasone 50 MCG/ACT nasal spray Commonly known as: FLONASE Place 2 sprays into both nostrils daily as needed for allergies or rhinitis.   LORazepam  1 MG tablet Commonly known as: ATIVAN  Take 0.5-1 mg by mouth 2 (two) times daily as needed for anxiety.   metoprolol  succinate 50 MG 24 hr  tablet Commonly known as: TOPROL -XL TAKE 1 TABLET BY MOUTH EVERY DAY WITH OR IMMEDIATELY FOLLOWING A MEAL   sertraline 100 MG tablet Commonly known as: ZOLOFT Take 150 mg by mouth in the morning.   simvastatin 20 MG tablet Commonly known as: ZOCOR Take 20 mg by mouth at bedtime.   traZODone  100 MG tablet Commonly known as: DESYREL  Take 100 mg by mouth at bedtime.        Disposition:  Home with usual follow up as in AVS  Duration of Discharge Encounter:  APP Time  10 minutes  Signed, Debbie Fails, PA-C  02/28/2024 4:23 PM

## 2024-02-28 NOTE — Anesthesia Procedure Notes (Signed)
 Procedure Name: Intubation Date/Time: 02/28/2024 10:17 AM  Performed by: Emmitt Harp, CRNAPre-anesthesia Checklist: Patient identified, Emergency Drugs available, Suction available and Patient being monitored Patient Re-evaluated:Patient Re-evaluated prior to induction Oxygen Delivery Method: Circle System Utilized Preoxygenation: Pre-oxygenation with 100% oxygen Induction Type: IV induction Ventilation: Mask ventilation without difficulty Laryngoscope Size: Mac and 3 Grade View: Grade II Tube type: Oral Tube size: 7.0 mm Number of attempts: 1 Airway Equipment and Method: Stylet and Oral airway Placement Confirmation: ETT inserted through vocal cords under direct vision, positive ETCO2 and breath sounds checked- equal and bilateral Secured at: 23 cm Tube secured with: Tape Dental Injury: Teeth and Oropharynx as per pre-operative assessment

## 2024-02-29 ENCOUNTER — Encounter (HOSPITAL_COMMUNITY): Payer: Self-pay | Admitting: Cardiology

## 2024-02-29 LAB — POCT ACTIVATED CLOTTING TIME: Activated Clotting Time: 342 s

## 2024-03-03 DIAGNOSIS — I480 Paroxysmal atrial fibrillation: Secondary | ICD-10-CM | POA: Diagnosis not present

## 2024-03-03 LAB — BASIC METABOLIC PANEL WITH GFR
BUN/Creatinine Ratio: 12 (ref 12–28)
BUN: 10 mg/dL (ref 8–27)
CO2: 20 mmol/L (ref 20–29)
Calcium: 8.9 mg/dL (ref 8.7–10.3)
Chloride: 104 mmol/L (ref 96–106)
Creatinine, Ser: 0.81 mg/dL (ref 0.57–1.00)
Glucose: 148 mg/dL — ABNORMAL HIGH (ref 70–99)
Potassium: 3.5 mmol/L (ref 3.5–5.2)
Sodium: 137 mmol/L (ref 134–144)
eGFR: 78 mL/min/{1.73_m2} (ref >59)

## 2024-03-04 LAB — CBC WITH DIFFERENTIAL/PLATELET
Basophils Absolute: 0 10*3/uL (ref 0.0–0.2)
Basos: 0 %
EOS (ABSOLUTE): 0.1 10*3/uL (ref 0.0–0.4)
Eos: 1 %
Hematocrit: 26.3 % — ABNORMAL LOW (ref 34.0–46.6)
Hemoglobin: 8.5 g/dL — ABNORMAL LOW (ref 11.1–15.9)
Immature Grans (Abs): 0 10*3/uL (ref 0.0–0.1)
Immature Granulocytes: 0 %
Lymphocytes Absolute: 1.6 10*3/uL (ref 0.7–3.1)
Lymphs: 18 %
MCH: 35 pg — ABNORMAL HIGH (ref 26.6–33.0)
MCHC: 32.3 g/dL (ref 31.5–35.7)
MCV: 108 fL — ABNORMAL HIGH (ref 79–97)
Monocytes Absolute: 0.5 10*3/uL (ref 0.1–0.9)
Monocytes: 6 %
Neutrophils Absolute: 6.8 10*3/uL (ref 1.4–7.0)
Neutrophils: 75 %
Platelets: 180 10*3/uL (ref 150–450)
RBC: 2.43 x10E6/uL — CL (ref 3.77–5.28)
RDW: 12.2 % (ref 11.7–15.4)
WBC: 9.1 10*3/uL (ref 3.4–10.8)

## 2024-03-05 ENCOUNTER — Encounter: Payer: Self-pay | Admitting: Cardiology

## 2024-03-05 ENCOUNTER — Inpatient Hospital Stay (HOSPITAL_COMMUNITY)
Admission: EM | Admit: 2024-03-05 | Discharge: 2024-03-08 | DRG: 920 | Disposition: A | Discharge status: Home / Self Care | Attending: Internal Medicine | Admitting: Internal Medicine

## 2024-03-05 ENCOUNTER — Other Ambulatory Visit: Payer: Self-pay

## 2024-03-05 ENCOUNTER — Emergency Department (HOSPITAL_COMMUNITY)

## 2024-03-05 ENCOUNTER — Telehealth: Payer: Self-pay | Admitting: Cardiology

## 2024-03-05 ENCOUNTER — Encounter (HOSPITAL_COMMUNITY): Payer: Self-pay | Admitting: Emergency Medicine

## 2024-03-05 DIAGNOSIS — D62 Acute posthemorrhagic anemia: Secondary | ICD-10-CM | POA: Diagnosis present

## 2024-03-05 DIAGNOSIS — K91840 Postprocedural hemorrhage and hematoma of a digestive system organ or structure following a digestive system procedure: Secondary | ICD-10-CM | POA: Diagnosis not present

## 2024-03-05 DIAGNOSIS — G8929 Other chronic pain: Secondary | ICD-10-CM | POA: Diagnosis present

## 2024-03-05 DIAGNOSIS — Z860100 Personal history of colon polyps, unspecified: Secondary | ICD-10-CM

## 2024-03-05 DIAGNOSIS — K5792 Diverticulitis of intestine, part unspecified, without perforation or abscess without bleeding: Secondary | ICD-10-CM | POA: Diagnosis not present

## 2024-03-05 DIAGNOSIS — F419 Anxiety disorder, unspecified: Secondary | ICD-10-CM | POA: Diagnosis present

## 2024-03-05 DIAGNOSIS — I10 Essential (primary) hypertension: Secondary | ICD-10-CM | POA: Diagnosis present

## 2024-03-05 DIAGNOSIS — D649 Anemia, unspecified: Secondary | ICD-10-CM | POA: Diagnosis not present

## 2024-03-05 DIAGNOSIS — K5732 Diverticulitis of large intestine without perforation or abscess without bleeding: Secondary | ICD-10-CM | POA: Diagnosis present

## 2024-03-05 DIAGNOSIS — G4733 Obstructive sleep apnea (adult) (pediatric): Secondary | ICD-10-CM | POA: Diagnosis not present

## 2024-03-05 DIAGNOSIS — Z888 Allergy status to other drugs, medicaments and biological substances status: Secondary | ICD-10-CM

## 2024-03-05 DIAGNOSIS — D539 Nutritional anemia, unspecified: Secondary | ICD-10-CM | POA: Diagnosis present

## 2024-03-05 DIAGNOSIS — K222 Esophageal obstruction: Secondary | ICD-10-CM | POA: Diagnosis present

## 2024-03-05 DIAGNOSIS — K449 Diaphragmatic hernia without obstruction or gangrene: Secondary | ICD-10-CM | POA: Diagnosis present

## 2024-03-05 DIAGNOSIS — Z95818 Presence of other cardiac implants and grafts: Secondary | ICD-10-CM | POA: Diagnosis not present

## 2024-03-05 DIAGNOSIS — D7589 Other specified diseases of blood and blood-forming organs: Secondary | ICD-10-CM | POA: Diagnosis present

## 2024-03-05 DIAGNOSIS — E7800 Pure hypercholesterolemia, unspecified: Secondary | ICD-10-CM | POA: Diagnosis not present

## 2024-03-05 DIAGNOSIS — F418 Other specified anxiety disorders: Secondary | ICD-10-CM | POA: Insufficient documentation

## 2024-03-05 DIAGNOSIS — K922 Gastrointestinal hemorrhage, unspecified: Secondary | ICD-10-CM | POA: Diagnosis present

## 2024-03-05 DIAGNOSIS — F17210 Nicotine dependence, cigarettes, uncomplicated: Secondary | ICD-10-CM | POA: Diagnosis present

## 2024-03-05 DIAGNOSIS — S1121XA Laceration without foreign body of pharynx and cervical esophagus, initial encounter: Secondary | ICD-10-CM

## 2024-03-05 DIAGNOSIS — K649 Unspecified hemorrhoids: Secondary | ICD-10-CM | POA: Diagnosis present

## 2024-03-05 DIAGNOSIS — K921 Melena: Secondary | ICD-10-CM

## 2024-03-05 DIAGNOSIS — Z7901 Long term (current) use of anticoagulants: Secondary | ICD-10-CM

## 2024-03-05 DIAGNOSIS — Z8619 Personal history of other infectious and parasitic diseases: Secondary | ICD-10-CM

## 2024-03-05 DIAGNOSIS — Z79899 Other long term (current) drug therapy: Secondary | ICD-10-CM

## 2024-03-05 DIAGNOSIS — Z8249 Family history of ischemic heart disease and other diseases of the circulatory system: Secondary | ICD-10-CM

## 2024-03-05 DIAGNOSIS — R109 Unspecified abdominal pain: Secondary | ICD-10-CM | POA: Diagnosis not present

## 2024-03-05 DIAGNOSIS — I480 Paroxysmal atrial fibrillation: Secondary | ICD-10-CM | POA: Diagnosis present

## 2024-03-05 DIAGNOSIS — Z720 Tobacco use: Secondary | ICD-10-CM | POA: Diagnosis present

## 2024-03-05 DIAGNOSIS — Y838 Other surgical procedures as the cause of abnormal reaction of the patient, or of later complication, without mention of misadventure at the time of the procedure: Secondary | ICD-10-CM | POA: Diagnosis present

## 2024-03-05 DIAGNOSIS — F32A Depression, unspecified: Secondary | ICD-10-CM | POA: Diagnosis present

## 2024-03-05 DIAGNOSIS — Z604 Social exclusion and rejection: Secondary | ICD-10-CM | POA: Diagnosis present

## 2024-03-05 LAB — CBC
HCT: 24.7 % — ABNORMAL LOW (ref 36.0–46.0)
Hemoglobin: 8 g/dL — ABNORMAL LOW (ref 12.0–15.0)
MCH: 35.1 pg — ABNORMAL HIGH (ref 26.0–34.0)
MCHC: 32.4 g/dL (ref 30.0–36.0)
MCV: 108.3 fL — ABNORMAL HIGH (ref 80.0–100.0)
Platelets: 212 10*3/uL (ref 150–400)
RBC: 2.28 MIL/uL — ABNORMAL LOW (ref 3.87–5.11)
RDW: 14.2 % (ref 11.5–15.5)
WBC: 10 10*3/uL (ref 4.0–10.5)
nRBC: 0 % (ref 0.0–0.2)

## 2024-03-05 LAB — COMPREHENSIVE METABOLIC PANEL WITH GFR
ALT: 16 U/L (ref 0–44)
AST: 25 U/L (ref 15–41)
Albumin: 3.3 g/dL — ABNORMAL LOW (ref 3.5–5.0)
Alkaline Phosphatase: 55 U/L (ref 38–126)
Anion gap: 12 (ref 5–15)
BUN: 7 mg/dL — ABNORMAL LOW (ref 8–23)
CO2: 20 mmol/L — ABNORMAL LOW (ref 22–32)
Calcium: 9 mg/dL (ref 8.9–10.3)
Chloride: 103 mmol/L (ref 98–111)
Creatinine, Ser: 0.71 mg/dL (ref 0.44–1.00)
GFR, Estimated: >60 mL/min (ref >60)
Glucose, Bld: 121 mg/dL — ABNORMAL HIGH (ref 70–99)
Potassium: 3.2 mmol/L — ABNORMAL LOW (ref 3.5–5.1)
Sodium: 135 mmol/L (ref 135–145)
Total Bilirubin: 0.4 mg/dL (ref 0.0–1.2)
Total Protein: 6.5 g/dL (ref 6.5–8.1)

## 2024-03-05 LAB — CBC WITH DIFFERENTIAL/PLATELET
Abs Immature Granulocytes: 0.1 10*3/uL — ABNORMAL HIGH (ref 0.00–0.07)
Basophils Absolute: 0.1 10*3/uL (ref 0.0–0.1)
Basophils Relative: 1 %
Eosinophils Absolute: 0.1 10*3/uL (ref 0.0–0.5)
Eosinophils Relative: 1 %
HCT: 25.7 % — ABNORMAL LOW (ref 36.0–46.0)
Hemoglobin: 8.2 g/dL — ABNORMAL LOW (ref 12.0–15.0)
Immature Granulocytes: 1 %
Lymphocytes Relative: 26 %
Lymphs Abs: 2.4 10*3/uL (ref 0.7–4.0)
MCH: 34.9 pg — ABNORMAL HIGH (ref 26.0–34.0)
MCHC: 31.9 g/dL (ref 30.0–36.0)
MCV: 109.4 fL — ABNORMAL HIGH (ref 80.0–100.0)
Monocytes Absolute: 0.7 10*3/uL (ref 0.1–1.0)
Monocytes Relative: 8 %
Neutro Abs: 5.9 10*3/uL (ref 1.7–7.7)
Neutrophils Relative %: 63 %
Platelets: 231 10*3/uL (ref 150–400)
RBC: 2.35 MIL/uL — ABNORMAL LOW (ref 3.87–5.11)
RDW: 14.1 % (ref 11.5–15.5)
WBC: 9.3 10*3/uL (ref 4.0–10.5)
nRBC: 0 % (ref 0.0–0.2)

## 2024-03-05 LAB — POC OCCULT BLOOD, ED: Fecal Occult Bld: NEGATIVE

## 2024-03-05 LAB — PROTIME-INR
INR: 1.3 — ABNORMAL HIGH (ref 0.8–1.2)
Prothrombin Time: 16.1 s — ABNORMAL HIGH (ref 11.4–15.2)

## 2024-03-05 MED ORDER — LACTATED RINGERS IV SOLN: INTRAVENOUS | Status: AC

## 2024-03-05 MED ORDER — SODIUM CHLORIDE 0.9 % IV SOLN
1.0000 g | Freq: Once | INTRAVENOUS | Status: AC
  Administered 2024-03-05: 1 g via INTRAVENOUS
  Filled 2024-03-05: qty 10

## 2024-03-05 MED ORDER — SODIUM CHLORIDE 0.9 % IV SOLN
2.0000 g | INTRAVENOUS | Status: DC
  Administered 2024-03-06 – 2024-03-08 (×3): 2 g via INTRAVENOUS
  Filled 2024-03-05 (×3): qty 20

## 2024-03-05 MED ORDER — METRONIDAZOLE 500 MG/100ML IV SOLN
500.0000 mg | Freq: Two times a day (BID) | INTRAVENOUS | Status: DC
  Administered 2024-03-06 – 2024-03-08 (×5): 500 mg via INTRAVENOUS
  Filled 2024-03-05 (×5): qty 100

## 2024-03-05 MED ORDER — PANTOPRAZOLE SODIUM 40 MG IV SOLR
40.0000 mg | Freq: Once | INTRAVENOUS | Status: AC
  Administered 2024-03-05: 40 mg via INTRAVENOUS
  Filled 2024-03-05: qty 10

## 2024-03-05 MED ORDER — IOHEXOL 350 MG/ML SOLN
75.0000 mL | Freq: Once | INTRAVENOUS | Status: AC | PRN
Start: 2024-03-05 — End: 2024-03-05
  Administered 2024-03-05: 75 mL via INTRAVENOUS

## 2024-03-05 MED ORDER — METRONIDAZOLE 500 MG/100ML IV SOLN
500.0000 mg | Freq: Once | INTRAVENOUS | Status: AC
  Administered 2024-03-05: 500 mg via INTRAVENOUS

## 2024-03-05 MED ORDER — PANTOPRAZOLE SODIUM 40 MG IV SOLR
40.0000 mg | Freq: Four times a day (QID) | INTRAVENOUS | Status: DC
  Administered 2024-03-06 – 2024-03-07 (×6): 40 mg via INTRAVENOUS
  Filled 2024-03-05 (×7): qty 10

## 2024-03-05 NOTE — ED Notes (Signed)
Shift report received, assumed care of patient at this time 

## 2024-03-05 NOTE — ED Notes (Signed)
 Notified remote tele that patient had been moved to ER 42

## 2024-03-05 NOTE — ED Provider Notes (Signed)
 [place] EMERGENCY DEPARTMENT AT [facility] Provider Note   CSN: [id number] Arrival date & time: 03/05/24  1725     History  Chief Complaint  Patient presents with   Abnormal Labs    Theresa Reynolds is a 73 y.o. female.  Patient is a 73 year old female who presents with low blood counts.  She had a Watchman procedure done on April 24.  She is on Eliquis .  She said when she went home after her procedure, she started having black tarry stools.  She said it was large amounts.  She had some diffuse abdominal pain.  She had some nausea but no vomiting.  She says her stools have gone back to brown and her abdominal pain has improved.  She denies any fevers.  She feels very fatigued.  She had some blood work checked recently and was found to have significant drop in her hemoglobin.  Was sent here for further evaluation.  She denies any shortness of breath.  She has had prior bloody stools in the past related to hemorrhoids.  She has had banding of hemorrhoids.  She has also had a history of polyps.  She is followed by [facility] medical gastroenterology.  She has never had black tarry stools.        Home Medications Prior to Admission medications   Medication Sig Start Date End Date Taking? Authorizing Provider  apixaban  (ELIQUIS ) 5 MG TABS tablet Take 1 tablet (5 mg total) by mouth 2 (two) times daily. 01/21/24 04/20/24  Boyce Byes, MD  carboxymethylcellulose 1 % ophthalmic solution Place 1 drop into both eyes 2 (two) times daily as needed (dry/irrtated eyes).    [provider]  cetirizine (ZYRTEC) 10 MG tablet Take 10 mg by mouth daily as needed for allergies.     [provider]  fluticasone (FLONASE) 50 MCG/ACT nasal spray Place 2 sprays into both nostrils daily as needed for allergies or rhinitis.    [provider]  LORazepam  (ATIVAN ) 1 MG tablet Take 0.5-1 mg by mouth 2 (two) times daily as needed for anxiety.    [provider]   metoprolol  succinate (TOPROL -XL) 50 MG 24 hr tablet TAKE 1 TABLET BY MOUTH EVERY DAY WITH OR IMMEDIATELY FOLLOWING A MEAL 12/25/23   Boyce Byes, MD  sertraline (ZOLOFT) 100 MG tablet Take 150 mg by mouth in the morning. 10/18/17   [provider]  simvastatin (ZOCOR) 20 MG tablet Take 20 mg by mouth at bedtime.     [provider]  traZODone  (DESYREL ) 100 MG tablet Take 100 mg by mouth at bedtime.    [provider]      Allergies    Cymbalta [duloxetine hcl]    Review of Systems   Review of Systems  Constitutional:  Positive for fatigue. Negative for chills, diaphoresis and fever.  HENT:  Negative for congestion, rhinorrhea and sneezing.   Eyes: Negative.   Respiratory:  Negative for cough, chest tightness and shortness of breath.   Cardiovascular:  Negative for chest pain and leg swelling.  Gastrointestinal:  Positive for abdominal pain, blood in stool and nausea. Negative for diarrhea and vomiting.  Genitourinary:  Negative for difficulty urinating, flank pain, frequency and hematuria.  Musculoskeletal:  Negative for arthralgias and back pain.  Skin:  Negative for rash.  Neurological:  Negative for dizziness, speech difficulty, weakness, numbness and headaches.    Physical Exam Updated Vital Signs BP (!) 108/47 (BP Location: Left Arm)  Pulse 68   Temp 97.7 F (36.5 C) (Axillary)   Resp 16   Ht 5\' 1"  (1.549 m)   Wt 71 kg   SpO2 97%   BMI 29.58 kg/m  Physical Exam Constitutional:      Appearance: She is well-developed.  HENT:     Head: Normocephalic and atraumatic.  Eyes:     Pupils: Pupils are equal, round, and reactive to light.  Cardiovascular:     Rate and Rhythm: Normal rate and regular rhythm.     Heart sounds: Normal heart sounds.  Pulmonary:     Effort: Pulmonary effort is normal. No respiratory distress.     Breath sounds: Normal breath sounds. No wheezing or rales.  Chest:     Chest wall: No tenderness.  Abdominal:      General: Bowel sounds are normal.     Palpations: Abdomen is soft.     Tenderness: There is no abdominal tenderness. There is no guarding or rebound.  Musculoskeletal:        General: Normal range of motion.     Cervical back: Normal range of motion and neck supple.  Lymphadenopathy:     Cervical: No cervical adenopathy.  Skin:    General: Skin is warm and dry.     Findings: No rash.  Neurological:     Mental Status: She is alert and oriented to person, place, and time.     ED Results / Procedures / Treatments   Labs (all labs ordered are listed, but only abnormal results are displayed) Labs Reviewed  COMPREHENSIVE METABOLIC PANEL WITH GFR - Abnormal; Notable for the following components:      Result Value   Potassium 3.2 (*)    CO2 20 (*)    Glucose, Bld 121 (*)    BUN 7 (*)    Albumin 3.3 (*)    All other components within normal limits  CBC WITH DIFFERENTIAL/PLATELET - Abnormal; Notable for the following components:   RBC 2.35 (*)    Hemoglobin 8.2 (*)    HCT 25.7 (*)    MCV 109.4 (*)    MCH 34.9 (*)    Abs Immature Granulocytes 0.10 (*)    All other components within normal limits  PROTIME-INR - Abnormal; Notable for the following components:   Prothrombin Time 16.1 (*)    INR 1.3 (*)    All other components within normal limits  CBC  POC OCCULT BLOOD, ED  TYPE AND SCREEN    EKG None  Radiology CT ABDOMEN PELVIS W CONTRAST Result Date: 03/05/2024 CLINICAL DATA:  Abdominal pain EXAM: CT ABDOMEN AND PELVIS WITH CONTRAST TECHNIQUE: Multidetector CT imaging of the abdomen and pelvis was performed using the standard protocol following bolus administration of intravenous contrast. RADIATION DOSE REDUCTION: This exam was performed according to the departmental dose-optimization program which includes automated exposure control, adjustment of the mA and/or kV according to patient size and/or use of iterative reconstruction technique. CONTRAST:  75mL OMNIPAQUE  IOHEXOL  350  MG/ML SOLN COMPARISON:  None Available. FINDINGS: Lower chest: No acute pleural or parenchymal lung disease. Hepatobiliary: No focal liver abnormality is seen. No gallstones, gallbladder wall thickening, or biliary dilatation. Pancreas: Unremarkable. No pancreatic ductal dilatation or surrounding inflammatory changes. Spleen: Normal in size without focal abnormality. Adrenals/Urinary Tract: Kidneys are unremarkable without urinary tract calculi or obstructive uropathy. The adrenals and bladder are normal. Stomach/Bowel: No bowel obstruction or ileus. Diverticulosis of the sigmoid colon, with segmental wall thickening and pericolonic fat stranding consistent  with acute diverticulitis. No perforation, fluid collection, or abscess. Normal appendix right lower quadrant. Vascular/Lymphatic: Aortic atherosclerosis. No enlarged abdominal or pelvic lymph nodes. Reproductive: Uterus and bilateral adnexa are unremarkable. Other: No free fluid or free intraperitoneal gas. No abdominal wall hernia. Musculoskeletal: No acute or destructive bony abnormalities. Reconstructed images demonstrate no additional findings. IMPRESSION: 1. Acute uncomplicated sigmoid diverticulitis. No perforation, fluid collection, or abscess. 2.  Aortic Atherosclerosis (ICD10-I70.0). Electronically Signed   By: Bobbye Burrow M.D.   On: 03/05/2024 21:31    Procedures Procedures    Medications Ordered in ED Medications  cefTRIAXone (ROCEPHIN) 1 g in sodium chloride  0.9 % 100 mL IVPB (1 g Intravenous New Bag/Given 03/05/24 2200)  metroNIDAZOLE (FLAGYL) IVPB 500 mg (500 mg Intravenous New Bag/Given 03/05/24 2158)  pantoprazole  (PROTONIX ) injection 40 mg (40 mg Intravenous Given 03/05/24 2046)  iohexol  (OMNIPAQUE ) 350 MG/ML injection 75 mL (75 mLs Intravenous Contrast Given 03/05/24 2123)    ED Course/ Medical Decision Making/ A&P                                 Medical Decision Making Amount and/or Complexity of Data Reviewed Radiology:  ordered.  Risk Prescription drug management. Decision regarding hospitalization.   Patient presents with anemia.  She has had a drop in her hemoglobin from around 14 a month ago to 8.2 today.  She also has some abdominal pain.  CT scan shows evidence of diverticulitis without complicating features.  She had what sounded like melena earlier in the week and but has now turned to brown stools.  On rectal exam she had brown stool, no melena or bright red blood.  He was Hemoccult negative.  She is on Eliquis .  Started on antibiotics for her diverticulitis.  Type and screen was performed but have not ordered blood yet.  Discussed with Dr. [facility] Lavender who will admit the patient for further treatment.  CRITICAL CARE Performed by: Hershel Los Total critical care time: 60 minutes Critical care time was exclusive of separately billable procedures and treating other patients. Critical care was necessary to treat or prevent imminent or life-threatening deterioration. Critical care was time spent personally by me on the following activities: development of treatment plan with patient and/or surrogate as well as nursing, discussions with consultants, evaluation of patient's response to treatment, examination of patient, obtaining history from patient or surrogate, ordering and performing treatments and interventions, ordering and review of laboratory studies, ordering and review of radiographic studies, pulse oximetry and re-evaluation of patient's condition.   Final Clinical Impression(s) / ED Diagnoses Final diagnoses:  Anemia, unspecified type  Diverticulitis    Rx / DC Orders ED Discharge Orders     None         Hershel Los, MD 03/05/24 2204

## 2024-03-05 NOTE — Consult Note (Signed)
 Cardiology Consultation   Patient ID: Theresa Reynolds MRN: [id number]; DOB: [birth year]  Admit date: 03/05/2024 Date of Consult: 03/05/2024  PCP:  Alejandro Hurt, FNP   [facility] Providers Cardiologist:  None  Electrophysiologist:  Boyce Byes, MD       Patient Profile:   Theresa Reynolds is a 73 y.o. female with a hx of AFib and recent Watchman device implantation who is being seen 03/05/2024 for the evaluation of acute GI bleeding at the request of Dr. Nolia Baumgartner.  History of Present Illness:   [name] has a longstanding history of paroxysmal atrial fibrillation and a previous ablation, on chronic anticoagulation but with concerns for recurrent GI bleeding risk since she requires frequent colonic polypectomies and has recently had unprovoked nosebleeds.  She therefore underwent implantation of a Watchman device 02/28/2024.  The procedure itself was uncomplicated, but immediately following the procedure she had problems with epigastric discomfort and anorexia.  After returning home from the [facility] procedure she began experiencing black tarry diarrhea for the next roughly 48 hours, leading into Saturday.  Her stools have subsequently gradually returned almost back to normal.  They are now more solid and are brown.  Labs drawn on Monday 03/03/2024 and resulted 03/04/2024 showed that the hemoglobin dropped from 14.8 before the procedure to 8.5.  Repeat blood labs performed in the emergency room show virtually no change with a hemoglobin down to 8.2.  She actually has chronically macrocytic indices, but previously without anemia.  Of note, on the day of the procedure, after receiving standard anticoagulation with intravenous heparin  she received an initial dose of Eliquis  around 2 PM and took a second dose of Eliquis  at 11 PM.  She has had a previous history of hemorrhoidal bleeding for which she underwent banding.  She does not have a history of peptic ulcer disease,  esophageal reflux or varices or other sources of upper GI bleeding.  She has had numerous colonoscopies, but has not required endoscopy in the past.  She carefully avoids NSAIDs.  She has well treated hypertension, hypercholesterolemia and mild bilateral carotid stenosis.  She is an ongoing smoker.   Past Medical History:  Diagnosis Date   Anxiety and depression    s/p suicide attempt 11/11   Aortic insufficiency    mild by echo 10/17   Atrial fibrillation (HCC)    Carotid stenosis, bilateral    0-39% by US    Colitis    Hepatitis C    Followed by Dr. Margarete Sharps at [facility].  s/p treatment with Bavoni.   History of shingles    HTN (hypertension)    Hypercholesteremia    Hypersomnia    OSA (obstructive sleep apnea)    Right shoulder pain    Seasonal allergies    Syncope and collapse    Tobacco use    Vitamin D deficiency     Past Surgical History:  Procedure Laterality Date   ATRIAL FIBRILLATION ABLATION N/A 06/05/2017   Procedure: Atrial Fibrillation Ablation;  Surgeon: Jolly Needle, MD;  Location: [facility] INVASIVE CV LAB;  Service: Cardiovascular;  Laterality: N/A;   BREAST REDUCTION SURGERY  10/07/1999   With Dr. Lindle Rhea   CESAREAN SECTION     LEFT ATRIAL APPENDAGE OCCLUSION N/A 02/28/2024   Procedure: LEFT ATRIAL APPENDAGE OCCLUSION;  Surgeon: Boyce Byes, MD;  Location: [facility] INVASIVE CV LAB;  Service: Cardiovascular;  Laterality: N/A;   LOOP RECORDER INSERTION N/A 05/07/2017   Procedure: Loop Recorder Insertion;  Surgeon: Jolly Needle, MD;  Location: [facility] INVASIVE CV LAB;  Service: Cardiovascular;  Laterality: N/A;   LOOP RECORDER INSERTION  04/18/2021   removal of previously implanted [id number] with subsequent placement of a Medtronic Reveal Linq model LNQ 2 implantable loop recorder  (SN# [id number])   MENISCUS REPAIR Left 2023   TRANSESOPHAGEAL ECHOCARDIOGRAM (CATH LAB) N/A 02/28/2024   Procedure: TRANSESOPHAGEAL ECHOCARDIOGRAM;  Surgeon: Boyce Byes, MD;   Location: [facility] INVASIVE CV LAB;  Service: Cardiovascular;  Laterality: N/A;       Inpatient Medications: Scheduled Meds:  pantoprazole   40 mg Intravenous Once   Continuous Infusions:  PRN Meds:   Allergies:    Allergies  Allergen Reactions   Cymbalta [Duloxetine Hcl] Other (See Comments)    Dizzy    Social History:   Social History   Socioeconomic History   Marital status: Divorced    Spouse name: Not on file   Number of children: 2   Years of education: Not on file   Highest education level: Not on file  Occupational History   Not on file  Tobacco Use   Smoking status: Every Day    Current packs/day: 1.00    Average packs/day: 1 pack/day for 30.0 years (30.0 ttl pk-yrs)    Types: Cigarettes   Smokeless tobacco: Never  Vaping Use   Vaping status: Never Used  Substance and Sexual Activity   Alcohol use: Yes    Alcohol/week: 6.0 standard drinks of alcohol    Types: 6 Glasses of wine per week   Drug use: No   Sexual activity: Not on file  Other Topics Concern   Not on file  Social History Narrative   Not on file   Social Drivers of Health   Financial Resource Strain: Not on file  Food Insecurity: Not on file  Transportation Needs: Not on file  Physical Activity: Not on file  Stress: Not on file  Social Connections: Not on file  Intimate Partner Violence: Not on file    Family History:    Family History  Problem Relation Age of Onset   Atrial fibrillation Mother    Mitral valve prolapse Mother    Atrial fibrillation Brother    Heart disease Maternal Grandfather      ROS:  Please see the history of present illness.   All other ROS reviewed and negative.     Physical Exam/Data:   Vitals:   03/05/24 1739 03/05/24 1800  BP: (!) 110/45   Pulse: 65   Resp: 16   Temp: 98.3 F (36.8 C)   TempSrc: Oral   SpO2: 99%   Weight:  71 kg   No intake or output data in the 24 hours ending 03/05/24 2009    03/05/2024    6:00 PM 02/28/2024    6:54 AM  02/01/2024    7:58 AM  Last 3 Weights  Weight (lbs) 156 lb 8.4 oz 158 lb 158 lb 3.2 oz  Weight (kg) 71 kg 71.668 kg 71.759 kg     Body mass index is 29.58 kg/m.  General:  Well nourished, well developed, in no acute distress overweight, but not obese HEENT: normal Neck: no JVD Vascular: No carotid bruits; Distal pulses 2+ bilaterally Cardiac:  normal S1, S2; RRR; 2-3/6 aortic ejection murmur heard broadly throughout the precordium but loudest at the right upper sternal border, no diastolic murmur  Lungs:  clear to auscultation bilaterally, no wheezing, rhonchi or rales  Abd: soft, nontender, no hepatomegaly  Ext: no edema Musculoskeletal:  No deformities, BUE and BLE strength normal and equal Skin: warm and dry  Neuro:  CNs 2-12 intact, no focal abnormalities noted Psych:  Normal affect   EKG:  The 03/25/24 EKG was personally reviewed and demonstrates: Normal sinus rhythm with delayed anterior R wave progression Telemetry:  Telemetry was personally reviewed and demonstrates: Normal sinus rhythm  Relevant CV Studies: Watchman procedural TEE 25-Mar-2024    1. Large chicken wing appendage No thrombus. 40 mm FLX device somewhat  canted position with small initial butter along coumadin ridge. Negative  tugg test Color flow with no leak past fabric after device warmed and  released. Average compression 26%.   2. Left ventricular ejection fraction, by estimation, is 55 to 60%. The  left ventricle has normal function.   3. Right ventricular systolic function is normal. The right ventricular  size is normal.   4. Left atrial size was mildly dilated. No left atrial/left atrial  appendage thrombus was detected.   5. The mitral valve is abnormal. Mild mitral valve regurgitation.   6. The aortic valve is tricuspid. There is mild calcification of the  aortic valve. There is mild thickening of the aortic valve. Aortic valve  regurgitation is moderate. Mild aortic valve stenosis.   7. 3D  performed of the LAA and demonstrates Extensive 3 D image done to  measure device compression.   8. Only left to right shunt post trans septal puncture.   Laboratory Data:  High Sensitivity Troponin:  No results for input(s): "TROPONINIHS" in the last 720 hours.   Chemistry Recent Labs  Lab 03/03/24 0800 03/05/24 1807  NA 137 135  K 3.5 3.2*  CL 104 103  CO2 20 20*  GLUCOSE 148* 121*  BUN 10 7*  CREATININE 0.81 0.71  CALCIUM 8.9 9.0  GFRNONAA  --  >60  ANIONGAP  --  12    Recent Labs  Lab 03/05/24 1807  PROT 6.5  ALBUMIN 3.3*  AST 25  ALT 16  ALKPHOS 55  BILITOT 0.4   Lipids No results for input(s): "CHOL", "TRIG", "HDL", "LABVLDL", "LDLCALC", "CHOLHDL" in the last 168 hours.  Hematology Recent Labs  Lab 03/03/24 0800 03/05/24 1807  WBC 9.1 9.3  RBC 2.43* 2.35*  HGB 8.5* 8.2*  HCT 26.3* 25.7*  MCV 108* 109.4*  MCH 35.0* 34.9*  MCHC 32.3 31.9  RDW 12.2 14.1  PLT 180 231   Thyroid  No results for input(s): "TSH", "FREET4" in the last 168 hours.  BNPNo results for input(s): "BNP", "PROBNP" in the last 168 hours.  DDimer No results for input(s): "DDIMER" in the last 168 hours.   Radiology/Studies:  No results found.   Assessment and Plan:   Acute GI bleeding: Upper GI source manifesting as melena.  No prior history of GI symptomatology or known disease.  It appears that the active has stopped.  May have been precipitated by intravenous heparin  and 2 doses of Eliquis  they are closer together than usual.  Stools are now brown and the hemoglobin appears to be stable.  Will plan to hold her Eliquis  overnight and recheck hemoglobin in the morning.  If the values are still stable we probably should resume her Eliquis .  If there is evidence of ongoing bleeding overnight, would ask GI for an upper endoscopy.  Until we clarify whether or not she will need endoscopy would keep her n.p.o.  Also note that the sertraline may slightly increase her bleeding risk due to its  antiplatelet effect.  However this appears to be a necessary medication. AFib s/p watchman: Unfortunately the 6 weeks following device implantation are a period of higher risk of thromboembolic events while the artificial device is exposed to the blood pool and before endothelialization.  Would prefer to avoid interruptions in anticoagulation lasting more than 24-48 hours.  Obviously, if bleeding restarts we will have to hold the Eliquis  and perform upper endoscopy. HTN: Blood pressure is well-controlled and she is not hypotensive.  There does not appear to be ongoing volume loss.  Continue metoprolol . HLP: On simvastatin.   Risk Assessment/Risk Scores:          CHA2DS2-VASc Score = 4   This indicates a 4.8% annual risk of stroke. The patient's score is based upon: CHF History: 0 HTN History: 1 Diabetes History: 0 Stroke History: 0 Vascular Disease History: 1 Age Score: 1 Gender Score: 1         For questions or updates, please contact [facility] Please consult [email or web address] for contact info under    Signed, Luana Rumple, MD  03/05/2024 8:09 PM

## 2024-03-05 NOTE — ED Provider Triage Note (Signed)
 Emergency Medicine Provider Triage Evaluation Note  Theresa Reynolds , a 73 y.o. female  was evaluated in triage.  Pt complains of low hemoglobin.  Patient is on anticoagulation.  She reports having Watchman procedure performed on 4/24.  She had labs 2 days ago which demonstrated hemoglobin 8.5, down from 14.8 on 02/01/24.  She does report passage of black tarry stools.  She denies history of heavy NSAID use.  She drinks wine daily.  Denies history of peptic ulcer disease.  She has had some abdominal pain.  Review of Systems  Positive: Dark stool Negative: Syncope  Physical Exam  BP (!) 110/45 (BP Location: Left Arm)   Pulse 65   Temp 98.3 F (36.8 C) (Oral)   Resp 16   Wt 71 kg   SpO2 99%   BMI 29.58 kg/m  Gen:   Awake, no distress   Resp:  Normal effort  MSK:   Moves extremities without difficulty  Other:  Upper abdominal tenderness to palpation  Medical Decision Making  Medically screening exam initiated at 6:07 PM.  Appropriate orders placed.  Theresa Reynolds was informed that the remainder of the evaluation will be completed by another provider, this initial triage assessment does not replace that evaluation, and the importance of remaining in the ED until their evaluation is complete.  Patient is hemodynamically stable.  Will recheck lab work.     Lyna Sandhoff, PA-C 03/05/24 1610

## 2024-03-05 NOTE — Telephone Encounter (Signed)
 Patient with Hemoglobin 8.5 on Monday lab work, down from 12 pre-op.  I was able to reach the patient by phone. She reports  in the 24 hours after going home from the procedure, she developed abdominal pain, nausea and inability to tolerate PO. She then developed bright red blood per rectum. This then progressed to dark, tarry stools that lasted several days. She has felt weak. In the last 24 hours, her stools have returned to a brown color.  I asked her to report immediately to the emergency room and stay NPO given probable GI bleed. She expressed understanding and is heading there now.  I spoke with the cardiology team on call and they are aware she is going to the ER.  Donelda Fujita T. Marven Slimmer, MD, [facility], [facility] Cardiac Electrophysiology

## 2024-03-05 NOTE — ED Notes (Signed)
 Placed patient on cardiac monitor.

## 2024-03-05 NOTE — H&P (Addendum)
 History and Physical    [name]:[id number] DOB: 1951-05-06 DOA: 03/05/2024  Patient coming from: Home.  Chief Complaint: Low hemoglobin.  HPI: Theresa Reynolds is a 73 y.o. female with history of atrial fibrillation status post recent Watchman device placement, hypertension hyperlipidemia, depression anxiety, tobacco abuse was requested to come to the ER after patient's blood work done at the office showed low hemoglobin around 8.  Patient had recent Watchman device placement for known history of A-fib with prior recurrent GI bleed.  Patient states over the last 4 days she has been having frequent loose bowel movements which were black in color.  Denies taking any NSAIDs.  Patient's last dose of Eliquis  was this morning.  ED Course: In the ER patient's blood pressure is in the low normal.  Hemoglobin is around 8 which is a drop from 14 from last month.  On-call cardiologist was consulted to get further input about of the recent Watchman device placement and GI bleed.  GI has been consulted.  CT abdomen pelvis done in the ER did not show any active GI bleed but did show features concerning for sigmoid diverticulitis.  Patient started on IV Protonix  and admitted for acute GI bleed.  Patient also was placed on antibiotics for diverticulitis.  Review of Systems: As per HPI, rest all negative.   Past Medical History:  Diagnosis Date   Anxiety and depression    s/p suicide attempt 11/11   Aortic insufficiency    mild by echo 10/17   Atrial fibrillation (HCC)    Carotid stenosis, bilateral    0-39% by US    Colitis    Hepatitis C    Followed by Dr. Margarete Sharps at [facility].  s/p treatment with Bavoni.   History of shingles    HTN (hypertension)    Hypercholesteremia    Hypersomnia    OSA (obstructive sleep apnea)    Right shoulder pain    Seasonal allergies    Syncope and collapse    Tobacco use    Vitamin D deficiency     Past Surgical History:  Procedure Laterality  Date   ATRIAL FIBRILLATION ABLATION N/A 06/05/2017   Procedure: Atrial Fibrillation Ablation;  Surgeon: Jolly Needle, MD;  Location: [facility] INVASIVE CV LAB;  Service: Cardiovascular;  Laterality: N/A;   BREAST REDUCTION SURGERY  10/07/1999   With Dr. Lindle Rhea   CESAREAN SECTION     LEFT ATRIAL APPENDAGE OCCLUSION N/A 02/28/2024   Procedure: LEFT ATRIAL APPENDAGE OCCLUSION;  Surgeon: Boyce Byes, MD;  Location: [facility] INVASIVE CV [facility];  Service: Cardiovascular;  Laterality: N/A;   LOOP RECORDER INSERTION N/A 05/07/2017   Procedure: Loop Recorder Insertion;  Surgeon: Jolly Needle, MD;  Location: [facility] INVASIVE CV LAB;  Service: Cardiovascular;  Laterality: N/A;   LOOP RECORDER INSERTION  04/18/2021   removal of previously implanted [id number] with subsequent placement of a Medtronic Reveal Linq model LNQ 2 implantable loop recorder  (SN# [id number])   MENISCUS REPAIR Left 2023   TRANSESOPHAGEAL ECHOCARDIOGRAM (CATH LAB) N/A 02/28/2024   Procedure: TRANSESOPHAGEAL ECHOCARDIOGRAM;  Surgeon: Boyce Byes, MD;  Location: [facility] INVASIVE CV LAB;  Service: Cardiovascular;  Laterality: N/A;     reports that she has been smoking cigarettes. She has a 30 pack-year smoking history. She has never used smokeless tobacco. She reports current alcohol  use of about 6.0 standard drinks of alcohol  per week. She reports that she does not use drugs.  Allergies  Allergen Reactions   Cymbalta [  Duloxetine Hcl] Other (See Comments)    Dizzy    Family History  Problem Relation Age of Onset   Atrial fibrillation Mother    Mitral valve prolapse Mother    Atrial fibrillation Brother    Heart disease Maternal Grandfather     Prior to Admission medications   Medication Sig Start Date End Date Taking? Authorizing Provider  apixaban  (ELIQUIS ) 5 MG TABS tablet Take 1 tablet (5 mg total) by mouth 2 (two) times daily. 01/21/24 04/20/24 Yes Boyce Byes, MD  carboxymethylcellulose 1 % ophthalmic solution Place 1 drop  into both eyes 2 (two) times daily as needed (dry/irrtated eyes).   Yes [provider]  fluticasone (FLONASE) 50 MCG/ACT nasal spray Place 2 sprays into both nostrils daily as needed for allergies or rhinitis.   Yes [provider]  LORazepam  (ATIVAN ) 1 MG tablet Take 0.5-1 mg by mouth 2 (two) times daily as needed for anxiety.   Yes [provider]  metoprolol  succinate (TOPROL -XL) 50 MG 24 hr tablet TAKE 1 TABLET BY MOUTH EVERY DAY WITH OR IMMEDIATELY FOLLOWING A MEAL 12/25/23  Yes Boyce Byes, MD  sertraline  (ZOLOFT ) 100 MG tablet Take 150 mg by mouth daily. 10/18/17  Yes [provider]  simvastatin  (ZOCOR ) 20 MG tablet Take 20 mg by mouth at bedtime.    Yes [provider]  traZODone  (DESYREL ) 100 MG tablet Take 100 mg by mouth at bedtime.   Yes [provider]    Physical Exam: Constitutional: Moderately built and nourished. Vitals:   03/05/24 2136 03/05/24 2144 03/05/24 2146 03/05/24 2146  BP: (!) 107/36 (!) 108/47    Pulse: 67 68    Resp: 19 16    Temp:  97.7 F (36.5 C)    TempSrc:  Axillary    SpO2: 100% 97%  97%  Weight:   71 kg   Height:   5\' 1"  (1.549 m)    Eyes: Anicteric no pallor. ENMT: No discharge from the ears/nose or mouth. Neck: No mass felt.  No neck rigidity. Respiratory: No rhonchi or crepitations. Cardiovascular: S1-S2 heard. Abdomen: Soft nontender bowel sound present. Musculoskeletal: No edema. Skin: No rash. Neurologic: Alert awake oriented to time place and person.  Moves all extremities. Psychiatric: Appears normal.  Normal affect.   Labs on Admission: I have personally reviewed following labs and imaging studies  CBC: Recent Labs  Lab 03/03/24 0800 03/05/24 1807 03/05/24 2235  WBC 9.1 9.3 10.0  NEUTROABS 6.8 5.9  --   HGB 8.5* 8.2* 8.0*  HCT 26.3* 25.7* 24.7*  MCV 108* 109.4* 108.3*  PLT 180 231 212   Basic Metabolic Panel: Recent Labs  Lab 03/03/24 0800 03/05/24 1807  NA  137 135  K 3.5 3.2*  CL 104 103  CO2 20 20*  GLUCOSE 148* 121*  BUN 10 7*  CREATININE 0.81 0.71  CALCIUM 8.9 9.0   GFR: Estimated Creatinine Clearance: 58.1 mL/min (by C-G formula based on SCr of 0.71 mg/dL). Liver Function Tests: Recent Labs  Lab 03/05/24 1807  AST 25  ALT 16  ALKPHOS 55  BILITOT 0.4  PROT 6.5  ALBUMIN 3.3*   No results for input(s): "LIPASE", "AMYLASE" in the last 168 hours. No results for input(s): "AMMONIA" in the last 168 hours. Coagulation Profile: Recent Labs  Lab 03/05/24 1807  INR 1.3*   Cardiac Enzymes: No results for input(s): "CKTOTAL", "CKMB", "CKMBINDEX", "TROPONINI" in the last 168 hours. BNP (last 3 results) No results for input(s): "  PROBNP" in the last 8760 hours. HbA1C: No results for input(s): "HGBA1C" in the last 72 hours. CBG: No results for input(s): "GLUCAP" in the last 168 hours. Lipid Profile: No results for input(s): "CHOL", "HDL", "LDLCALC", "TRIG", "CHOLHDL", "LDLDIRECT" in the last 72 hours. Thyroid  Function Tests: No results for input(s): "TSH", "T4TOTAL", "FREET4", "T3FREE", "THYROIDAB" in the last 72 hours. Anemia Panel: No results for input(s): "VITAMINB12", "FOLATE", "FERRITIN", "TIBC", "IRON", "RETICCTPCT" in the last 72 hours. Urine analysis: No results found for: "COLORURINE", "APPEARANCEUR", "LABSPEC", "PHURINE", "GLUCOSEU", "HGBUR", "BILIRUBINUR", "KETONESUR", "PROTEINUR", "UROBILINOGEN", "NITRITE", "LEUKOCYTESUR" Sepsis Labs: @LABRCNTIP (procalcitonin:4,lacticidven:4) )No results found for this or any previous visit (from the past 240 hours).   Radiological Exams on Admission: CT ABDOMEN PELVIS W CONTRAST Result Date: 03/05/2024 CLINICAL DATA:  Abdominal pain EXAM: CT ABDOMEN AND PELVIS WITH CONTRAST TECHNIQUE: Multidetector CT imaging of the abdomen and pelvis was performed using the standard protocol following bolus administration of intravenous contrast. RADIATION DOSE REDUCTION: This exam was performed  according to the departmental dose-optimization program which includes automated exposure control, adjustment of the mA and/or kV according to patient size and/or use of iterative reconstruction technique. CONTRAST:  75mL OMNIPAQUE  IOHEXOL  350 MG/ML SOLN COMPARISON:  None Available. FINDINGS: Lower chest: No acute pleural or parenchymal lung disease. Hepatobiliary: No focal liver abnormality is seen. No gallstones, gallbladder wall thickening, or biliary dilatation. Pancreas: Unremarkable. No pancreatic ductal dilatation or surrounding inflammatory changes. Spleen: Normal in size without focal abnormality. Adrenals/Urinary Tract: Kidneys are unremarkable without urinary tract calculi or obstructive uropathy. The adrenals and bladder are normal. Stomach/Bowel: No bowel obstruction or ileus. Diverticulosis of the sigmoid colon, with segmental wall thickening and pericolonic fat stranding consistent with acute diverticulitis. No perforation, fluid collection, or abscess. Normal appendix right lower quadrant. Vascular/Lymphatic: Aortic atherosclerosis. No enlarged abdominal or pelvic lymph nodes. Reproductive: Uterus and bilateral adnexa are unremarkable. Other: No free fluid or free intraperitoneal gas. No abdominal wall hernia. Musculoskeletal: No acute or destructive bony abnormalities. Reconstructed images demonstrate no additional findings. IMPRESSION: 1. Acute uncomplicated sigmoid diverticulitis. No perforation, fluid collection, or abscess. 2.  Aortic Atherosclerosis (ICD10-I70.0). Electronically Signed   By: Bobbye Burrow M.D.   On: 03/05/2024 21:31     Assessment/Plan Principal Problem:   Acute GI bleeding Active Problems:   Paroxysmal atrial fibrillation (HCC)   OSA (obstructive sleep apnea)   Tobacco use   ABLA (acute blood loss anemia)   Diverticulitis   Depression with anxiety    Acute GI bleeding -    likely upper GI bleed given the melanotic stools.  Keep patient on Protonix  IV.  Keep  patient n.p.o. and hold Eliquis  as recommended by cardiologist.  Transfuse if hemoglobin drops further.  [facility] has been consulted. Acute blood loss anemia/macrocytic anemia -    will transfuse PRBC if there is any further decline in hemoglobin.  Check anemia panel follow CBC. Atrial fibrillation status post recent Watchman device placement appreciate cardiology consult.  Will hold patient's metoprolol  for now since blood pressure is in the low normal.  Eliquis  on hold due to GI bleed as recommended by cardiologist. Acute sigmoid diverticulitis on antibiotics. Hyperlipidemia takes statins. History of depression anxiety on Zoloft  and as needed Ativan . Tobacco abuse advised about quitting smoking. Sleep apnea on CPAP at bedtime.   Given that patient has acute GI bleeding in the setting of anticoagulation will need close monitoring and more than 2 midnight stay.   DVT prophylaxis: SCDs. Code Status: Full code. Family Communication: Discussed with patient.  Disposition Plan: Progressive care. Consults called:  gastroenterologist and cardiology. Admission status: Patient.

## 2024-03-05 NOTE — ED Triage Notes (Signed)
 Pt had surgery last week and went for rountine blood work Monday. Received a call from surgeon to come to ER due to low hemoglobin. Pt states that 4 days after surgery had black stools but the past 3 days has had brown stool. Denies any pain but has been tired and dizzy.

## 2024-03-06 DIAGNOSIS — K921 Melena: Secondary | ICD-10-CM | POA: Diagnosis not present

## 2024-03-06 DIAGNOSIS — Z860100 Personal history of colon polyps, unspecified: Secondary | ICD-10-CM

## 2024-03-06 DIAGNOSIS — K5732 Diverticulitis of large intestine without perforation or abscess without bleeding: Secondary | ICD-10-CM | POA: Diagnosis not present

## 2024-03-06 DIAGNOSIS — K625 Hemorrhage of anus and rectum: Secondary | ICD-10-CM | POA: Diagnosis not present

## 2024-03-06 DIAGNOSIS — K2289 Other specified disease of esophagus: Secondary | ICD-10-CM | POA: Diagnosis not present

## 2024-03-06 DIAGNOSIS — K922 Gastrointestinal hemorrhage, unspecified: Secondary | ICD-10-CM

## 2024-03-06 DIAGNOSIS — Z95818 Presence of other cardiac implants and grafts: Secondary | ICD-10-CM

## 2024-03-06 DIAGNOSIS — I4891 Unspecified atrial fibrillation: Secondary | ICD-10-CM | POA: Diagnosis not present

## 2024-03-06 DIAGNOSIS — R1013 Epigastric pain: Secondary | ICD-10-CM

## 2024-03-06 DIAGNOSIS — K222 Esophageal obstruction: Secondary | ICD-10-CM | POA: Diagnosis not present

## 2024-03-06 DIAGNOSIS — K649 Unspecified hemorrhoids: Secondary | ICD-10-CM

## 2024-03-06 DIAGNOSIS — D62 Acute posthemorrhagic anemia: Secondary | ICD-10-CM | POA: Diagnosis not present

## 2024-03-06 DIAGNOSIS — Z7901 Long term (current) use of anticoagulants: Secondary | ICD-10-CM | POA: Diagnosis not present

## 2024-03-06 LAB — CBC
HCT: 23 % — ABNORMAL LOW (ref 36.0–46.0)
HCT: 22.8 % — ABNORMAL LOW (ref 36.0–46.0)
HCT: 29.1 % — ABNORMAL LOW (ref 36.0–46.0)
HCT: 27.7 % — ABNORMAL LOW (ref 36.0–46.0)
Hemoglobin: 7.3 g/dL — ABNORMAL LOW (ref 12.0–15.0)
Hemoglobin: 7.3 g/dL — ABNORMAL LOW (ref 12.0–15.0)
Hemoglobin: 9.4 g/dL — ABNORMAL LOW (ref 12.0–15.0)
Hemoglobin: 9.1 g/dL — ABNORMAL LOW (ref 12.0–15.0)
MCH: 34.8 pg — ABNORMAL HIGH (ref 26.0–34.0)
MCH: 34.6 pg — ABNORMAL HIGH (ref 26.0–34.0)
MCH: 32.6 pg (ref 26.0–34.0)
MCH: 33.2 pg (ref 26.0–34.0)
MCHC: 31.7 g/dL (ref 30.0–36.0)
MCHC: 32 g/dL (ref 30.0–36.0)
MCHC: 32.3 g/dL (ref 30.0–36.0)
MCHC: 32.9 g/dL (ref 30.0–36.0)
MCV: 109.5 fL — ABNORMAL HIGH (ref 80.0–100.0)
MCV: 108.1 fL — ABNORMAL HIGH (ref 80.0–100.0)
MCV: 101 fL — ABNORMAL HIGH (ref 80.0–100.0)
MCV: 101.1 fL — ABNORMAL HIGH (ref 80.0–100.0)
Platelets: 216 10*3/uL (ref 150–400)
Platelets: 197 10*3/uL (ref 150–400)
Platelets: 220 10*3/uL (ref 150–400)
Platelets: 194 10*3/uL (ref 150–400)
RBC: 2.1 MIL/uL — ABNORMAL LOW (ref 3.87–5.11)
RBC: 2.11 MIL/uL — ABNORMAL LOW (ref 3.87–5.11)
RBC: 2.88 MIL/uL — ABNORMAL LOW (ref 3.87–5.11)
RBC: 2.74 MIL/uL — ABNORMAL LOW (ref 3.87–5.11)
RDW: 14.1 % (ref 11.5–15.5)
RDW: 14.2 % (ref 11.5–15.5)
RDW: 19.3 % — ABNORMAL HIGH (ref 11.5–15.5)
RDW: 19.7 % — ABNORMAL HIGH (ref 11.5–15.5)
WBC: 9 10*3/uL (ref 4.0–10.5)
WBC: 8.6 10*3/uL (ref 4.0–10.5)
WBC: 8.5 10*3/uL (ref 4.0–10.5)
WBC: 8.1 10*3/uL (ref 4.0–10.5)
nRBC: 0 % (ref 0.0–0.2)
nRBC: 0 % (ref 0.0–0.2)
nRBC: 0 % (ref 0.0–0.2)
nRBC: 0 % (ref 0.0–0.2)

## 2024-03-06 LAB — IRON AND TIBC
Iron: 31 ug/dL (ref 28–170)
Saturation Ratios: 7 % — ABNORMAL LOW (ref 10.4–31.8)
TIBC: 420 ug/dL (ref 250–450)
UIBC: 389 ug/dL

## 2024-03-06 LAB — COMPREHENSIVE METABOLIC PANEL WITH GFR
ALT: 14 U/L (ref 0–44)
AST: 21 U/L (ref 15–41)
Albumin: 2.8 g/dL — ABNORMAL LOW (ref 3.5–5.0)
Alkaline Phosphatase: 46 U/L (ref 38–126)
Anion gap: 9 (ref 5–15)
BUN: 7 mg/dL — ABNORMAL LOW (ref 8–23)
CO2: 21 mmol/L — ABNORMAL LOW (ref 22–32)
Calcium: 8.3 mg/dL — ABNORMAL LOW (ref 8.9–10.3)
Chloride: 107 mmol/L (ref 98–111)
Creatinine, Ser: 0.77 mg/dL (ref 0.44–1.00)
GFR, Estimated: >60 mL/min (ref >60)
Glucose, Bld: 100 mg/dL — ABNORMAL HIGH (ref 70–99)
Potassium: 3.7 mmol/L (ref 3.5–5.1)
Sodium: 137 mmol/L (ref 135–145)
Total Bilirubin: 0.5 mg/dL (ref 0.0–1.2)
Total Protein: 5.6 g/dL — ABNORMAL LOW (ref 6.5–8.1)

## 2024-03-06 LAB — RETICULOCYTES
Immature Retic Fract: 33.7 % — ABNORMAL HIGH (ref 2.3–15.9)
RBC.: 2.17 MIL/uL — ABNORMAL LOW (ref 3.87–5.11)
Retic Count, Absolute: 131.5 10*3/uL (ref 19.0–186.0)
Retic Ct Pct: 6.1 % — ABNORMAL HIGH (ref 0.4–3.1)

## 2024-03-06 LAB — PREPARE RBC (CROSSMATCH)

## 2024-03-06 LAB — GLUCOSE, CAPILLARY
Glucose-Capillary: 106 mg/dL — ABNORMAL HIGH (ref 70–99)
Glucose-Capillary: 103 mg/dL — ABNORMAL HIGH (ref 70–99)

## 2024-03-06 LAB — FERRITIN: Ferritin: 63 ng/mL (ref 11–307)

## 2024-03-06 LAB — VITAMIN B12: Vitamin B-12: 624 pg/mL (ref 180–914)

## 2024-03-06 LAB — FOLATE: Folate: 7.6 ng/mL (ref >5.9)

## 2024-03-06 LAB — CBG MONITORING, ED: Glucose-Capillary: 100 mg/dL — ABNORMAL HIGH (ref 70–99)

## 2024-03-06 MED ORDER — SIMVASTATIN 20 MG PO TABS
20.0000 mg | ORAL_TABLET | Freq: Every day | ORAL | Status: DC
  Administered 2024-03-06 – 2024-03-07 (×2): 20 mg via ORAL
  Filled 2024-03-06 (×2): qty 1

## 2024-03-06 MED ORDER — SODIUM CHLORIDE 0.9% IV SOLUTION: Freq: Once | INTRAVENOUS | Status: AC

## 2024-03-06 MED ORDER — SERTRALINE HCL 100 MG PO TABS
150.0000 mg | ORAL_TABLET | Freq: Every day | ORAL | Status: DC
  Administered 2024-03-06 – 2024-03-08 (×3): 150 mg via ORAL
  Filled 2024-03-06 (×3): qty 1

## 2024-03-06 MED ORDER — POLYVINYL ALCOHOL 1.4 % OP SOLN: 1.0000 [drp] | OPHTHALMIC | Status: DC | PRN

## 2024-03-06 MED ORDER — ACETAMINOPHEN 325 MG PO TABS
650.0000 mg | ORAL_TABLET | Freq: Four times a day (QID) | ORAL | Status: DC | PRN
  Administered 2024-03-06 – 2024-03-08 (×2): 650 mg via ORAL
  Filled 2024-03-06 (×2): qty 2

## 2024-03-06 MED ORDER — TRAZODONE HCL 50 MG PO TABS
100.0000 mg | ORAL_TABLET | Freq: Every day | ORAL | Status: DC
  Administered 2024-03-06 – 2024-03-07 (×2): 100 mg via ORAL
  Filled 2024-03-06 (×2): qty 2

## 2024-03-06 MED ORDER — CARBOXYMETHYLCELLULOSE SODIUM 1 % OP SOLN: 1.0000 [drp] | Freq: Two times a day (BID) | OPHTHALMIC | Status: DC | PRN

## 2024-03-06 MED ORDER — MELATONIN 5 MG PO TABS: 5.0000 mg | ORAL_TABLET | Freq: Every evening | ORAL | Status: DC | PRN

## 2024-03-06 MED ORDER — HEPARIN (PORCINE) 25000 UT/250ML-% IV SOLN
950.0000 [IU]/h | INTRAVENOUS | Status: DC
  Administered 2024-03-06: 850 [IU]/h via INTRAVENOUS
  Filled 2024-03-06: qty 250

## 2024-03-06 MED ORDER — PROCHLORPERAZINE EDISYLATE 10 MG/2ML IJ SOLN: 5.0000 mg | Freq: Four times a day (QID) | INTRAMUSCULAR | Status: DC | PRN

## 2024-03-06 MED ORDER — POLYETHYLENE GLYCOL 3350 17 G PO PACK: 17.0000 g | PACK | Freq: Every day | ORAL | Status: DC | PRN

## 2024-03-06 MED ORDER — POTASSIUM CHLORIDE 10 MEQ/100ML IV SOLN
10.0000 meq | INTRAVENOUS | Status: AC
  Administered 2024-03-06 (×2): 10 meq via INTRAVENOUS
  Filled 2024-03-06 (×2): qty 100

## 2024-03-06 MED ORDER — LORAZEPAM 0.5 MG PO TABS
0.5000 mg | ORAL_TABLET | Freq: Two times a day (BID) | ORAL | Status: DC | PRN
  Administered 2024-03-06 – 2024-03-07 (×2): 0.5 mg via ORAL
  Filled 2024-03-06 (×2): qty 1

## 2024-03-06 NOTE — H&P (View-Only) (Signed)
 Consultation Note   Referring Provider:  [facility] PCP: Alejandro Hurt, FNP Primary Gastroenterologist::   Dr. Robertha China with [facility] Reason for Consultation: Acute drop in hemoglobin DOA: 03/05/2024         Hospital Day: 2   ASSESSMENT    73 y.o. year old female with a medical history including but not limited to colon polyps , hemorrhoids status post banding , chronic abdominal pain, atrial fibrillation on Eliquis , recent Watchman device placement.   Acute blood loss anemia on Eliquis  Iron studies not really compatible with iron deficiency though TIBC on upper limits of normal with a low iron saturation.  Folate 63  Generalized abdominal pain (seems mainly upper).  Uncomplicated sigmoid diverticulitis. Nontender on exam, normal white count  Chronic intermittent rectal bleeding 2/2 hemorrhoids History hemorrhoidal banding x 3  Reported history of colon polyps Followed by GI with [facility]  AFIB, on Eliquis   Recent Watchman placement  Chronic macrocytosis (previously not associated with anemia)   See PMH for additional history  Principal Problem:   Acute GI bleeding Active Problems:   Paroxysmal atrial fibrillation (HCC)   OSA (obstructive sleep apnea)   Tobacco use   ABLA (acute blood loss anemia)   Diverticulitis   Depression with anxiety     PLAN:   --Continue Rocephin  and Flagyl  --She will need an EGD in the am. The risks and benefits of EGD with possible biopsies were discussed with the patient who agrees to proceed.  -- Clear liquids today, NPO after MN --For now, continue IV Pantoprazole  40 mg Q 6. --Trend H/H. Hgb stable right now at 7.3.    HPI   Theresa Reynolds had a watchman placed on 02/28/24.  On the day of the procedure she received IV heparin  and then resumed Eliquis . After returning home from the procedure she began having black color diarrhea which continued for 48 hours.  She also  developed generalized upper abdominal discomfort with radiation through to her back.  She did not really eat due to the pain.  She does not take NSAIDs.  No history of PUD.  BMs eventually returned to normal. Incidental labs drawn on 03/03/2024 showed a significant drop in hemoglobin from 14.8 preprocedure to 8.5.  She presented to the ED and was admitted on 03/05/2024 .   Pertinent studies: BUN normal, albumin 3.3 TIBC 420, 7% iron saturation, ferritin 63, folate 7.6, B12 624 CT scan AP with contrast-diverticulosis of the sigmoid colon with segmental wall thickening and pericolonic fat stranding consistent with acute diverticulitis.  No complicating features  Hemoglobin declined further to 7.3 this admission and she has now received a unit of RBCs. Heme negative.    Previous GI Studies   Gives a history of several colonoscopies with GI provider at [facility]  Colonoscopy 2009 Hyperplastic polyps  Labs and Imaging:  Recent Labs    03/05/24 2235 03/06/24 0100 03/06/24 0439  WBC 10.0 9.0 8.6  HGB 8.0* 7.3* 7.3*  HCT 24.7* 23.0* 22.8*  MCV 108.3* 109.5* 108.1*  PLT 212 216 197   Recent Labs    03/06/24 0100  FOLATE 7.6  VITAMINB12 624  FERRITIN 63  TIBC 420  IRONPCTSAT 7*   Recent Labs  03/05/24 1807 03/06/24 0439  NA 135 137  K 3.2* 3.7  CL 103 107  CO2 20* 21*  GLUCOSE 121* 100*  BUN 7* 7*  CREATININE 0.71 0.77  CALCIUM 9.0 8.3*   Recent Labs    03/05/24 1807 03/06/24 0439  PROT 6.5 5.6*  ALBUMIN 3.3* 2.8*  AST 25 21  ALT 16 14  ALKPHOS 55 46  BILITOT 0.4 0.5   Recent Labs    03/05/24 1807  INR 1.3*   No results for input(s): "AFPTUMOR" in the last 72 hours.  CT ABDOMEN PELVIS W CONTRAST CLINICAL DATA:  Abdominal pain  EXAM: CT ABDOMEN AND PELVIS WITH CONTRAST  TECHNIQUE: Multidetector CT imaging of the abdomen and pelvis was performed using the standard protocol following bolus administration of intravenous contrast.  RADIATION DOSE  REDUCTION: This exam was performed according to the departmental dose-optimization program which includes automated exposure control, adjustment of the mA and/or kV according to patient size and/or use of iterative reconstruction technique.  CONTRAST:  75mL OMNIPAQUE  IOHEXOL  350 MG/ML SOLN  COMPARISON:  None Available.  FINDINGS: Lower chest: No acute pleural or parenchymal lung disease.  Hepatobiliary: No focal liver abnormality is seen. No gallstones, gallbladder wall thickening, or biliary dilatation.  Pancreas: Unremarkable. No pancreatic ductal dilatation or surrounding inflammatory changes.  Spleen: Normal in size without focal abnormality.  Adrenals/Urinary Tract: Kidneys are unremarkable without urinary tract calculi or obstructive uropathy. The adrenals and bladder are normal.  Stomach/Bowel: No bowel obstruction or ileus. Diverticulosis of the sigmoid colon, with segmental wall thickening and pericolonic fat stranding consistent with acute diverticulitis. No perforation, fluid collection, or abscess. Normal appendix right lower quadrant.  Vascular/Lymphatic: Aortic atherosclerosis. No enlarged abdominal or pelvic lymph nodes.  Reproductive: Uterus and bilateral adnexa are unremarkable.  Other: No free fluid or free intraperitoneal gas. No abdominal wall hernia.  Musculoskeletal: No acute or destructive bony abnormalities. Reconstructed images demonstrate no additional findings.  IMPRESSION: 1. Acute uncomplicated sigmoid diverticulitis. No perforation, fluid collection, or abscess. 2.  Aortic Atherosclerosis (ICD10-I70.0).  Electronically Signed   By: Bobbye Burrow M.D.   On: 03/05/2024 21:31 DG Chest 2 View CLINICAL DATA:  Preop chest radiograph. History of paroxysmal atrial fibrillation.  EXAM: CHEST - 2 VIEW  COMPARISON:  09/19/2023.  FINDINGS: Cardiac silhouette is normal in size and configuration. Stable loop recorder, left anterior chest  wall. Normal mediastinal and hilar contours.  Lungs are mildly hyperexpanded, but clear. No pleural effusion or pneumothorax.  Skeletal structures are intact.  IMPRESSION: No active cardiopulmonary disease.  Electronically Signed   By: Amanda Jungling M.D.   On: 03/05/2024 11:54    Past Medical History:  Diagnosis Date   Anxiety and depression    s/p suicide attempt 11/11   Aortic insufficiency    mild by echo 10/17   Atrial fibrillation (HCC)    Carotid stenosis, bilateral    0-39% by US    Colitis    Hepatitis C    Followed by Dr. Margarete Sharps at [facility].  s/p treatment with Bavoni.   History of shingles    HTN (hypertension)    Hypercholesteremia    Hypersomnia    OSA (obstructive sleep apnea)    Right shoulder pain    Seasonal allergies    Syncope and collapse    Tobacco use    Vitamin D deficiency     Past Surgical History:  Procedure Laterality Date   ATRIAL FIBRILLATION ABLATION N/A 06/05/2017   Procedure: Atrial Fibrillation  Ablation;  Surgeon: Jolly Needle, MD;  Location: [facility] INVASIVE CV LAB;  Service: Cardiovascular;  Laterality: N/A;   BREAST REDUCTION SURGERY  10/07/1999   With Dr. Lindle Rhea   CESAREAN SECTION     LEFT ATRIAL APPENDAGE OCCLUSION N/A 02/28/2024   Procedure: LEFT ATRIAL APPENDAGE OCCLUSION;  Surgeon: Boyce Byes, MD;  Location: [facility] INVASIVE CV LAB;  Service: Cardiovascular;  Laterality: N/A;   LOOP RECORDER INSERTION N/A 05/07/2017   Procedure: Loop Recorder Insertion;  Surgeon: Jolly Needle, MD;  Location: [facility] INVASIVE CV LAB;  Service: Cardiovascular;  Laterality: N/A;   LOOP RECORDER INSERTION  04/18/2021   removal of previously implanted [id number] with subsequent placement of a Medtronic Reveal Linq model LNQ 2 implantable loop recorder  (SN# [id number])   MENISCUS REPAIR Left 2023   TRANSESOPHAGEAL ECHOCARDIOGRAM (CATH LAB) N/A 02/28/2024   Procedure: TRANSESOPHAGEAL ECHOCARDIOGRAM;  Surgeon: Boyce Byes, MD;   Location: [facility] INVASIVE CV LAB;  Service: Cardiovascular;  Laterality: N/A;    Family History  Problem Relation Age of Onset   Atrial fibrillation Mother    Mitral valve prolapse Mother    Atrial fibrillation Brother    Heart disease Maternal Grandfather     Prior to Admission medications   Medication Sig Start Date End Date Taking? Authorizing Provider  apixaban  (ELIQUIS ) 5 MG TABS tablet Take 1 tablet (5 mg total) by mouth 2 (two) times daily. 01/21/24 04/20/24 Yes Boyce Byes, MD  carboxymethylcellulose 1 % ophthalmic solution Place 1 drop into both eyes 2 (two) times daily as needed (dry/irrtated eyes).   Yes [provider]  fluticasone (FLONASE) 50 MCG/ACT nasal spray Place 2 sprays into both nostrils daily as needed for allergies or rhinitis.   Yes [provider]  LORazepam  (ATIVAN ) 1 MG tablet Take 0.5-1 mg by mouth 2 (two) times daily as needed for anxiety.   Yes [provider]  metoprolol  succinate (TOPROL -XL) 50 MG 24 hr tablet TAKE 1 TABLET BY MOUTH EVERY DAY WITH OR IMMEDIATELY FOLLOWING A MEAL 12/25/23  Yes Boyce Byes, MD  sertraline  (ZOLOFT ) 100 MG tablet Take 150 mg by mouth daily. 10/18/17  Yes [provider]  simvastatin  (ZOCOR ) 20 MG tablet Take 20 mg by mouth at bedtime.    Yes [provider]  traZODone  (DESYREL ) 100 MG tablet Take 100 mg by mouth at bedtime.   Yes [provider]    Current Facility-Administered Medications  Medication Dose Route Frequency Provider Last Rate Last Admin   cefTRIAXone  (ROCEPHIN ) 2 g in sodium chloride  0.9 % 100 mL IVPB  2 g Intravenous Q24H Angelene Kelly, MD 200 mL/hr at 03/06/24 1104 2 g at 03/06/24 1104   lactated ringers  infusion   Intravenous Continuous Angelene Kelly, MD 100 mL/hr at 03/05/24 2322 New Bag at 03/05/24 2322   metroNIDAZOLE  (FLAGYL ) IVPB 500 mg  500 mg Intravenous BID Angelene Kelly, MD       pantoprazole  (PROTONIX ) injection 40 mg   40 mg Intravenous Q6H Angelene Kelly, MD   40 mg at 03/06/24 1104    Allergies as of 03/05/2024 - Review Complete 03/05/2024  Allergen Reaction Noted   Cymbalta [duloxetine hcl] Other (See Comments) 10/27/2014    Social History   Socioeconomic History   Marital status: Divorced    Spouse name: Not on file   Number of children: 2   Years of education: Not on file   Highest education level: Not on file  Occupational History  Not on file  Tobacco Use   Smoking status: Every Day    Current packs/day: 1.00    Average packs/day: 1 pack/day for 30.0 years (30.0 ttl pk-yrs)    Types: Cigarettes   Smokeless tobacco: Never  Vaping Use   Vaping status: Never Used  Substance and Sexual Activity   Alcohol  use: Yes    Alcohol /week: 6.0 standard drinks of alcohol     Types: 6 Glasses of wine per week   Drug use: No   Sexual activity: Not on file  Other Topics Concern   Not on file  Social History Narrative   Not on file   Social Drivers of Health   Financial Resource Strain: Not on file  Food Insecurity: No Food Insecurity (03/06/2024)   Hunger Vital Sign    Worried About Running Out of Food in the Last Year: Never true    Ran Out of Food in the Last Year: Never true  Transportation Needs: No Transportation Needs (03/06/2024)   PRAPARE - Administrator, Civil Service (Medical): No    Lack of Transportation (Non-Medical): No  Physical Activity: Not on file  Stress: Not on file  Social Connections: Socially Isolated (03/06/2024)   Social Connection and Isolation Panel [NHANES]    Frequency of Communication with Friends and Family: More than three times a week    Frequency of Social Gatherings with Friends and Family: Once a week    Attends Religious Services: Never    Database administrator or Organizations: No    Attends Banker Meetings: Never    Marital Status: Divorced  Catering manager Violence: Not At Risk (03/06/2024)   Humiliation, Afraid, Rape,  and Kick questionnaire    Fear of Current or Ex-Partner: No    Emotionally Abused: No    Physically Abused: No    Sexually Abused: No     Code Status   Code Status: Full Code  Review of Systems: All systems reviewed and negative except where noted in HPI.  Physical Exam: Vital signs in last 24 hours: Temp:  [97.5 F (36.4 C)-98.7 F (37.1 C)] 97.5 F (36.4 C) (05/01 1057) Pulse Rate:  [53-100] 53 (05/01 1057) Resp:  [15-27] 19 (05/01 1057) BP: (96-127)/(36-79) 115/56 (05/01 1057) SpO2:  [94 %-100 %] 95 % (05/01 1057) Weight:  [71 kg-71.9 kg] 71.9 kg (05/01 0343) Last BM Date : 03/06/24  General:  Pleasant female in NAD Psych:  Cooperative. Normal mood and affect Eyes: Pupils equal Ears:  Normal auditory acuity Nose: No deformity, discharge or lesions Neck:  Supple, no masses felt Lungs:  Clear to auscultation.  Heart:  Regular rate, regular rhythm.  Abdomen:  Soft, nondistended, nontender, active bowel sounds, no masses felt Rectal :  Deferred Msk: Symmetrical without gross deformities.  Neurologic:  Alert, oriented, grossly normal neurologically Extremities : No edema Skin:  Intact without significant lesions.    Intake/Output from previous day: No intake/output data recorded. Intake/Output this shift:  Total I/O In: 315 [Blood:315] Out: -    Mai Schwalbe, NP-C   03/06/2024, 11:07 AM

## 2024-03-06 NOTE — Consult Note (Signed)
   ELECTROPHYSIOLOGY CONSULT NOTE    Patient ID: Theresa Reynolds MRN: [id number], DOB/AGE: 09-Dec-1950 73 y.o.  Admit date: 03/05/2024 Date of Consult: 03/06/2024  Primary Physician: Alejandro Hurt, FNP Primary Cardiologist: None  Electrophysiologist: Dr. Marven Slimmer   Referring Provider: Dr. Alvis Ba  Patient Profile: Theresa Reynolds is a 73 y.o. female with a history of AF and recent watchman device who is being seen today for the evaluation of Acute GI bleed at the request of Dr. Alvis Ba.  HPI:  Theresa Reynolds is a 73 y.o. female is well known to EP team from recent watchman procedure and prior follow up.   After returning home from [place] procedure 4/24, she started having black tarry diarrhea for the next 48 hrs leading into Saturday.  Stools gradually improved to solid and brown.   Incidental labs drawn on 4/28 and resulted 03/04/2024 showed large Hgb drop from 14.8 before the procedure to 8.5.  Repeat blood labs performed in the emergency room show virtually no change with a hemoglobin down to 8.2.  She actually has chronically macrocytic indices, but previously without anemia.   Of note, on the day of the procedure, after receiving standard anticoagulation with intravenous heparin  she received an initial dose of Eliquis  around 2 PM and took a second dose of Eliquis  at 11 PM.  Pt without symptoms at rest currently. Last dose of Eliquis  yesterday am. Hgb 7.3 and currently transfusing. NPO for GI to see.     Labs Potassium3.7 (05/01 0439)   Creatinine, ser  0.77 (05/01 0439) PLT  197 (05/01 0439) HGB  7.3* (05/01 0439) WBC 8.6 (05/01 0439)  .    Allergies, Medical, Surgical, Social, and Family Histories have been reviewed and are referenced here-in when relevant for medical decision making.    Physical Exam: Vitals:   03/06/24 0651 03/06/24 0711 03/06/24 0726 03/06/24 0846  BP: (!) 124/44 (!) 114/42 127/79 (!) 107/55  Pulse: (!) 59 (!) 58 (!) 59 (!) 57  Resp: 19 (!) 21  20 15   Temp: 98.4 F (36.9 C) 98.7 F (37.1 C) 98.2 F (36.8 C) 98.1 F (36.7 C)  TempSrc: Oral Oral Oral Oral  SpO2: 95% 95% 94%   Weight:      Height:        GEN- NAD, A&O x 3, normal affect HEENT: Normocephalic, atraumatic Lungs- CTAB, Normal effort.  Heart- Regular rate and rhythm, No M/G/R.  GI- Soft, NT, ND.  Extremities- No clubbing, cyanosis, or edema   EKG: No new EKG   TELEMETRY: SB/NSR 50-60s (personally reviewed)  DEVICE HISTORY:  Medtronic loop implanted 05/2017 for AF - ILR - [facility] S/p Watchman 02/28/24  Assessment/Plan:  Acute GI bleeding PLT  197 (05/01 0439) HGB  7.3* (05/01 0439) Transfusing per primary Pending GI consult.   Given higher risk of thromboembolic events for 6 weeks post Watchman, hope to limit anticoagulation interruption as much as possible.  PAF S/p Watchman device Maintaining NSR currently.  High risk of thromboembolic events immediately post watchman. Hope to limit OAC interruption as much as possible as above.   Dr. Marven Slimmer has seen, we will follow along  For questions or updates, please contact [facility] Please consult [email or web address] for contact info under Cardiology/STEMI.  Delorise Few, PA-C  03/06/2024 8:57 AM

## 2024-03-06 NOTE — Progress Notes (Signed)
 PHARMACY - ANTICOAGULATION CONSULT NOTE  Pharmacy Consult for Heparin  while Eliquis  on hold Indication: atrial fibrillation  Allergies  Allergen Reactions   Cymbalta [Duloxetine Hcl] Other (See Comments)    Dizzy    Patient Measurements: Height: 5\' 1"  (154.9 cm) Weight: 71.9 kg (158 lb 8.2 oz) IBW/kg (Calculated) : 47.8 HEPARIN  DW (KG): 63.4  Vital Signs: Temp: 98.1 F (36.7 C) (05/01 1600) Temp Source: Oral (05/01 1600) BP: 136/51 (05/01 1600) Pulse Rate: 56 (05/01 1600)  Labs: Recent Labs    03/05/24 1807 03/05/24 2235 03/06/24 0100 03/06/24 0439 03/06/24 1245  HGB 8.2*   < > 7.3* 7.3* 9.4*  HCT 25.7*   < > 23.0* 22.8* 29.1*  PLT 231   < > 216 197 220  LABPROT 16.1*  --   --   --   --   INR 1.3*  --   --   --   --   CREATININE 0.71  --   --  0.77  --    < > = values in this interval not displayed.    Estimated Creatinine Clearance: 58.4 mL/min (by C-G formula based on SCr of 0.77 mg/dL).   Medical History: Past Medical History:  Diagnosis Date   Anxiety and depression    s/p suicide attempt 11/11   Aortic insufficiency    mild by echo 10/17   Atrial fibrillation (HCC)    Carotid stenosis, bilateral    0-39% by US    Colitis    Hepatitis C    Followed by Dr. Margarete Sharps at [facility].  s/p treatment with Bavoni.   History of shingles    HTN (hypertension)    Hypercholesteremia    Hypersomnia    OSA (obstructive sleep apnea)    Right shoulder pain    Seasonal allergies    Syncope and collapse    Tobacco use    Vitamin D deficiency     Medications:  Scheduled:   pantoprazole  (PROTONIX ) IV  40 mg Intravenous Q6H   Infusions:   cefTRIAXone  (ROCEPHIN )  IV 2 g (03/06/24 1104)   heparin      lactated ringers  100 mL/hr at 03/06/24 1532   metronidazole  500 mg (03/06/24 1140)   PRN:   Assessment: 73 yo female with afib s/p Watchman device placement 02/28/24 who developed black colored diarrhea and found to have Hgb drop 14.8 > 8.5. Pharmacy  consulted to dose IV heparin  while Eliquis  on hold for GI evaluation.  Last dose of Eliquis  4/30 at 08:00 - will utilize aPTT to monitor heparin  as heparin  levels will be falsely elevated with recent DOAC use  Goal of Therapy:  Heparin  level 0.3-0.5 units/ml aPTT 66-85 seconds Monitor platelets by anticoagulation protocol: Yes   Plan:  No heparin  bolus Heparin  850 units/hr Check aPTT and HL in 8hrs and daily while on heparin  Continue to monitor H&H and platelets F/u [facility] plans after EGD  Armanda Bern, PharmD, BCPS 03/06/2024,6:35 PM  Please check AMION for all [facility] Pharmacy phone numbers After 10:00 PM, call Main Pharmacy [phone]

## 2024-03-06 NOTE — Progress Notes (Signed)
 PROGRESS NOTE    Theresa Reynolds  EXB:[id number] DOB: June 18, 1951 DOA: 03/05/2024 PCP: Alejandro Hurt, FNP    Chief Complaint  Patient presents with   Abnormal Labs    Brief Narrative:    Theresa Reynolds is a 73 y.o. female with history of atrial fibrillation status post recent Watchman device placement, hypertension hyperlipidemia, depression anxiety, tobacco abuse was requested to come to the ER after patient's blood work done at the office showed low hemoglobin around 8.  Patient had recent Watchman device placement for known history of A-fib with prior recurrent GI bleed.  Patient states over the last 4 days she has been having frequent loose bowel movements which were black in color.  Denies taking any NSAIDs.  Patient's last dose of Eliquis  was this morning.   ED Course: In the ER patient's blood pressure is in the low normal.  Hemoglobin is around 8 which is a drop from 14 from last month.  On-call cardiologist was consulted to get further input about of the recent Watchman device placement and GI bleed.  GI has been consulted.  CT abdomen pelvis done in the ER did not show any active GI bleed but did show features concerning for sigmoid diverticulitis.  Patient started on IV Protonix  and admitted for acute GI bleed.  Patient also was placed on antibiotics for diverticulitis.   Assessment & Plan:   Principal Problem:   Acute GI bleeding Active Problems:   Paroxysmal atrial fibrillation (HCC)   OSA (obstructive sleep apnea)   Tobacco use   ABLA (acute blood loss anemia)   Diverticulitis   Depression with anxiety  Acute GI bleeding Acute blood loss anemia Symptomatic anemia -Is most likely due to upper GI bleed in the setting of melena. - Continue to monitor and transfuse as needed, hemoglobin this morning 7.3, ordered 1 unit PRBC.  Closely, if lack to response or trending down will transfuse further. - Continue with IV Protonix  -GI input greatly appreciated, for endoscopy,  timing per GI. Aaron Aas Atrial fibrillation status post recent Watchman device placement  - High risk for stroke, will start on anticoagulation once, for now we will keep holding Eliquis , likely will start on heparin  drip after endoscopy, and if endoscopy is delayed, likely will start on low-dose with no bolus heparin  drip.  Acute sigmoid diverticulitis  -on antibiotics.  Hyperlipidemia - takes statins.  History of depression anxiety  -on Zoloft  and as needed Ativan .  Tobacco abuse  -advised about quitting smoking.  Sleep apnea  -on CPAP at bedtime.    DVT prophylaxis: Will do heparin  GTT as soon as possible pending her endoscopy. Code Status: Full Family Communication: Disposition:   Status is: Observation    Consultants:  Cardiology Gastroenterology  Subjective: No significant events overnight, reports fatigue  Objective: Vitals:   03/06/24 0711 03/06/24 0726 03/06/24 0846 03/06/24 1057  BP: (!) 114/42 127/79 (!) 107/55 (!) 115/56  Pulse: (!) 58 (!) 59 (!) 57 (!) 53  Resp: (!) 21 20 15 19   Temp: 98.7 F (37.1 C) 98.2 F (36.8 C) 98.1 F (36.7 C) (!) 97.5 F (36.4 C)  TempSrc: Oral Oral Oral Oral  SpO2: 95% 94%  95%  Weight:      Height:        Intake/Output Summary (Last 24 hours) at 03/06/2024 1230 Last data filed at 03/06/2024 1057 Gross per 24 hour  Intake 315 ml  Output --  Net 315 ml   [employer]   03/05/24  1800 03/05/24 2146 03/06/24 0343  Weight: 71 kg 71 kg 71.9 kg    Examination:  Awake Alert, Oriented X 3, No new F.N deficits, Normal affect Symmetrical Chest wall movement, Good air movement bilaterally, CTAB RRR,No Gallops,Rubs or new Murmurs, No Parasternal Heave +ve B.Sounds, Abd Soft, No tenderness, No rebound - guarding or rigidity. No Cyanosis, Clubbing or edema, No new Rash or bruise       Data Reviewed: I have personally reviewed following labs and imaging studies  CBC: Recent Labs  Lab 03/03/24 0800 03/05/24 1807  03/05/24 2235 03/06/24 0100 03/06/24 0439  WBC 9.1 9.3 10.0 9.0 8.6  NEUTROABS 6.8 5.9  --   --   --   HGB 8.5* 8.2* 8.0* 7.3* 7.3*  HCT 26.3* 25.7* 24.7* 23.0* 22.8*  MCV 108* 109.4* 108.3* 109.5* 108.1*  PLT 180 231 212 216 197    Basic Metabolic Panel: Recent Labs  Lab 03/03/24 0800 03/05/24 1807 03/06/24 0439  NA 137 135 137  K 3.5 3.2* 3.7  CL 104 103 107  CO2 20 20* 21*  GLUCOSE 148* 121* 100*  BUN 10 7* 7*  CREATININE 0.81 0.71 0.77  CALCIUM 8.9 9.0 8.3*    GFR: Estimated Creatinine Clearance: 58.4 mL/min (by C-G formula based on SCr of 0.77 mg/dL).  Liver Function Tests: Recent Labs  Lab 03/05/24 1807 03/06/24 0439  AST 25 21  ALT 16 14  ALKPHOS 55 46  BILITOT 0.4 0.5  PROT 6.5 5.6*  ALBUMIN 3.3* 2.8*    CBG: Recent Labs  Lab 03/06/24 0059 03/06/24 0623  GLUCAP 100* 106*     No results found for this or any previous visit (from the past 240 hours).       Radiology Studies: CT ABDOMEN PELVIS W CONTRAST Result Date: 03/05/2024 CLINICAL DATA:  Abdominal pain EXAM: CT ABDOMEN AND PELVIS WITH CONTRAST TECHNIQUE: Multidetector CT imaging of the abdomen and pelvis was performed using the standard protocol following bolus administration of intravenous contrast. RADIATION DOSE REDUCTION: This exam was performed according to the departmental dose-optimization program which includes automated exposure control, adjustment of the mA and/or kV according to patient size and/or use of iterative reconstruction technique. CONTRAST:  75mL OMNIPAQUE  IOHEXOL  350 MG/ML SOLN COMPARISON:  None Available. FINDINGS: Lower chest: No acute pleural or parenchymal lung disease. Hepatobiliary: No focal liver abnormality is seen. No gallstones, gallbladder wall thickening, or biliary dilatation. Pancreas: Unremarkable. No pancreatic ductal dilatation or surrounding inflammatory changes. Spleen: Normal in size without focal abnormality. Adrenals/Urinary Tract: Kidneys are  unremarkable without urinary tract calculi or obstructive uropathy. The adrenals and bladder are normal. Stomach/Bowel: No bowel obstruction or ileus. Diverticulosis of the sigmoid colon, with segmental wall thickening and pericolonic fat stranding consistent with acute diverticulitis. No perforation, fluid collection, or abscess. Normal appendix right lower quadrant. Vascular/Lymphatic: Aortic atherosclerosis. No enlarged abdominal or pelvic lymph nodes. Reproductive: Uterus and bilateral adnexa are unremarkable. Other: No free fluid or free intraperitoneal gas. No abdominal wall hernia. Musculoskeletal: No acute or destructive bony abnormalities. Reconstructed images demonstrate no additional findings. IMPRESSION: 1. Acute uncomplicated sigmoid diverticulitis. No perforation, fluid collection, or abscess. 2.  Aortic Atherosclerosis (ICD10-I70.0). Electronically Signed   By: Bobbye Burrow M.D.   On: 03/05/2024 21:31        Scheduled Meds:  pantoprazole  (PROTONIX ) IV  40 mg Intravenous Q6H   Continuous Infusions:  cefTRIAXone  (ROCEPHIN )  IV 2 g (03/06/24 1104)   lactated ringers  100 mL/hr at 03/05/24 2322   metronidazole   500 mg (03/06/24 1140)     LOS: 0 days      Seena Dadds, MD [facility]   To contact the attending provider between 7A-7P or the covering provider during after hours 7P-7A, please log into the web site [email or web address] and access using universal [place] password for that web site. If you do not have the password, please call the hospital operator.  03/06/2024, 12:30 PM

## 2024-03-06 NOTE — Progress Notes (Signed)
   03/06/24 1012  TOC Brief Assessment  Insurance and Status Reviewed Administrator Medicare HMO/PPO)  Patient has primary care physician Yes Jamee Mazzoni, Liane Redman, FNP)  Home environment has been reviewed From Home  Prior level of function: Independent  Prior/Current Home Services No current home services  Social Drivers of Health Review SDOH reviewed no interventions necessary  Readmission risk has been reviewed Yes (N/A Listed)  Transition of care needs no transition of care needs at this time   Patient presented with low HGB  Recent Watchman procedure. No TOC need identified at thisa time  Please place consult should a TOC need arise

## 2024-03-06 NOTE — Plan of Care (Signed)

## 2024-03-06 NOTE — Consult Note (Addendum)
 Consultation Note   Referring Provider:  [facility] PCP: Alejandro Hurt, FNP Primary Gastroenterologist::   Dr. Robertha China with [facility] Reason for Consultation: Acute drop in hemoglobin DOA: 03/05/2024         Hospital Day: 2   ASSESSMENT    73 y.o. year old female with a medical history including but not limited to colon polyps , hemorrhoids status post banding , chronic abdominal pain, atrial fibrillation on Eliquis , recent Watchman device placement.   Acute blood loss anemia on Eliquis  Iron studies not really compatible with iron deficiency though TIBC on upper limits of normal with a low iron saturation.  Folate 63  Generalized abdominal pain (seems mainly upper).  Uncomplicated sigmoid diverticulitis. Nontender on exam, normal white count  Chronic intermittent rectal bleeding 2/2 hemorrhoids History hemorrhoidal banding x 3  Reported history of colon polyps Followed by GI with [facility]  AFIB, on Eliquis   Recent Watchman placement  Chronic macrocytosis (previously not associated with anemia)   See PMH for additional history  Principal Problem:   Acute GI bleeding Active Problems:   Paroxysmal atrial fibrillation (HCC)   OSA (obstructive sleep apnea)   Tobacco use   ABLA (acute blood loss anemia)   Diverticulitis   Depression with anxiety     PLAN:   --Continue Rocephin  and Flagyl  --She will need an EGD in the am. The risks and benefits of EGD with possible biopsies were discussed with the patient who agrees to proceed.  -- Clear liquids today, NPO after MN --For now, continue IV Pantoprazole  40 mg Q 6. --Trend H/H. Hgb stable right now at 7.3.    HPI   Theresa Reynolds had a watchman placed on 02/28/24.  On the day of the procedure she received IV heparin  and then resumed Eliquis . After returning home from the procedure she began having black color diarrhea which continued for 48 hours.  She also  developed generalized upper abdominal discomfort with radiation through to her back.  She did not really eat due to the pain.  She does not take NSAIDs.  No history of PUD.  BMs eventually returned to normal. Incidental labs drawn on 03/03/2024 showed a significant drop in hemoglobin from 14.8 preprocedure to 8.5.  She presented to the ED and was admitted on 03/05/2024 .   Pertinent studies: BUN normal, albumin 3.3 TIBC 420, 7% iron saturation, ferritin 63, folate 7.6, B12 624 CT scan AP with contrast-diverticulosis of the sigmoid colon with segmental wall thickening and pericolonic fat stranding consistent with acute diverticulitis.  No complicating features  Hemoglobin declined further to 7.3 this admission and she has now received a unit of RBCs. Heme negative.    Previous GI Studies   Gives a history of several colonoscopies with GI provider at [facility]  Colonoscopy 2009 Hyperplastic polyps  Labs and Imaging:  Recent Labs    03/05/24 2235 03/06/24 0100 03/06/24 0439  WBC 10.0 9.0 8.6  HGB 8.0* 7.3* 7.3*  HCT 24.7* 23.0* 22.8*  MCV 108.3* 109.5* 108.1*  PLT 212 216 197   Recent Labs    03/06/24 0100  FOLATE 7.6  VITAMINB12 624  FERRITIN 63  TIBC 420  IRONPCTSAT 7*   Recent Labs  03/05/24 1807 03/06/24 0439  NA 135 137  K 3.2* 3.7  CL 103 107  CO2 20* 21*  GLUCOSE 121* 100*  BUN 7* 7*  CREATININE 0.71 0.77  CALCIUM 9.0 8.3*   Recent Labs    03/05/24 1807 03/06/24 0439  PROT 6.5 5.6*  ALBUMIN 3.3* 2.8*  AST 25 21  ALT 16 14  ALKPHOS 55 46  BILITOT 0.4 0.5   Recent Labs    03/05/24 1807  INR 1.3*   No results for input(s): "AFPTUMOR" in the last 72 hours.  CT ABDOMEN PELVIS W CONTRAST CLINICAL DATA:  Abdominal pain  EXAM: CT ABDOMEN AND PELVIS WITH CONTRAST  TECHNIQUE: Multidetector CT imaging of the abdomen and pelvis was performed using the standard protocol following bolus administration of intravenous contrast.  RADIATION DOSE  REDUCTION: This exam was performed according to the departmental dose-optimization program which includes automated exposure control, adjustment of the mA and/or kV according to patient size and/or use of iterative reconstruction technique.  CONTRAST:  75mL OMNIPAQUE  IOHEXOL  350 MG/ML SOLN  COMPARISON:  None Available.  FINDINGS: Lower chest: No acute pleural or parenchymal lung disease.  Hepatobiliary: No focal liver abnormality is seen. No gallstones, gallbladder wall thickening, or biliary dilatation.  Pancreas: Unremarkable. No pancreatic ductal dilatation or surrounding inflammatory changes.  Spleen: Normal in size without focal abnormality.  Adrenals/Urinary Tract: Kidneys are unremarkable without urinary tract calculi or obstructive uropathy. The adrenals and bladder are normal.  Stomach/Bowel: No bowel obstruction or ileus. Diverticulosis of the sigmoid colon, with segmental wall thickening and pericolonic fat stranding consistent with acute diverticulitis. No perforation, fluid collection, or abscess. Normal appendix right lower quadrant.  Vascular/Lymphatic: Aortic atherosclerosis. No enlarged abdominal or pelvic lymph nodes.  Reproductive: Uterus and bilateral adnexa are unremarkable.  Other: No free fluid or free intraperitoneal gas. No abdominal wall hernia.  Musculoskeletal: No acute or destructive bony abnormalities. Reconstructed images demonstrate no additional findings.  IMPRESSION: 1. Acute uncomplicated sigmoid diverticulitis. No perforation, fluid collection, or abscess. 2.  Aortic Atherosclerosis (ICD10-I70.0).  Electronically Signed   By: Bobbye Burrow M.D.   On: 03/05/2024 21:31 DG Chest 2 View CLINICAL DATA:  Preop chest radiograph. History of paroxysmal atrial fibrillation.  EXAM: CHEST - 2 VIEW  COMPARISON:  09/19/2023.  FINDINGS: Cardiac silhouette is normal in size and configuration. Stable loop recorder, left anterior chest  wall. Normal mediastinal and hilar contours.  Lungs are mildly hyperexpanded, but clear. No pleural effusion or pneumothorax.  Skeletal structures are intact.  IMPRESSION: No active cardiopulmonary disease.  Electronically Signed   By: Amanda Jungling M.D.   On: 03/05/2024 11:54    Past Medical History:  Diagnosis Date   Anxiety and depression    s/p suicide attempt 11/11   Aortic insufficiency    mild by echo 10/17   Atrial fibrillation (HCC)    Carotid stenosis, bilateral    0-39% by US    Colitis    Hepatitis C    Followed by Dr. Margarete Sharps at [facility].  s/p treatment with Bavoni.   History of shingles    HTN (hypertension)    Hypercholesteremia    Hypersomnia    OSA (obstructive sleep apnea)    Right shoulder pain    Seasonal allergies    Syncope and collapse    Tobacco use    Vitamin D deficiency     Past Surgical History:  Procedure Laterality Date   ATRIAL FIBRILLATION ABLATION N/A 06/05/2017   Procedure: Atrial Fibrillation  Ablation;  Surgeon: Jolly Needle, MD;  Location: [facility] INVASIVE CV LAB;  Service: Cardiovascular;  Laterality: N/A;   BREAST REDUCTION SURGERY  10/07/1999   With Dr. Lindle Rhea   CESAREAN SECTION     LEFT ATRIAL APPENDAGE OCCLUSION N/A 02/28/2024   Procedure: LEFT ATRIAL APPENDAGE OCCLUSION;  Surgeon: Boyce Byes, MD;  Location: [facility] INVASIVE CV LAB;  Service: Cardiovascular;  Laterality: N/A;   LOOP RECORDER INSERTION N/A 05/07/2017   Procedure: Loop Recorder Insertion;  Surgeon: Jolly Needle, MD;  Location: [facility] INVASIVE CV LAB;  Service: Cardiovascular;  Laterality: N/A;   LOOP RECORDER INSERTION  04/18/2021   removal of previously implanted LKGM0 with subsequent placement of a Medtronic Reveal Linq model LNQ 2 implantable loop recorder  (SN# [id number])   MENISCUS REPAIR Left 2023   TRANSESOPHAGEAL ECHOCARDIOGRAM (CATH LAB) N/A 02/28/2024   Procedure: TRANSESOPHAGEAL ECHOCARDIOGRAM;  Surgeon: Boyce Byes, MD;   Location: [facility] INVASIVE CV LAB;  Service: Cardiovascular;  Laterality: N/A;    Family History  Problem Relation Age of Onset   Atrial fibrillation Mother    Mitral valve prolapse Mother    Atrial fibrillation Brother    Heart disease Maternal Grandfather     Prior to Admission medications   Medication Sig Start Date End Date Taking? Authorizing Provider  apixaban  (ELIQUIS ) 5 MG TABS tablet Take 1 tablet (5 mg total) by mouth 2 (two) times daily. 01/21/24 04/20/24 Yes Boyce Byes, MD  carboxymethylcellulose 1 % ophthalmic solution Place 1 drop into both eyes 2 (two) times daily as needed (dry/irrtated eyes).   Yes [provider]  fluticasone (FLONASE) 50 MCG/ACT nasal spray Place 2 sprays into both nostrils daily as needed for allergies or rhinitis.   Yes [provider]  LORazepam  (ATIVAN ) 1 MG tablet Take 0.5-1 mg by mouth 2 (two) times daily as needed for anxiety.   Yes [provider]  metoprolol  succinate (TOPROL -XL) 50 MG 24 hr tablet TAKE 1 TABLET BY MOUTH EVERY DAY WITH OR IMMEDIATELY FOLLOWING A MEAL 12/25/23  Yes Boyce Byes, MD  sertraline  (ZOLOFT ) 100 MG tablet Take 150 mg by mouth daily. 10/18/17  Yes [provider]  simvastatin  (ZOCOR ) 20 MG tablet Take 20 mg by mouth at bedtime.    Yes [provider]  traZODone  (DESYREL ) 100 MG tablet Take 100 mg by mouth at bedtime.   Yes [provider]    Current Facility-Administered Medications  Medication Dose Route Frequency Provider Last Rate Last Admin   cefTRIAXone  (ROCEPHIN ) 2 g in sodium chloride  0.9 % 100 mL IVPB  2 g Intravenous Q24H Angelene Kelly, MD 200 mL/hr at 03/06/24 1104 2 g at 03/06/24 1104   lactated ringers  infusion   Intravenous Continuous Angelene Kelly, MD 100 mL/hr at 03/05/24 2322 New Bag at 03/05/24 2322   metroNIDAZOLE  (FLAGYL ) IVPB 500 mg  500 mg Intravenous BID Angelene Kelly, MD       pantoprazole  (PROTONIX ) injection 40 mg   40 mg Intravenous Q6H Angelene Kelly, MD   40 mg at 03/06/24 1104    Allergies as of 03/05/2024 - Review Complete 03/05/2024  Allergen Reaction Noted   Cymbalta [duloxetine hcl] Other (See Comments) 10/27/2014    Social History   Socioeconomic History   Marital status: Divorced    Spouse name: Not on file   Number of children: 2   Years of education: Not on file   Highest education level: Not on file  Occupational History  Not on file  Tobacco Use   Smoking status: Every Day    Current packs/day: 1.00    Average packs/day: 1 pack/day for 30.0 years (30.0 ttl pk-yrs)    Types: Cigarettes   Smokeless tobacco: Never  Vaping Use   Vaping status: Never Used  Substance and Sexual Activity   Alcohol  use: Yes    Alcohol /week: 6.0 standard drinks of alcohol     Types: 6 Glasses of wine per week   Drug use: No   Sexual activity: Not on file  Other Topics Concern   Not on file  Social History Narrative   Not on file   Social Drivers of Health   Financial Resource Strain: Not on file  Food Insecurity: No Food Insecurity (03/06/2024)   Hunger Vital Sign    Worried About Running Out of Food in the Last Year: Never true    Ran Out of Food in the Last Year: Never true  Transportation Needs: No Transportation Needs (03/06/2024)   PRAPARE - Administrator, Civil Service (Medical): No    Lack of Transportation (Non-Medical): No  Physical Activity: Not on file  Stress: Not on file  Social Connections: Socially Isolated (03/06/2024)   Social Connection and Isolation Panel [NHANES]    Frequency of Communication with Friends and Family: More than three times a week    Frequency of Social Gatherings with Friends and Family: Once a week    Attends Religious Services: Never    Database administrator or Organizations: No    Attends Banker Meetings: Never    Marital Status: Divorced  Catering manager Violence: Not At Risk (03/06/2024)   Humiliation, Afraid, Rape,  and Kick questionnaire    Fear of Current or Ex-Partner: No    Emotionally Abused: No    Physically Abused: No    Sexually Abused: No     Code Status   Code Status: Full Code  Review of Systems: All systems reviewed and negative except where noted in HPI.  Physical Exam: Vital signs in last 24 hours: Temp:  [97.5 F (36.4 C)-98.7 F (37.1 C)] 97.5 F (36.4 C) (05/01 1057) Pulse Rate:  [53-100] 53 (05/01 1057) Resp:  [15-27] 19 (05/01 1057) BP: (96-127)/(36-79) 115/56 (05/01 1057) SpO2:  [94 %-100 %] 95 % (05/01 1057) Weight:  [71 kg-71.9 kg] 71.9 kg (05/01 0343) Last BM Date : 03/06/24  General:  Pleasant female in NAD Psych:  Cooperative. Normal mood and affect Eyes: Pupils equal Ears:  Normal auditory acuity Nose: No deformity, discharge or lesions Neck:  Supple, no masses felt Lungs:  Clear to auscultation.  Heart:  Regular rate, regular rhythm.  Abdomen:  Soft, nondistended, nontender, active bowel sounds, no masses felt Rectal :  Deferred Msk: Symmetrical without gross deformities.  Neurologic:  Alert, oriented, grossly normal neurologically Extremities : No edema Skin:  Intact without significant lesions.    Intake/Output from previous day: No intake/output data recorded. Intake/Output this shift:  Total I/O In: 315 [Blood:315] Out: -    Mai Schwalbe, NP-C   03/06/2024, 11:07 AM

## 2024-03-07 ENCOUNTER — Encounter (HOSPITAL_COMMUNITY)
Admission: EM | Disposition: A | Discharge status: Home / Self Care | Payer: Self-pay | Source: Home / Self Care | Attending: Internal Medicine

## 2024-03-07 ENCOUNTER — Encounter (HOSPITAL_COMMUNITY): Payer: Self-pay | Admitting: Internal Medicine

## 2024-03-07 ENCOUNTER — Observation Stay (HOSPITAL_COMMUNITY): Payer: Self-pay | Admitting: Certified Registered"

## 2024-03-07 DIAGNOSIS — K921 Melena: Secondary | ICD-10-CM | POA: Diagnosis not present

## 2024-03-07 DIAGNOSIS — K222 Esophageal obstruction: Secondary | ICD-10-CM

## 2024-03-07 DIAGNOSIS — Y838 Other surgical procedures as the cause of abnormal reaction of the patient, or of later complication, without mention of misadventure at the time of the procedure: Secondary | ICD-10-CM | POA: Diagnosis not present

## 2024-03-07 DIAGNOSIS — S1121XA Laceration without foreign body of pharynx and cervical esophagus, initial encounter: Secondary | ICD-10-CM

## 2024-03-07 DIAGNOSIS — G8929 Other chronic pain: Secondary | ICD-10-CM | POA: Diagnosis not present

## 2024-03-07 DIAGNOSIS — K5732 Diverticulitis of large intestine without perforation or abscess without bleeding: Secondary | ICD-10-CM | POA: Diagnosis not present

## 2024-03-07 DIAGNOSIS — K5792 Diverticulitis of intestine, part unspecified, without perforation or abscess without bleeding: Secondary | ICD-10-CM | POA: Diagnosis not present

## 2024-03-07 DIAGNOSIS — G4733 Obstructive sleep apnea (adult) (pediatric): Secondary | ICD-10-CM | POA: Diagnosis not present

## 2024-03-07 DIAGNOSIS — I4891 Unspecified atrial fibrillation: Secondary | ICD-10-CM | POA: Diagnosis not present

## 2024-03-07 DIAGNOSIS — F419 Anxiety disorder, unspecified: Secondary | ICD-10-CM | POA: Diagnosis not present

## 2024-03-07 DIAGNOSIS — K449 Diaphragmatic hernia without obstruction or gangrene: Secondary | ICD-10-CM

## 2024-03-07 DIAGNOSIS — Z8619 Personal history of other infectious and parasitic diseases: Secondary | ICD-10-CM | POA: Diagnosis not present

## 2024-03-07 DIAGNOSIS — Z7901 Long term (current) use of anticoagulants: Secondary | ICD-10-CM | POA: Diagnosis not present

## 2024-03-07 DIAGNOSIS — I10 Essential (primary) hypertension: Secondary | ICD-10-CM | POA: Diagnosis not present

## 2024-03-07 DIAGNOSIS — F17210 Nicotine dependence, cigarettes, uncomplicated: Secondary | ICD-10-CM | POA: Diagnosis not present

## 2024-03-07 DIAGNOSIS — Z8249 Family history of ischemic heart disease and other diseases of the circulatory system: Secondary | ICD-10-CM | POA: Diagnosis not present

## 2024-03-07 DIAGNOSIS — K649 Unspecified hemorrhoids: Secondary | ICD-10-CM | POA: Diagnosis not present

## 2024-03-07 DIAGNOSIS — E7800 Pure hypercholesterolemia, unspecified: Secondary | ICD-10-CM | POA: Diagnosis not present

## 2024-03-07 DIAGNOSIS — Z860100 Personal history of colon polyps, unspecified: Secondary | ICD-10-CM | POA: Diagnosis not present

## 2024-03-07 DIAGNOSIS — D7589 Other specified diseases of blood and blood-forming organs: Secondary | ICD-10-CM | POA: Diagnosis not present

## 2024-03-07 DIAGNOSIS — R109 Unspecified abdominal pain: Secondary | ICD-10-CM | POA: Diagnosis not present

## 2024-03-07 DIAGNOSIS — K625 Hemorrhage of anus and rectum: Secondary | ICD-10-CM | POA: Diagnosis not present

## 2024-03-07 DIAGNOSIS — Z604 Social exclusion and rejection: Secondary | ICD-10-CM | POA: Diagnosis present

## 2024-03-07 DIAGNOSIS — K91840 Postprocedural hemorrhage and hematoma of a digestive system organ or structure following a digestive system procedure: Secondary | ICD-10-CM | POA: Diagnosis not present

## 2024-03-07 DIAGNOSIS — Z888 Allergy status to other drugs, medicaments and biological substances status: Secondary | ICD-10-CM | POA: Diagnosis not present

## 2024-03-07 DIAGNOSIS — Z79899 Other long term (current) drug therapy: Secondary | ICD-10-CM | POA: Diagnosis not present

## 2024-03-07 DIAGNOSIS — Z95818 Presence of other cardiac implants and grafts: Secondary | ICD-10-CM | POA: Diagnosis not present

## 2024-03-07 DIAGNOSIS — K2289 Other specified disease of esophagus: Secondary | ICD-10-CM

## 2024-03-07 DIAGNOSIS — R1013 Epigastric pain: Secondary | ICD-10-CM | POA: Diagnosis not present

## 2024-03-07 DIAGNOSIS — F32A Depression, unspecified: Secondary | ICD-10-CM | POA: Diagnosis not present

## 2024-03-07 DIAGNOSIS — K922 Gastrointestinal hemorrhage, unspecified: Secondary | ICD-10-CM | POA: Diagnosis not present

## 2024-03-07 DIAGNOSIS — D62 Acute posthemorrhagic anemia: Secondary | ICD-10-CM | POA: Diagnosis not present

## 2024-03-07 DIAGNOSIS — I480 Paroxysmal atrial fibrillation: Secondary | ICD-10-CM | POA: Diagnosis not present

## 2024-03-07 DIAGNOSIS — D539 Nutritional anemia, unspecified: Secondary | ICD-10-CM | POA: Diagnosis not present

## 2024-03-07 HISTORY — PX: ESOPHAGOGASTRODUODENOSCOPY: SHX5428

## 2024-03-07 LAB — GLUCOSE, CAPILLARY: Glucose-Capillary: 95 mg/dL (ref 70–99)

## 2024-03-07 LAB — BPAM RBC
Blood Product Expiration Date: 202505072359
ISSUE DATE / TIME: 202505010702
Unit Type and Rh: 9500

## 2024-03-07 LAB — BASIC METABOLIC PANEL WITH GFR
Anion gap: 10 (ref 5–15)
BUN: 5 mg/dL — ABNORMAL LOW (ref 8–23)
CO2: 20 mmol/L — ABNORMAL LOW (ref 22–32)
Calcium: 8.3 mg/dL — ABNORMAL LOW (ref 8.9–10.3)
Chloride: 110 mmol/L (ref 98–111)
Creatinine, Ser: 0.75 mg/dL (ref 0.44–1.00)
GFR, Estimated: >60 mL/min (ref >60)
Glucose, Bld: 87 mg/dL (ref 70–99)
Potassium: 3.5 mmol/L (ref 3.5–5.1)
Sodium: 140 mmol/L (ref 135–145)

## 2024-03-07 LAB — CBC
HCT: 28.9 % — ABNORMAL LOW (ref 36.0–46.0)
HCT: 28.6 % — ABNORMAL LOW (ref 36.0–46.0)
Hemoglobin: 9.4 g/dL — ABNORMAL LOW (ref 12.0–15.0)
Hemoglobin: 9.2 g/dL — ABNORMAL LOW (ref 12.0–15.0)
MCH: 32.9 pg (ref 26.0–34.0)
MCH: 32.9 pg (ref 26.0–34.0)
MCHC: 32.5 g/dL (ref 30.0–36.0)
MCHC: 32.2 g/dL (ref 30.0–36.0)
MCV: 101 fL — ABNORMAL HIGH (ref 80.0–100.0)
MCV: 102.1 fL — ABNORMAL HIGH (ref 80.0–100.0)
Platelets: 193 10*3/uL (ref 150–400)
Platelets: 225 10*3/uL (ref 150–400)
RBC: 2.86 MIL/uL — ABNORMAL LOW (ref 3.87–5.11)
RBC: 2.8 MIL/uL — ABNORMAL LOW (ref 3.87–5.11)
RDW: 19.6 % — ABNORMAL HIGH (ref 11.5–15.5)
RDW: 18.6 % — ABNORMAL HIGH (ref 11.5–15.5)
WBC: 6.9 10*3/uL (ref 4.0–10.5)
WBC: 8.7 10*3/uL (ref 4.0–10.5)
nRBC: 0 % (ref 0.0–0.2)
nRBC: 0 % (ref 0.0–0.2)

## 2024-03-07 LAB — TYPE AND SCREEN
ABO/RH(D): O NEG
Antibody Screen: NEGATIVE
Unit division: 0

## 2024-03-07 LAB — APTT: aPTT: 63 s — ABNORMAL HIGH (ref 24–36)

## 2024-03-07 LAB — HEPARIN LEVEL (UNFRACTIONATED): Heparin Unfractionated: 0.49 [IU]/mL (ref 0.30–0.70)

## 2024-03-07 SURGERY — EGD (ESOPHAGOGASTRODUODENOSCOPY)
Anesthesia: Monitor Anesthesia Care

## 2024-03-07 MED ORDER — PROPOFOL 500 MG/50ML IV EMUL
INTRAVENOUS | Status: DC | PRN
  Administered 2024-03-07: 100 ug/kg/min via INTRAVENOUS

## 2024-03-07 MED ORDER — PROPOFOL 10 MG/ML IV BOLUS
INTRAVENOUS | Status: DC | PRN
  Administered 2024-03-07: 40 mg via INTRAVENOUS
  Administered 2024-03-07: 20 mg via INTRAVENOUS

## 2024-03-07 MED ORDER — PANTOPRAZOLE SODIUM 40 MG PO TBEC
40.0000 mg | DELAYED_RELEASE_TABLET | Freq: Two times a day (BID) | ORAL | Status: DC
  Administered 2024-03-07 – 2024-03-08 (×2): 40 mg via ORAL
  Filled 2024-03-07 (×2): qty 1

## 2024-03-07 MED ORDER — SODIUM CHLORIDE 0.9 % IV SOLN: INTRAVENOUS | Status: DC | PRN

## 2024-03-07 MED ORDER — HEPARIN (PORCINE) 25000 UT/250ML-% IV SOLN
1100.0000 [IU]/h | INTRAVENOUS | Status: DC
  Administered 2024-03-07: 950 [IU]/h via INTRAVENOUS
  Filled 2024-03-07: qty 250

## 2024-03-07 MED ORDER — LIDOCAINE 2% (20 MG/ML) 5 ML SYRINGE
INTRAMUSCULAR | Status: DC | PRN
  Administered 2024-03-07: 40 mg via INTRAVENOUS

## 2024-03-07 NOTE — Progress Notes (Signed)
  Patient Name: Theresa Reynolds Date of Encounter: 03/07/2024  Primary Cardiologist: None Electrophysiologist: Boyce Byes, MD  Interval Summary   The patient is doing well today.  At this time, the patient denies chest pain, shortness of breath, or any new concerns.  Vital Signs    Vitals:   03/06/24 2026 03/06/24 2323 03/07/24 0400 03/07/24 0522  BP: (!) 131/52 (!) 134/55 (!) 128/55 130/60  Pulse: (!) 51 (!) 52 (!) 49 (!) 55  Resp: (!) 24 15 14  (!) 25  Temp: 97.9 F (36.6 C) 98.7 F (37.1 C)  98.7 F (37.1 C)  TempSrc: Oral Oral  Oral  SpO2: 96% 93% 94% 97%  Weight:      Height:        Intake/Output Summary (Last 24 hours) at 03/07/2024 1152 Last data filed at 03/06/2024 2233 Gross per 24 hour  Intake 1340 ml  Output --  Net 1340 ml   Filed Weights   03/05/24 1800 03/05/24 2146 03/06/24 0343  Weight: 71 kg 71 kg 71.9 kg    Physical Exam    GEN- The patient is well appearing, alert and oriented x 3 today.   Lungs- Clear to ausculation bilaterally, normal work of breathing Cardiac- Regular rate and rhythm, no murmurs, rubs or gallops GI- soft, NT, ND, + BS Extremities- no clubbing or cyanosis. No edema  Telemetry    Sinus brady 50s (personally reviewed)  Hospital Course    Theresa Reynolds is a 73 y.o. female with a history of AF and recent watchman device who is being seen today for the evaluation of Acute GI bleed at the request of Dr. Alvis Ba.   Assessment & Plan    Acute GI bleeding PLT  193 (05/02 0542) HGB  9.4* (05/02 0542)  Pending EGD Stools now brown.    Given higher risk of thromboembolic events for 6 weeks post Watchman, hope to limit anticoagulation interruption as much as possible. Was briefly on heparin  overnight.    PAF S/p Watchman device Maintaining NSR currently.  High risk of thromboembolic events immediately post watchman. Hope to limit OAC interruption as much as possible as above.   For questions or updates, please contact  [facility] Please consult [email or web address] for contact info under Cardiology/STEMI.  Signed, Tylene Galla, PA-C  03/07/2024, 11:52 AM

## 2024-03-07 NOTE — Progress Notes (Addendum)
 PROGRESS NOTE    Theresa Reynolds  ZOX:[id number] DOB: 04/04/1951 DOA: 03/05/2024 PCP: Alejandro Hurt, FNP    Chief Complaint  Patient presents with   Abnormal Labs    Brief Narrative:    Theresa Reynolds is a 73 y.o. female with history of atrial fibrillation status post recent Watchman device placement, hypertension hyperlipidemia, depression anxiety, tobacco abuse was requested to come to the ER after patient's blood work done at the office showed low hemoglobin around 8.  Patient had recent Watchman device placement for known history of A-fib with prior recurrent GI bleed.  Patient reports melena, with significant drop in her hemoglobin, so she was admitted for further management.   Assessment & Plan:   Principal Problem:   Acute GI bleeding Active Problems:   Paroxysmal atrial fibrillation (HCC)   OSA (obstructive sleep apnea)   Tobacco use   ABLA (acute blood loss anemia)   Diverticulitis   Depression with anxiety   Melena  Acute GI bleeding Acute blood loss anemia Symptomatic anemia -Is most likely due to upper GI bleed in the setting of melena. - Continue to monitor and transfuse as needed, 2 unit PRBC yesterday, with good response, hemoglobin remained stable over last 24 hours, brown-colored stools, so rechallenging with heparin  GTT as high risk.  - Continue with IV Protonix  -GI input greatly appreciated, for endoscopy today.  Addendum: EGD significant for consulted on esophagus, recommendation for close monitoring on heparin  drip as high risk for bleed, so we will resume on heparin  drip this evening, monitor, and if he remains stable then transition to Eliquis .    Atrial fibrillation status post recent Watchman device placement  - High risk for stroke, will start on anticoagulation once, for now we will keep holding Eliquis . -Continue with heparin  GTT for now given hemoglobin stable pending her endoscopy  Acute sigmoid diverticulitis  -on  antibiotics.  Hyperlipidemia - takes statins.  History of depression anxiety  -on Zoloft  and as needed Ativan .  Tobacco abuse  -advised about quitting smoking.  Sleep apnea  -on CPAP at bedtime.    DVT prophylaxis: heparin  GTT Code Status: Full Family Communication: Disposition:   Status is: Observation    Consultants:  Cardiology Gastroenterology  Subjective: No significant events overnight, he had brown color BM yesterday, no melena  Objective: Vitals:   03/06/24 2026 03/06/24 2323 03/07/24 0400 03/07/24 0522  BP: (!) 131/52 (!) 134/55 (!) 128/55 130/60  Pulse: (!) 51 (!) 52 (!) 49 (!) 55  Resp: (!) 24 15 14  (!) 25  Temp: 97.9 F (36.6 C) 98.7 F (37.1 C)  98.7 F (37.1 C)  TempSrc: Oral Oral  Oral  SpO2: 96% 93% 94% 97%  Weight:      Height:        Intake/Output Summary (Last 24 hours) at 03/07/2024 1022 Last data filed at 03/06/2024 2233 Gross per 24 hour  Intake 1655 ml  Output --  Net 1655 ml   Filed Weights   03/05/24 1800 03/05/24 2146 03/06/24 0343  Weight: 71 kg 71 kg 71.9 kg    Examination:  Awake Alert, Oriented X 3, No new F.N deficits, Normal affect Symmetrical Chest wall movement, Good air movement bilaterally, CTAB RRR,No Gallops,Rubs or new Murmurs, No Parasternal Heave +ve B.Sounds, Abd Soft, No tenderness, No rebound - guarding or rigidity. No Cyanosis, Clubbing or edema, No new Rash or bruise       Data Reviewed: I have personally reviewed following labs and imaging studies  CBC: Recent Labs  Lab 03/03/24 0800 03/05/24 1807 03/05/24 2235 03/06/24 0100 03/06/24 0439 03/06/24 1245 03/06/24 2309 03/07/24 0542  WBC 9.1 9.3   < > 9.0 8.6 8.5 8.1 6.9  NEUTROABS 6.8 5.9  --   --   --   --   --   --   HGB 8.5* 8.2*   < > 7.3* 7.3* 9.4* 9.1* 9.4*  HCT 26.3* 25.7*   < > 23.0* 22.8* 29.1* 27.7* 28.9*  MCV 108* 109.4*   < > 109.5* 108.1* 101.0* 101.1* 101.0*  PLT 180 231   < > 216 197 220 194 193   < > = values in this  interval not displayed.    Basic Metabolic Panel: Recent Labs  Lab 03/03/24 0800 03/05/24 1807 03/06/24 0439 03/07/24 0542  NA 137 135 137 140  K 3.5 3.2* 3.7 3.5  CL 104 103 107 110  CO2 20 20* 21* 20*  GLUCOSE 148* 121* 100* 87  BUN 10 7* 7* 5*  CREATININE 0.81 0.71 0.77 0.75  CALCIUM 8.9 9.0 8.3* 8.3*    GFR: Estimated Creatinine Clearance: 58.4 mL/min (by C-G formula based on SCr of 0.75 mg/dL).  Liver Function Tests: Recent Labs  Lab 03/05/24 1807 03/06/24 0439  AST 25 21  ALT 16 14  ALKPHOS 55 46  BILITOT 0.4 0.5  PROT 6.5 5.6*  ALBUMIN 3.3* 2.8*    CBG: Recent Labs  Lab 03/06/24 0059 03/06/24 0623 03/06/24 2325 03/07/24 0521  GLUCAP 100* 106* 103* 95     No results found for this or any previous visit (from the past 240 hours).       Radiology Studies: CT ABDOMEN PELVIS W CONTRAST Result Date: 03/05/2024 CLINICAL DATA:  Abdominal pain EXAM: CT ABDOMEN AND PELVIS WITH CONTRAST TECHNIQUE: Multidetector CT imaging of the abdomen and pelvis was performed using the standard protocol following bolus administration of intravenous contrast. RADIATION DOSE REDUCTION: This exam was performed according to the departmental dose-optimization program which includes automated exposure control, adjustment of the mA and/or kV according to patient size and/or use of iterative reconstruction technique. CONTRAST:  75mL OMNIPAQUE  IOHEXOL  350 MG/ML SOLN COMPARISON:  None Available. FINDINGS: Lower chest: No acute pleural or parenchymal lung disease. Hepatobiliary: No focal liver abnormality is seen. No gallstones, gallbladder wall thickening, or biliary dilatation. Pancreas: Unremarkable. No pancreatic ductal dilatation or surrounding inflammatory changes. Spleen: Normal in size without focal abnormality. Adrenals/Urinary Tract: Kidneys are unremarkable without urinary tract calculi or obstructive uropathy. The adrenals and bladder are normal. Stomach/Bowel: No bowel  obstruction or ileus. Diverticulosis of the sigmoid colon, with segmental wall thickening and pericolonic fat stranding consistent with acute diverticulitis. No perforation, fluid collection, or abscess. Normal appendix right lower quadrant. Vascular/Lymphatic: Aortic atherosclerosis. No enlarged abdominal or pelvic lymph nodes. Reproductive: Uterus and bilateral adnexa are unremarkable. Other: No free fluid or free intraperitoneal gas. No abdominal wall hernia. Musculoskeletal: No acute or destructive bony abnormalities. Reconstructed images demonstrate no additional findings. IMPRESSION: 1. Acute uncomplicated sigmoid diverticulitis. No perforation, fluid collection, or abscess. 2.  Aortic Atherosclerosis (ICD10-I70.0). Electronically Signed   By: Bobbye Burrow M.D.   On: 03/05/2024 21:31        Scheduled Meds:  pantoprazole  (PROTONIX ) IV  40 mg Intravenous Q6H   sertraline   150 mg Oral Daily   simvastatin   20 mg Oral QHS   traZODone   100 mg Oral QHS   Continuous Infusions:  cefTRIAXone  (ROCEPHIN )  IV 2 g (03/06/24 1104)  heparin  Stopped (03/07/24 0746)   metronidazole  500 mg (03/07/24 1008)     LOS: 0 days      Seena Dadds, MD [facility]   To contact the attending provider between 7A-7P or the covering provider during after hours 7P-7A, please log into the web site [email or web address] and access using universal  password for that web site. If you do not have the password, please call the hospital operator.  03/07/2024, 10:22 AM

## 2024-03-07 NOTE — Transfer of Care (Signed)
 Immediate Anesthesia Transfer of Care Note  Patient: Theresa Reynolds  Procedure(s) Performed: EGD (ESOPHAGOGASTRODUODENOSCOPY)  Patient Location: PACU and Endoscopy Unit  Anesthesia Type:MAC  Level of Consciousness: awake, alert , and oriented  Airway & Oxygen Therapy: Patient Spontanous Breathing  Post-op Assessment: Report given to RN and Post -op Vital signs reviewed and stable  Post vital signs: Reviewed and stable  Last Vitals:  Vitals Value Taken Time  BP    Temp    Pulse    Resp    SpO2      Last Pain:  Vitals:   03/07/24 1343  TempSrc: Temporal  PainSc: 0-No pain      Patients Stated Pain Goal: 0 (03/06/24 2234)  Complications: No notable events documented.

## 2024-03-07 NOTE — Interval H&P Note (Signed)
 History and Physical Interval Note: For EGD today to evaluate upper abdominal pain and melenic stool.  Eliquis  has remained on hold after recent watchman insertion The nature of the procedure, as well as the risks, benefits, and alternatives were carefully and thoroughly reviewed with the patient. Ample time for discussion and questions allowed. The patient understood, was satisfied, and agreed to proceed.      Latest Ref Rng & Units 03/07/2024    5:42 AM 03/06/2024   11:09 PM 03/06/2024   12:45 PM  CBC  WBC 4.0 - 10.5 K/uL 6.9  8.1  8.5   Hemoglobin 12.0 - 15.0 g/dL 9.4  9.1  9.4   Hematocrit 36.0 - 46.0 % 28.9  27.7  29.1   Platelets 150 - 400 K/uL 193  194  220      03/07/2024 2:14 PM  Theresa Reynolds  has presented today for surgery, with the diagnosis of Upper GI bleed.  The various methods of treatment have been discussed with the patient and family. After consideration of risks, benefits and other options for treatment, the patient has consented to  Procedure(s): EGD (ESOPHAGOGASTRODUODENOSCOPY) (N/A) as a surgical intervention.  The patient's history has been reviewed, patient examined, no change in status, stable for surgery.  I have reviewed the patient's chart and labs.  Questions were answered to the patient's satisfaction.     Amber Bail [name]

## 2024-03-07 NOTE — Plan of Care (Signed)

## 2024-03-07 NOTE — Progress Notes (Signed)
 PHARMACY - ANTICOAGULATION CONSULT NOTE  Pharmacy Consult for heparin  Indication: atrial fibrillation  Labs: Recent Labs    03/05/24 1807 03/05/24 2235 03/06/24 0439 03/06/24 1245 03/06/24 2309 03/07/24 0542  HGB 8.2*   < > 7.3* 9.4* 9.1* 9.4*  HCT 25.7*   < > 22.8* 29.1* 27.7* 28.9*  PLT 231   < > 197 220 194 193  APTT  --   --   --   --   --  63*  LABPROT 16.1*  --   --   --   --   --   INR 1.3*  --   --   --   --   --   HEPARINUNFRC  --   --   --   --   --  0.49  CREATININE 0.71  --  0.77  --   --  0.75   < > = values in this interval not displayed.   Assessment: 73yo female  on heparin  while DOAC on hold for Afib S/p EGD with plans to resume heparin     Goal of Therapy:  aPTT 66-85 seconds   Plan:  Resume heparin  at 950 units / hr at 6 pm Check level in 8 hours.   Thank you. Lennice Quivers, PharmD 03/07/2024 3:45 PM

## 2024-03-07 NOTE — Op Note (Signed)
 [facility] Patient Name: Theresa Reynolds Procedure Date : 03/07/2024 MRN: [id number] Attending MD: Nannette Babe , MD, [id number] Date of Birth: [birth year] CSN: [id number] Age: 73 Admit Type: Inpatient Procedure:                Upper GI endoscopy Indications:              Epigastric abdominal pain, Melena Providers:                Amber Bail. Bridgett Camps, MD, Ila Malay, RN, Nohemi Batters,                            Technician Referring MD:             [facility] Medicines:                Monitored Anesthesia Care Complications:            No immediate complications. Estimated Blood Loss:     Estimated blood loss: none. Procedure:                Pre-Anesthesia Assessment:                           - Prior to the procedure, a History and Physical                            was performed, and patient medications and                            allergies were reviewed. The patient's tolerance of                            previous anesthesia was also reviewed. The risks                            and benefits of the procedure and the sedation                            options and risks were discussed with the patient.                            All questions were answered, and informed consent                            was obtained. Prior Anticoagulants: The patient has                            taken heparin , last dose was day of procedure. ASA                            Grade Assessment: III - A patient with severe                            systemic disease. After reviewing the risks and  benefits, the patient was deemed in satisfactory                            condition to undergo the procedure.                           After obtaining informed consent, the endoscope was                            passed under direct vision. Throughout the                            procedure, the patient's blood pressure, pulse, and                             oxygen saturations were monitored continuously. The                            GIF-H190 ([id number]) Olympus endoscope was introduced                            through the mouth, and advanced to the second part                            of duodenum. The upper GI endoscopy was                            accomplished without difficulty. The patient                            tolerated the procedure well. Scope In: Scope Out: Findings:      A non-bleeding mucosal tear/rent that was pre-existing (pre-procedure)       was found in the distal esophagus. This measured 10 mm in length.      A non-obstructing Schatzki ring was found at the gastroesophageal       junction.      A 2 cm hiatal hernia was present.      The entire examined stomach was normal.      The examined duodenum was normal. Impression:               - Mucosal tear in the distal esophagus. Etiology                            unknown, query from recent transesophageal                            echocardiogram Likely source of melena, low risk                            for rebleeding now.                           - Non-obstructing Schatzki ring.                           -  2 cm hiatal hernia.                           - Normal stomach.                           - Normal examined duodenum.                           - No specimens collected. Moderate Sedation:      N/A Recommendation:           - Return patient to hospital ward for ongoing care.                           - Advance diet as tolerated.                           - Continue present medications.                           - Okay to resume heparin  infusion.                           - BID PPI x 4 weeks.                           - Monitor Hgb closely.                           - Call if GI can be of further assistance. Procedure Code(s):        --- Professional ---                           [phone], Esophagogastroduodenoscopy, flexible,                             transoral; diagnostic, including collection of                            specimen(s) by brushing or washing, when performed                            (separate procedure) Diagnosis Code(s):        --- Professional ---                           W10.272Z, Other injury of esophagus (thoracic                            part), initial encounter                           K22.2, Esophageal obstruction                           K44.9, Diaphragmatic hernia without obstruction or  gangrene                           R10.13, Epigastric pain                           K92.1, Melena (includes Hematochezia) CPT copyright 2022 [employer]. All rights reserved. The codes documented in this report are preliminary and upon coder review may  be revised to meet current compliance requirements. Nannette Babe, MD 03/07/2024 3:10:58 PM This report has been signed electronically. Number of Addenda: 0

## 2024-03-07 NOTE — Progress Notes (Signed)
 PHARMACY - ANTICOAGULATION CONSULT NOTE  Pharmacy Consult for heparin  Indication: atrial fibrillation  Labs: Recent Labs    03/05/24 1807 03/05/24 2235 03/06/24 0439 03/06/24 1245 03/06/24 2309 03/07/24 0542  HGB 8.2*   < > 7.3* 9.4* 9.1* 9.4*  HCT 25.7*   < > 22.8* 29.1* 27.7* 28.9*  PLT 231   < > 197 220 194 193  APTT  --   --   --   --   --  63*  LABPROT 16.1*  --   --   --   --   --   INR 1.3*  --   --   --   --   --   HEPARINUNFRC  --   --   --   --   --  0.49  CREATININE 0.71  --  0.77  --   --   --    < > = values in this interval not displayed.   Assessment: 73yo female slightly subtherapeutic on heparin  with initial dosing while DOAC on hold; no infusion issues or signs of bleeding per RN.  Goal of Therapy:  aPTT 66-85 seconds   Plan:  Increase heparin  infusion by 1-2 units/kg/hr to 950 units/hr. Check level in 8 hours.   Lonnie Roberts, PharmD, BCPS 03/07/2024 7:10 AM

## 2024-03-07 NOTE — Anesthesia Preprocedure Evaluation (Addendum)
 Anesthesia Evaluation  Patient identified by MRN, date of birth, ID band Patient awake    Reviewed: Allergy & Precautions, NPO status , Patient's Chart, lab work & pertinent test results  Airway Mallampati: II  TM Distance: >3 FB Neck ROM: Full    Dental no notable dental hx.    Pulmonary sleep apnea , Current Smoker and Patient abstained from smoking.   Pulmonary exam normal        Cardiovascular hypertension, Pt. on home beta blockers Normal cardiovascular exam+ dysrhythmias Atrial Fibrillation      Neuro/Psych  PSYCHIATRIC DISORDERS Anxiety Depression     Neuromuscular disease    GI/Hepatic negative GI ROS,,,(+) Hepatitis -, C  Endo/Other  negative endocrine ROS    Renal/GU negative Renal ROS     Musculoskeletal negative musculoskeletal ROS (+)    Abdominal   Peds  Hematology  (+) Blood dyscrasia (Eliquis ), anemia   Anesthesia Other Findings Upper GI bleed  Reproductive/Obstetrics                             Anesthesia Physical Anesthesia Plan  ASA: 3  Anesthesia Plan: MAC   Post-op Pain Management:    Induction:   PONV Risk Score and Plan: 1 and Propofol  infusion and Treatment may vary due to age or medical condition  Airway Management Planned: Nasal Cannula  Additional Equipment:   Intra-op Plan:   Post-operative Plan:   Informed Consent: I have reviewed the patients History and Physical, chart, labs and discussed the procedure including the risks, benefits and alternatives for the proposed anesthesia with the patient or authorized representative who has indicated his/her understanding and acceptance.     Dental advisory given  Plan Discussed with: CRNA  Anesthesia Plan Comments:        Anesthesia Quick Evaluation

## 2024-03-08 ENCOUNTER — Other Ambulatory Visit (HOSPITAL_COMMUNITY): Payer: Self-pay

## 2024-03-08 DIAGNOSIS — K922 Gastrointestinal hemorrhage, unspecified: Secondary | ICD-10-CM | POA: Diagnosis not present

## 2024-03-08 LAB — CBC
HCT: 29.3 % — ABNORMAL LOW (ref 36.0–46.0)
Hemoglobin: 9.2 g/dL — ABNORMAL LOW (ref 12.0–15.0)
MCH: 32.3 pg (ref 26.0–34.0)
MCHC: 31.4 g/dL (ref 30.0–36.0)
MCV: 102.8 fL — ABNORMAL HIGH (ref 80.0–100.0)
Platelets: 220 10*3/uL (ref 150–400)
RBC: 2.85 MIL/uL — ABNORMAL LOW (ref 3.87–5.11)
RDW: 18.4 % — ABNORMAL HIGH (ref 11.5–15.5)
WBC: 8 10*3/uL (ref 4.0–10.5)
nRBC: 0 % (ref 0.0–0.2)

## 2024-03-08 LAB — APTT: aPTT: 66 s — ABNORMAL HIGH (ref 24–36)

## 2024-03-08 LAB — BASIC METABOLIC PANEL WITH GFR
Anion gap: 9 (ref 5–15)
BUN: 7 mg/dL — ABNORMAL LOW (ref 8–23)
CO2: 20 mmol/L — ABNORMAL LOW (ref 22–32)
Calcium: 8.5 mg/dL — ABNORMAL LOW (ref 8.9–10.3)
Chloride: 110 mmol/L (ref 98–111)
Creatinine, Ser: 0.83 mg/dL (ref 0.44–1.00)
GFR, Estimated: >60 mL/min (ref >60)
Glucose, Bld: 93 mg/dL (ref 70–99)
Potassium: 3.4 mmol/L — ABNORMAL LOW (ref 3.5–5.1)
Sodium: 139 mmol/L (ref 135–145)

## 2024-03-08 LAB — HEPARIN LEVEL (UNFRACTIONATED)
Heparin Unfractionated: 0.24 [IU]/mL — ABNORMAL LOW (ref 0.30–0.70)
Heparin Unfractionated: 0.34 [IU]/mL (ref 0.30–0.70)

## 2024-03-08 MED ORDER — AMOXICILLIN-POT CLAVULANATE 875-125 MG PO TABS
1.0000 | ORAL_TABLET | Freq: Two times a day (BID) | ORAL | 0 refills | Status: AC
  Filled 2024-03-08: qty 8, 4d supply, fill #0

## 2024-03-08 MED ORDER — AMOXICILLIN-POT CLAVULANATE 875-125 MG PO TABS: 1.0000 | ORAL_TABLET | Freq: Two times a day (BID) | ORAL | Status: DC

## 2024-03-08 MED ORDER — APIXABAN 5 MG PO TABS
5.0000 mg | ORAL_TABLET | Freq: Two times a day (BID) | ORAL | Status: DC
  Administered 2024-03-08: 5 mg via ORAL
  Filled 2024-03-08: qty 1

## 2024-03-08 MED ORDER — POTASSIUM CHLORIDE CRYS ER 10 MEQ PO TBCR
30.0000 meq | EXTENDED_RELEASE_TABLET | Freq: Four times a day (QID) | ORAL | Status: AC
  Administered 2024-03-08 (×2): 30 meq via ORAL
  Filled 2024-03-08 (×2): qty 1

## 2024-03-08 MED ORDER — METOPROLOL SUCCINATE ER 50 MG PO TB24
50.0000 mg | ORAL_TABLET | Freq: Every day | ORAL | Status: DC
  Administered 2024-03-08: 50 mg via ORAL
  Filled 2024-03-08: qty 1

## 2024-03-08 MED ORDER — PANTOPRAZOLE SODIUM 40 MG PO TBEC
40.0000 mg | DELAYED_RELEASE_TABLET | Freq: Two times a day (BID) | ORAL | 0 refills | Status: AC
  Filled 2024-03-08: qty 60, 30d supply, fill #0

## 2024-03-08 NOTE — Plan of Care (Signed)

## 2024-03-08 NOTE — Progress Notes (Signed)
 PHARMACY - ANTICOAGULATION CONSULT NOTE  Pharmacy Consult for heparin  Indication: atrial fibrillation  Labs: Recent Labs    03/05/24 1807 03/05/24 2235 03/06/24 0439 03/06/24 1245 03/07/24 0542 03/07/24 2214 03/08/24 0204  HGB 8.2*   < > 7.3*   < > 9.4* 9.2* 9.2*  HCT 25.7*   < > 22.8*   < > 28.9* 28.6* 29.3*  PLT 231   < > 197   < > 193 225 220  APTT  --   --   --   --  63*  --  66*  LABPROT 16.1*  --   --   --   --   --   --   INR 1.3*  --   --   --   --   --   --   HEPARINUNFRC  --   --   --   --  0.49  --  0.24*  CREATININE 0.71  --  0.77  --  0.75  --  0.83   < > = values in this interval not displayed.   Assessment: 73yo female subtherapeutic on heparin  after resuming s/p EGD; DOAC effect on anti-Xa assay appears to be clearing; no infusion issues or signs of bleeding per RN.  Goal of Therapy:  Heparin  level 0.3-0.5 units/ml   Plan:  Increase heparin  infusion by 2 units/kg/hr to 1100 units/hr. Check level in 6 hours.   Lonnie Roberts, PharmD, BCPS 03/08/2024 5:05 AM

## 2024-03-08 NOTE — Discharge Instructions (Signed)
 Follow with Primary MD Alejandro Hurt, FNP in 7 days   Get CBC, CMP, 2 view  by Primary MD next visit.    Activity: As tolerated with Full fall precautions use walker/cane & assistance as needed   Disposition Home    Diet: Heart Healthy    On your next visit with your primary care physician please Get Medicines reviewed and adjusted.   Please request your Prim.MD to go over all Hospital Tests and Procedure/Radiological results at the follow up, please get all Hospital records sent to your Prim MD by signing hospital release before you go home.   If you experience worsening of your admission symptoms, develop shortness of breath, life threatening emergency, suicidal or homicidal thoughts you must seek medical attention immediately by calling 911 or calling your MD immediately  if symptoms less severe.  You Must read complete instructions/literature along with all the possible adverse reactions/side effects for all the Medicines you take and that have been prescribed to you. Take any new Medicines after you have completely understood and accpet all the possible adverse reactions/side effects.   Do not drive, operating heavy machinery, perform activities at heights, swimming or participation in water activities or provide baby sitting services if your were admitted for syncope or siezures until you have seen by Primary MD or a Neurologist and advised to do so again.  Do not drive when taking Pain medications.    Do not take more than prescribed Pain, Sleep and Anxiety Medications  Special Instructions: If you have smoked or chewed Tobacco  in the last 2 yrs please stop smoking, stop any regular Alcohol   and or any Recreational drug use.  Wear Seat belts while driving.   Please note  You were cared for by a hospitalist during your hospital stay. If you have any questions about your discharge medications or the care you received while you were in the hospital after you are  discharged, you can call the unit and asked to speak with the hospitalist on call if the hospitalist that took care of you is not available. Once you are discharged, your primary care physician will handle any further medical issues. Please note that NO REFILLS for any discharge medications will be authorized once you are discharged, as it is imperative that you return to your primary care physician (or establish a relationship with a primary care physician if you do not have one) for your aftercare needs so that they can reassess your need for medications and monitor your lab values.

## 2024-03-08 NOTE — Discharge Summary (Signed)
 Physician Discharge Summary  Theresa Reynolds WGN:[id number] DOB: [birth year] DOA: 03/05/2024  PCP: Alejandro Hurt, FNP  Admit date: 03/05/2024 Discharge date: 03/08/2024  Admitted From: (Home) Disposition:  Home   Recommendations for Outpatient Follow-up:  Follow up with PCP in 1-2 weeks Please obtain BMP/CBC in one week   Diet recommendation: Heart Healthy  Brief/Interim Summary:  Theresa Reynolds is a 73 y.o. female with history of atrial fibrillation status post recent Watchman device placement, hypertension hyperlipidemia, depression anxiety, tobacco abuse was requested to come to the ER after patient's blood work done at the office showed low hemoglobin around 8.  Patient had recent Watchman device placement for known history of A-fib with prior recurrent GI bleed.  Patient reports melena, with significant drop in her hemoglobin, so she was admitted for further management.    Acute GI bleeding Acute blood loss anemia Symptomatic anemia - upper GI bleed in the setting of melena. - Globin is low at 7.3, received 1 unit PRBC transfusion, overall with good response, she remained stable with hemoglobin around 9 over last 3 days . - He was monitored on heparin  gtt. during hospital stay. - GI input greatly appreciated, she went on 5/2 for EGD significant for mucosal tear on distal esophagus, unknown etiology, but query from recent TEE, likely this is the source of her melena, low risk for rebleeding, so she was changed again with heparin  GTT, hemoglobin remained stable, no further evidence of GI bleed, she was transition to Eliquis  prior to discharge.  .  Atrial fibrillation status post recent Watchman device placement  - High risk for stroke, so she was kept on heparin  gtt. most of her hospital stay, please see above discussion, she was transition to Eliquis  at time of discharge. - Resume home dose Toprol -XL  Acute sigmoid diverticulitis  - On IV Rocephin  and Flagyl  during hospital stay,  she will be discharged on oral Augmentin to finish total of 7 days treatment   Hyperlipidemia - takes statins.   History of depression anxiety  -on Zoloft  and as needed Ativan .   Tobacco abuse  -advised about quitting smoking.   Sleep apnea  -on CPAP at bedtime.  Discharge Diagnoses:  Principal Problem:   Acute GI bleeding Active Problems:   Paroxysmal atrial fibrillation (HCC)   OSA (obstructive sleep apnea)   Tobacco use   ABLA (acute blood loss anemia)   Diverticulitis   Depression with anxiety   Melena   Esophagus tear    Discharge Instructions  Discharge Instructions     Diet - low sodium heart healthy   Complete by: As directed    Discharge instructions   Complete by: As directed    Follow with Primary MD Alejandro Hurt, FNP in 7 days   Get CBC, CMP, 2 view  by Primary MD next visit.    Activity: As tolerated with Full fall precautions use walker/cane & assistance as needed   Disposition Home    Diet: Heart Healthy    On your next visit with your primary care physician please Get Medicines reviewed and adjusted.   Please request your Prim.MD to go over all Hospital Tests and Procedure/Radiological results at the follow up, please get all Hospital records sent to your Prim MD by signing hospital release before you go home.   If you experience worsening of your admission symptoms, develop shortness of breath, life threatening emergency, suicidal or homicidal thoughts you must seek medical attention immediately by calling 911 or calling  your MD immediately  if symptoms less severe.  You Must read complete instructions/literature along with all the possible adverse reactions/side effects for all the Medicines you take and that have been prescribed to you. Take any new Medicines after you have completely understood and accpet all the possible adverse reactions/side effects.   Do not drive, operating heavy machinery, perform activities at heights,  swimming or participation in water activities or provide baby sitting services if your were admitted for syncope or siezures until you have seen by Primary MD or a Neurologist and advised to do so again.  Do not drive when taking Pain medications.    Do not take more than prescribed Pain, Sleep and Anxiety Medications  Special Instructions: If you have smoked or chewed Tobacco  in the last 2 yrs please stop smoking, stop any regular Alcohol   and or any Recreational drug use.  Wear Seat belts while driving.   Please note  You were cared for by a hospitalist during your hospital stay. If you have any questions about your discharge medications or the care you received while you were in the hospital after you are discharged, you can call the unit and asked to speak with the hospitalist on call if the hospitalist that took care of you is not available. Once you are discharged, your primary care physician will handle any further medical issues. Please note that NO REFILLS for any discharge medications will be authorized once you are discharged, as it is imperative that you return to your primary care physician (or establish a relationship with a primary care physician if you do not have one) for your aftercare needs so that they can reassess your need for medications and monitor your lab values.   Increase activity slowly   Complete by: As directed       Allergies as of 03/08/2024       Reactions   Cymbalta [duloxetine Hcl] Other (See Comments)   Dizzy        Medication List     TAKE these medications    amoxicillin-clavulanate 875-125 MG tablet Commonly known as: AUGMENTIN Take 1 tablet by mouth every 12 (twelve) hours for 4 days.   apixaban  5 MG Tabs tablet Commonly known as: Eliquis  Take 1 tablet (5 mg total) by mouth 2 (two) times daily.   carboxymethylcellulose 1 % ophthalmic solution Place 1 drop into both eyes 2 (two) times daily as needed (dry/irrtated eyes).    fluticasone 50 MCG/ACT nasal spray Commonly known as: FLONASE Place 2 sprays into both nostrils daily as needed for allergies or rhinitis.   LORazepam  1 MG tablet Commonly known as: ATIVAN  Take 0.5-1 mg by mouth 2 (two) times daily as needed for anxiety.   metoprolol  succinate 50 MG 24 hr tablet Commonly known as: TOPROL -XL TAKE 1 TABLET BY MOUTH EVERY DAY WITH OR IMMEDIATELY FOLLOWING A MEAL   pantoprazole  40 MG tablet Commonly known as: PROTONIX  Take 1 tablet (40 mg total) by mouth 2 (two) times daily before a meal.   sertraline  100 MG tablet Commonly known as: ZOLOFT  Take 150 mg by mouth daily.   simvastatin  20 MG tablet Commonly known as: ZOCOR  Take 20 mg by mouth at bedtime.   traZODone  100 MG tablet Commonly known as: DESYREL  Take 100 mg by mouth at bedtime.        Allergies  Allergen Reactions   Cymbalta [Duloxetine Hcl] Other (See Comments)    Dizzy    Consultations: EP cardiology, GI  Procedures/Studies: CT ABDOMEN PELVIS W CONTRAST Result Date: 03/05/2024 CLINICAL DATA:  Abdominal pain EXAM: CT ABDOMEN AND PELVIS WITH CONTRAST TECHNIQUE: Multidetector CT imaging of the abdomen and pelvis was performed using the standard protocol following bolus administration of intravenous contrast. RADIATION DOSE REDUCTION: This exam was performed according to the departmental dose-optimization program which includes automated exposure control, adjustment of the mA and/or kV according to patient size and/or use of iterative reconstruction technique. CONTRAST:  75mL OMNIPAQUE  IOHEXOL  350 MG/ML SOLN COMPARISON:  None Available. FINDINGS: Lower chest: No acute pleural or parenchymal lung disease. Hepatobiliary: No focal liver abnormality is seen. No gallstones, gallbladder wall thickening, or biliary dilatation. Pancreas: Unremarkable. No pancreatic ductal dilatation or surrounding inflammatory changes. Spleen: Normal in size without focal abnormality. Adrenals/Urinary Tract:  Kidneys are unremarkable without urinary tract calculi or obstructive uropathy. The adrenals and bladder are normal. Stomach/Bowel: No bowel obstruction or ileus. Diverticulosis of the sigmoid colon, with segmental wall thickening and pericolonic fat stranding consistent with acute diverticulitis. No perforation, fluid collection, or abscess. Normal appendix right lower quadrant. Vascular/Lymphatic: Aortic atherosclerosis. No enlarged abdominal or pelvic lymph nodes. Reproductive: Uterus and bilateral adnexa are unremarkable. Other: No free fluid or free intraperitoneal gas. No abdominal wall hernia. Musculoskeletal: No acute or destructive bony abnormalities. Reconstructed images demonstrate no additional findings. IMPRESSION: 1. Acute uncomplicated sigmoid diverticulitis. No perforation, fluid collection, or abscess. 2.  Aortic Atherosclerosis (ICD10-I70.0). Electronically Signed   By: Bobbye Burrow M.D.   On: 03/05/2024 21:31   DG Chest 2 View Result Date: 03/05/2024 CLINICAL DATA:  Preop chest radiograph. History of paroxysmal atrial fibrillation. EXAM: CHEST - 2 VIEW COMPARISON:  09/19/2023. FINDINGS: Cardiac silhouette is normal in size and configuration. Stable loop recorder, left anterior chest wall. Normal mediastinal and hilar contours. Lungs are mildly hyperexpanded, but clear. No pleural effusion or pneumothorax. Skeletal structures are intact. IMPRESSION: No active cardiopulmonary disease. Electronically Signed   By: Amanda Jungling M.D.   On: 03/05/2024 11:54   EP STUDY Result Date: 02/28/2024 CONCLUSIONS: 1.Successful implantation of a WATCHMAN left atrial appendage occlusive device   2. TEE demonstrating no LAA thrombus 3. No early apparent complications. Post Implant Anticoagulation Strategy: Continue Eliquis  5mg  by mouth twice daily for 45 days. After 45 days, stop Eliquis  and start Plavix 75mg  by mouth twice daily to complete 6 months of post implant medical therapy. Plan for CT scan 60 days  after implant to assess appendage patency and Watchman position.   ECHO TEE Result Date: 02/28/2024    TRANSESOPHOGEAL ECHO REPORT   Patient Name:   BLANDINA CHAIRES Date of Exam: 02/28/2024 Medical Rec #:  [id number]       Height:       61.0 in Accession #:    [id number]      Weight:       158.0 lb Date of Birth:  07-07-1951        BSA:          1.709 m Patient Age:    73 years        BP:           147/60 mmHg Patient Gender: F               HR:           65 bpm. Exam Location:  Inpatient Procedure: Transesophageal Echo, 3D Echo, Color Doppler and Cardiac Doppler            (Both Spectral and Color Flow Doppler  were utilized during            procedure). Indications:     I48.0 Paroxysmal atrial fibrillation  History:         Patient has prior history of Echocardiogram examinations, most                  recent 01/07/2024. Arrythmias:Atrial Fibrillation; Risk                  Factors:Sleep Apnea, Hypertension and Dyslipidemia.  Sonographer:     Sherline Distel Senior RDCS Referring Phys:  [id number] Boyce Byes Diagnosing Phys: Janelle Mediate MD  Sonographer Comments: Watchman PROCEDURE: After discussion of the risks and benefits of a TEE, an informed consent was obtained from the patient. The transesophogeal probe was passed without difficulty through the esophogus of the patient. Sedation performed by different physician. The patient was monitored while under deep sedation. The patient developed no complications during the procedure.  IMPRESSIONS  1. Large chicken wing appendage No thrombus. 40 mm FLX device somewhat canted position with small initial butter along coumadin ridge. Negative tugg test Color flow with no leak past fabric after device warmed and released. Average compression 26%.  2. Left ventricular ejection fraction, by estimation, is 55 to 60%. The left ventricle has normal function.  3. Right ventricular systolic function is normal. The right ventricular size is normal.  4. Left atrial size was mildly dilated.  No left atrial/left atrial appendage thrombus was detected.  5. The mitral valve is abnormal. Mild mitral valve regurgitation.  6. The aortic valve is tricuspid. There is mild calcification of the aortic valve. There is mild thickening of the aortic valve. Aortic valve regurgitation is moderate. Mild aortic valve stenosis.  7. 3D performed of the LAA and demonstrates Extensive 3 D image done to measure device compression.  8. Only left to right shunt post trans septal puncture. FINDINGS  Left Ventricle: Left ventricular ejection fraction, by estimation, is 55 to 60%. The left ventricle has normal function. Strain was performed and the global longitudinal strain is indeterminate. The left ventricular internal cavity size was normal in size. Right Ventricle: The right ventricular size is normal. Right vetricular wall thickness was not assessed. Right ventricular systolic function is normal. Left Atrium: Left atrial size was mildly dilated. No left atrial/left atrial appendage thrombus was detected. Right Atrium: Right atrial size was normal in size. Pericardium: Trivial pericardial effusion is present. The pericardial effusion is anterior to the right ventricle. Mitral Valve: The mitral valve is abnormal. There is mild thickening of the mitral valve leaflet(s). Mild mitral valve regurgitation. Tricuspid Valve: The tricuspid valve is normal in structure. Tricuspid valve regurgitation is mild. Aortic Valve: The aortic valve is tricuspid. There is mild calcification of the aortic valve. There is mild thickening of the aortic valve. Aortic valve regurgitation is moderate. Mild aortic stenosis is present. Aortic valve mean gradient measures 6.0 mmHg. Aortic valve peak gradient measures 10.5 mmHg. Aortic valve area, by VTI measures 1.61 cm. Pulmonic Valve: The pulmonic valve was normal in structure. Pulmonic valve regurgitation is trivial. Aorta: The aortic root is normal in size and structure. IAS/Shunts: Only left to  right shunt post trans septal puncture. Additional Comments: Large chicken wing appendage No thrombus. 40 mm FLX device somewhat canted position with small initial butter along coumadin ridge. Negative tugg test Color flow with no leak past fabric after device warmed and released. Average compression 26%. 3D was performed not requiring image post processing  on an independent workstation and was normal. Spectral Doppler performed. LEFT VENTRICLE PLAX 2D LVOT diam:     1.80 cm LV SV:         62 LV SV Index:   36 LVOT Area:     2.54 cm  AORTIC VALVE AV Area (Vmax):    1.59 cm AV Area (Vmean):   1.50 cm AV Area (VTI):     1.61 cm AV Vmax:           162.00 cm/s AV Vmean:          121.000 cm/s AV VTI:            0.388 m AV Peak Grad:      10.5 mmHg AV Mean Grad:      6.0 mmHg LVOT Vmax:         101.00 cm/s LVOT Vmean:        71.100 cm/s LVOT VTI:          0.245 m LVOT/AV VTI ratio: 0.63  SHUNTS Systemic VTI:  0.24 m Systemic Diam: 1.80 cm Janelle Mediate MD Electronically signed by Janelle Mediate MD Signature Date/Time: 02/28/2024/11:47:53 AM    Final    CUP PACEART REMOTE DEVICE CHECK Result Date: 02/12/2024 ILR summary report received. Battery status OK. Normal device function. No new symptom, tachy, brady, or pause episodes. No new AF episodes. Monthly summary reports and ROV/PRN LA, CVRS     Subjective: No significant events overnight, reports loose light brown BMs overnight, no melena or bright blood, eager to go home today  Discharge Exam: Vitals:   03/08/24 0827 03/08/24 1157  BP: (!) 172/62 137/60  Pulse:    Resp: (!) 24 20  Temp: 98.6 F (37 C)   SpO2:  95%   Vitals:   03/08/24 0538 03/08/24 0700 03/08/24 0827 03/08/24 1157  BP: (!) 154/64  (!) 172/62 137/60  Pulse: 61     Resp: (!) 25 19 (!) 24 20  Temp: 98.1 F (36.7 C)  98.6 F (37 C)   TempSrc: Oral  Oral   SpO2: 92%   95%  Weight:      Height:        General: Pt is alert, awake, not in acute distress Cardiovascular: RRR,  S1/S2 +, no rubs, no gallops Respiratory: CTA bilaterally, no wheezing, no rhonchi Abdominal: Soft, NT, ND, bowel sounds + Extremities: no edema, no cyanosis    The results of significant diagnostics from this hospitalization (including imaging, microbiology, ancillary and laboratory) are listed below for reference.     Microbiology: No results found for this or any previous visit (from the past 240 hours).   Labs: BNP (last 3 results) No results for input(s): "BNP" in the last 8760 hours. Basic Metabolic Panel: Recent Labs  Lab 03/03/24 0800 03/05/24 1807 03/06/24 0439 03/07/24 0542 03/08/24 0204  NA 137 135 137 140 139  K 3.5 3.2* 3.7 3.5 3.4*  CL 104 103 107 110 110  CO2 20 20* 21* 20* 20*  GLUCOSE 148* 121* 100* 87 93  BUN 10 7* 7* 5* 7*  CREATININE 0.81 0.71 0.77 0.75 0.83  CALCIUM 8.9 9.0 8.3* 8.3* 8.5*   Liver Function Tests: Recent Labs  Lab 03/05/24 1807 03/06/24 0439  AST 25 21  ALT 16 14  ALKPHOS 55 46  BILITOT 0.4 0.5  PROT 6.5 5.6*  ALBUMIN 3.3* 2.8*   No results for input(s): "LIPASE", "AMYLASE" in the last 168 hours. No results for input(s): "  AMMONIA" in the last 168 hours. CBC: Recent Labs  Lab 03/03/24 0800 03/05/24 1807 03/05/24 2235 03/06/24 1245 03/06/24 2309 03/07/24 0542 03/07/24 2214 03/08/24 0204  WBC 9.1 9.3   < > 8.5 8.1 6.9 8.7 8.0  NEUTROABS 6.8 5.9  --   --   --   --   --   --   HGB 8.5* 8.2*   < > 9.4* 9.1* 9.4* 9.2* 9.2*  HCT 26.3* 25.7*   < > 29.1* 27.7* 28.9* 28.6* 29.3*  MCV 108* 109.4*   < > 101.0* 101.1* 101.0* 102.1* 102.8*  PLT 180 231   < > 220 194 193 225 220   < > = values in this interval not displayed.   Cardiac Enzymes: No results for input(s): "CKTOTAL", "CKMB", "CKMBINDEX", "TROPONINI" in the last 168 hours. BNP: Invalid input(s): "POCBNP" CBG: Recent Labs  Lab 03/06/24 0059 03/06/24 0623 03/06/24 2325 03/07/24 0521  GLUCAP 100* 106* 103* 95   D-Dimer No results for input(s): "DDIMER" in the  last 72 hours. Hgb A1c No results for input(s): "HGBA1C" in the last 72 hours. Lipid Profile No results for input(s): "CHOL", "HDL", "LDLCALC", "TRIG", "CHOLHDL", "LDLDIRECT" in the last 72 hours. Thyroid  function studies No results for input(s): "TSH", "T4TOTAL", "T3FREE", "THYROIDAB" in the last 72 hours.  Invalid input(s): "FREET3" Anemia work up Recent Labs    03/06/24 0100  VITAMINB12 624  FOLATE 7.6  FERRITIN 63  TIBC 420  IRON 31  RETICCTPCT 6.1*   Urinalysis No results found for: "COLORURINE", "APPEARANCEUR", "LABSPEC", "PHURINE", "GLUCOSEU", "HGBUR", "BILIRUBINUR", "KETONESUR", "PROTEINUR", "UROBILINOGEN", "NITRITE", "LEUKOCYTESUR" Sepsis Labs Recent Labs  Lab 03/06/24 2309 03/07/24 0542 03/07/24 2214 03/08/24 0204  WBC 8.1 6.9 8.7 8.0   Microbiology No results found for this or any previous visit (from the past 240 hours).   Time coordinating discharge: Over 30 minutes  SIGNED:   Seena Dadds, MD  [facility] 03/08/2024, 1:45 PM Pager   If 7PM-7AM, please contact night-coverage [email or web address] Password [email or web address]

## 2024-03-08 NOTE — Progress Notes (Addendum)
 PHARMACY - ANTICOAGULATION CONSULT NOTE  Pharmacy Consult for Heparin  while Eliquis  on hold Indication: atrial fibrillation  Allergies  Allergen Reactions   Cymbalta [Duloxetine Hcl] Other (See Comments)    Dizzy    Patient Measurements: Height: 5\' 1"  (154.9 cm) Weight: 71.9 kg (158 lb 8.2 oz) IBW/kg (Calculated) : 47.8 HEPARIN  DW (KG): 63.4  Vital Signs: Temp: 98.6 F (37 C) (05/03 0827) Temp Source: Oral (05/03 0827) BP: 172/62 (05/03 0827) Pulse Rate: 61 (05/03 0538)  Labs: Recent Labs    03/05/24 1807 03/05/24 2235 03/06/24 0439 03/06/24 1245 03/07/24 0542 03/07/24 2214 03/08/24 0204 03/08/24 1100  HGB 8.2*   < > 7.3*   < > 9.4* 9.2* 9.2*  --   HCT 25.7*   < > 22.8*   < > 28.9* 28.6* 29.3*  --   PLT 231   < > 197   < > 193 225 220  --   APTT  --   --   --   --  63*  --  66*  --   LABPROT 16.1*  --   --   --   --   --   --   --   INR 1.3*  --   --   --   --   --   --   --   HEPARINUNFRC  --   --   --   --  0.49  --  0.24* 0.34  CREATININE 0.71  --  0.77  --  0.75  --  0.83  --    < > = values in this interval not displayed.    Estimated Creatinine Clearance: 56.3 mL/min (by C-G formula based on SCr of 0.83 mg/dL).   Medical History: Past Medical History:  Diagnosis Date   Anxiety and depression    s/p suicide attempt 11/11   Aortic insufficiency    mild by echo 10/17   Atrial fibrillation (HCC)    Carotid stenosis, bilateral    0-39% by US    Colitis    Hepatitis C    Followed by Dr. Margarete Sharps at [facility].  s/p treatment with Bavoni.   History of shingles    HTN (hypertension)    Hypercholesteremia    Hypersomnia    OSA (obstructive sleep apnea)    Right shoulder pain    Seasonal allergies    Syncope and collapse    Tobacco use    Vitamin D deficiency     Medications:  Scheduled:   metoprolol  succinate  50 mg Oral Daily   pantoprazole   40 mg Oral BID AC   potassium chloride   30 mEq Oral Q6H   sertraline   150 mg Oral Daily    simvastatin   20 mg Oral QHS   traZODone   100 mg Oral QHS   Infusions:   cefTRIAXone  (ROCEPHIN )  IV 2 g (03/07/24 1115)   heparin  1,100 Units/hr (03/08/24 0529)   metronidazole  500 mg (03/08/24 1006)    Assessment: 73 yo female with afib s/p Watchman device placement 02/28/24 who developed black colored diarrhea and found to have Hgb drop 14.8 > 8.5. After EGD, pharmacy consulted to dose IV heparin  while Eliquis  on hold.  Heparin  level is therapeutic (0.34) on 1100 units/hr. No issues with infusion or s/sx of bleeding per RN. CBC stable.  Goal of Therapy:  Heparin  level 0.3-0.5 units/ml Monitor platelets by anticoagulation protocol: Yes   Plan:  Continue heparin  1100 units/hr F/u 8h confirmatory heparin  level Continue to  monitor H&H and platelets F/u plans to resume Eliquis   Abelina Abide, PharmD PGY1 Pharmacy Resident 03/08/2024 11:37 AM  ADDENDUM: Ortencia Blamer to switch patient from heparin  gtt to Eliquis  in anticipation of discharge. RN aware to stop heparin  gtt at time of first Eliquis  dose. If patient discharges today, RN also aware to have patient take evening dose no sooner than 2200 tonight.  Abelina Abide, PharmD PGY1 Pharmacy Resident 03/08/2024 1:31 PM

## 2024-03-08 NOTE — Progress Notes (Signed)
   Patient Name: Theresa Reynolds Date of Encounter: 03/08/2024 [facility] Cardiologist: None   Interval Summary  .    EGD yesterday, no active bleeding or intervention. No acute overnight events. Reports feeling relatively well. Not much appetite otherwise no new or acute complaints.   Vital Signs .    Vitals:   03/08/24 0538 03/08/24 0700 03/08/24 0827 03/08/24 1157  BP: (!) 154/64  (!) 172/62 137/60  Pulse: 61     Resp: (!) 25 19 (!) 24 20  Temp: 98.1 F (36.7 C)  98.6 F (37 C)   TempSrc: Oral  Oral   SpO2: 92%   95%  Weight:      Height:        Intake/Output Summary (Last 24 hours) at 03/08/2024 1308 Last data filed at 03/07/2024 2244 Gross per 24 hour  Intake 340 ml  Output --  Net 340 ml      03/06/2024    3:43 AM 03/05/2024    9:46 PM 03/05/2024    6:00 PM  Last 3 Weights  Weight (lbs) 158 lb 8.2 oz 156 lb 8.4 oz 156 lb 8.4 oz  Weight (kg) 71.9 kg 71 kg 71 kg      Telemetry/ECG    SR - Personally Reviewed  Physical Exam .   GEN: No acute distress.   Neck: No JVD Cardiac: Normal rate, regular Respiratory: Clear to auscultation bilaterally. GI: Soft, nontender, non-distended  MS: No edema  Assessment & Plan .     #Acute upper GI bleed #Melena -Appreciate GI team's assistance.  Hemoglobin has been stable on IV heparin .    #Atrial fibrillation #Watchman device in situ - Transition to Eliquis  on discharge.  - Continue home metoprolol .  - She has EP follow up already scheduled.   For questions or updates, please contact [facility] Please consult [email or web address] for contact info under        Signed, Ardeen Kohler, MD

## 2024-03-10 ENCOUNTER — Encounter (HOSPITAL_COMMUNITY): Payer: Self-pay | Admitting: Internal Medicine

## 2024-03-10 ENCOUNTER — Telehealth: Payer: Self-pay

## 2024-03-10 DIAGNOSIS — I480 Paroxysmal atrial fibrillation: Secondary | ICD-10-CM

## 2024-03-10 DIAGNOSIS — Z95818 Presence of other cardiac implants and grafts: Secondary | ICD-10-CM

## 2024-03-10 NOTE — Addendum Note (Signed)
 Addended by: Orrin Yurkovich A on: 03/10/2024 05:11 PM   Modules accepted: Orders

## 2024-03-10 NOTE — Anesthesia Postprocedure Evaluation (Signed)
 Anesthesia Post Note  Patient: Theresa Reynolds  Procedure(s) Performed: EGD (ESOPHAGOGASTRODUODENOSCOPY)     Patient location during evaluation: Endoscopy Anesthesia Type: MAC Level of consciousness: awake Pain management: pain level controlled Vital Signs Assessment: post-procedure vital signs reviewed and stable Respiratory status: spontaneous breathing, nonlabored ventilation and respiratory function stable Cardiovascular status: blood pressure returned to baseline and stable Postop Assessment: no apparent nausea or vomiting Anesthetic complications: no   No notable events documented.  Last Vitals:  Vitals:   03/08/24 0827 03/08/24 1157  BP: (!) 172/62 137/60  Pulse:    Resp: (!) 24 20  Temp: 37 C   SpO2:  95%    Last Pain:  Vitals:   03/08/24 0919  TempSrc:   PainSc: 5                  Elyse Prevo P Lucresha Dismuke

## 2024-03-10 NOTE — Telephone Encounter (Signed)
  [facility]   Watchman Team  Contacted the patient regarding discharge from [facility] on 02/28/2023. The patient was then readmitted 4/30 and discharged 03/08/2024.  The patient understands to follow up with Michaelle Adolphus on 04/14/2024  The patient understands discharge instructions? Yes  The patient understands medications and regimen? Yes   The patient understands to call with any questions or concerns prior to scheduled visit.   The patient is scheduled to see her PCP tomorrow for evaluation and repeat blood work. Will request records and follow-up with the patient later this week.

## 2024-03-11 DIAGNOSIS — F5101 Primary insomnia: Secondary | ICD-10-CM | POA: Diagnosis not present

## 2024-03-11 DIAGNOSIS — D649 Anemia, unspecified: Secondary | ICD-10-CM | POA: Diagnosis not present

## 2024-03-11 DIAGNOSIS — I4891 Unspecified atrial fibrillation: Secondary | ICD-10-CM | POA: Diagnosis not present

## 2024-03-11 DIAGNOSIS — Z09 Encounter for follow-up examination after completed treatment for conditions other than malignant neoplasm: Secondary | ICD-10-CM | POA: Diagnosis not present

## 2024-03-11 DIAGNOSIS — Z6830 Body mass index (BMI) 30.0-30.9, adult: Secondary | ICD-10-CM | POA: Diagnosis not present

## 2024-03-11 DIAGNOSIS — I480 Paroxysmal atrial fibrillation: Secondary | ICD-10-CM | POA: Diagnosis not present

## 2024-03-11 DIAGNOSIS — E559 Vitamin D deficiency, unspecified: Secondary | ICD-10-CM | POA: Diagnosis not present

## 2024-03-11 DIAGNOSIS — F3341 Major depressive disorder, recurrent, in partial remission: Secondary | ICD-10-CM | POA: Diagnosis not present

## 2024-03-11 DIAGNOSIS — E782 Mixed hyperlipidemia: Secondary | ICD-10-CM | POA: Diagnosis not present

## 2024-03-17 ENCOUNTER — Ambulatory Visit (INDEPENDENT_AMBULATORY_CARE_PROVIDER_SITE_OTHER): Payer: Medicare HMO

## 2024-03-17 DIAGNOSIS — I480 Paroxysmal atrial fibrillation: Secondary | ICD-10-CM | POA: Diagnosis not present

## 2024-03-17 LAB — CUP PACEART REMOTE DEVICE CHECK
Date Time Interrogation Session: 20250511233307
Implantable Pulse Generator Implant Date: 20220613

## 2024-03-21 ENCOUNTER — Ambulatory Visit: Admitting: Student

## 2024-03-23 ENCOUNTER — Ambulatory Visit: Payer: Self-pay | Admitting: Cardiology

## 2024-03-26 NOTE — Progress Notes (Signed)
 Carelink Summary Report / Loop Recorder

## 2024-03-26 NOTE — Addendum Note (Signed)
 Addended by: Edra Govern D on: 03/26/2024 10:08 AM   Modules accepted: Orders

## 2024-03-28 DIAGNOSIS — G4733 Obstructive sleep apnea (adult) (pediatric): Secondary | ICD-10-CM | POA: Diagnosis not present

## 2024-04-14 ENCOUNTER — Ambulatory Visit: Admitting: Student

## 2024-04-16 ENCOUNTER — Other Ambulatory Visit: Payer: Self-pay | Admitting: Cardiology

## 2024-04-16 DIAGNOSIS — I480 Paroxysmal atrial fibrillation: Secondary | ICD-10-CM

## 2024-04-16 NOTE — Telephone Encounter (Signed)
 Prescription refill request for Eliquis  received. Indication: PAF Last office visit: 02/01/24  Juel Nutley PA-C Scr: 0.83 on 03/08/24  Epic Age: 73 Weight: 71.8kg  Based on above findings Eliquis  5mg  twice daily is the appropriate dose.  Refill approved.

## 2024-04-16 NOTE — Progress Notes (Signed)
  Electrophysiology Office Note:   Date:  04/16/2024  ID:  Theresa Reynolds, DOB Jul 07, 1951, MRN [id number]  Primary Cardiologist: None Electrophysiologist: Boyce Byes, MD      History of Present Illness:   Theresa Reynolds is a 73 y.o. female with h/o PAF s/p ablation, carotid stenosis, HTN, HLD, hep C, OSA on CPAP, tobacco abuse, and HTN seen today for routine electrophysiology follow-up s/p Watchman.  Pt admitted for Acute GI bleeding 4/30 - 5/3. EGD with mucosal tear on distal esophagus. Was bridged with heparin  and then able to resume Eliquis .  Since last being seen in our clinic the patient reports doing OK. No further bleeding. 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 No undue SOB as long as she takes her time. Denies chest pain, dizziness, or edema.   Review of systems complete and found to be negative unless listed in HPI.   EP Information / Studies Reviewed:    EKG is not ordered today. EKG from 02/01/2024 reviewed which showed NSR at 60 bpm       Arrhythmia/Device History S/p AF ablation 05/2017 Medtronic loop implanted 05/2017 for AF  S/p Watchman 02/28/24   Physical Exam:   VS:  There were no vitals taken for this visit.   Wt Readings from Last 3 Encounters:  03/06/24 158 lb 8.2 oz (71.9 kg)  02/28/24 158 lb (71.7 kg)  02/01/24 158 lb 3.2 oz (71.8 kg)     GEN: No acute distress NECK: No JVD; No carotid bruits CARDIAC: Regular rate and rhythm, no murmurs, rubs, gallops RESPIRATORY:  Clear to auscultation without rales, wheezing or rhonchi  ABDOMEN: Soft, non-tender, non-distended EXTREMITIES:  No edema; No deformity   ASSESSMENT AND PLAN:    AF s/p Medtronic Loop Recorder Secondary hypercoagulable state S/p Ablation and now s/p Watchman STOP eliquis  with an evening dose START plavix 75 mg daily the next morning to complete 6 month total course.  Post op CT scan scheduled for 04/25/24, letter given today.   H/o GI bleeding Underwent EGD 5/2 with  mucosal tear of distal esophagus and was able to resume Eliquis .   Follow up with EP Team in October for post 6 month Watchman follow up.   Signed, Tylene Galla, PA-C

## 2024-04-17 ENCOUNTER — Ambulatory Visit (INDEPENDENT_AMBULATORY_CARE_PROVIDER_SITE_OTHER)

## 2024-04-17 ENCOUNTER — Ambulatory Visit: Attending: Student | Admitting: Student

## 2024-04-17 ENCOUNTER — Encounter: Payer: Self-pay | Admitting: Student

## 2024-04-17 VITALS — BP 103/59 | HR 65 | Ht 61.0 in | Wt 153.6 lb

## 2024-04-17 DIAGNOSIS — K922 Gastrointestinal hemorrhage, unspecified: Secondary | ICD-10-CM

## 2024-04-17 DIAGNOSIS — R55 Syncope and collapse: Secondary | ICD-10-CM | POA: Diagnosis not present

## 2024-04-17 DIAGNOSIS — I480 Paroxysmal atrial fibrillation: Secondary | ICD-10-CM | POA: Diagnosis not present

## 2024-04-17 DIAGNOSIS — Z95818 Presence of other cardiac implants and grafts: Secondary | ICD-10-CM

## 2024-04-17 LAB — BASIC METABOLIC PANEL WITH GFR
BUN/Creatinine Ratio: 14 (ref 12–28)
BUN: 10 mg/dL (ref 8–27)
CO2: 18 mmol/L — ABNORMAL LOW (ref 20–29)
Calcium: 10 mg/dL (ref 8.7–10.3)
Chloride: 100 mmol/L (ref 96–106)
Creatinine, Ser: 0.74 mg/dL (ref 0.57–1.00)
Glucose: 94 mg/dL (ref 70–99)
Potassium: 4.7 mmol/L (ref 3.5–5.2)
Sodium: 136 mmol/L (ref 134–144)
eGFR: 86 mL/min/{1.73_m2} (ref >59)

## 2024-04-17 MED ORDER — AMOXICILLIN 500 MG PO TABS
2000.0000 mg | ORAL_TABLET | ORAL | 1 refills | Status: DC | PRN
Start: 2024-04-17 — End: 2024-07-14

## 2024-04-17 MED ORDER — CLOPIDOGREL BISULFATE 75 MG PO TABS: 75.0000 mg | ORAL_TABLET | Freq: Every day | ORAL | 5 refills | Status: DC

## 2024-04-17 NOTE — Patient Instructions (Addendum)
 Medication Instructions:  STOP Eliquis  with an evening dose START plavix 75 mg daily the next morning.    Amoxicillin  to be taken 1 hr prior to any dental procedures, including cleaning.   *If you need a refill on your cardiac medications before your next appointment, please call your pharmacy*  Lab Work: BMET today If you have labs (blood work) drawn today and your tests are completely normal, you will receive your results only by: MyChart Message (if you have MyChart) OR A paper copy in the mail If you have any lab test that is abnormal or we need to change your treatment, we will call you to review the results.  Testing/Procedures: CT scan as ordered  Follow-Up: At [facility], you and your health needs are our priority.  As part of our continuing mission to provide you with exceptional heart care, our providers are all part of one team.  This team includes your primary Cardiologist (physician) and Advanced Practice Providers or APPs (Physician Assistants and Nurse Practitioners) who all work together to provide you with the care you need, when you need it.  Your next appointment:   5 month(s)  Provider:   You may see Boyce Byes, MD or one of the following Advanced Practice Providers on your designated Care Team:   Mertha Abrahams, PA-C Dexton Zwilling Andy Laron Angelini, PA-C Suzann Riddle, NP Creighton Doffing, NP    We recommend signing up for the patient portal called MyChart.  Sign up information is provided on this After Visit Summary.  MyChart is used to connect with patients for Virtual Visits (Telemedicine).  Patients are able to view lab/test results, encounter notes, upcoming appointments, etc.  Non-urgent messages can be sent to your provider as well.   To learn more about what you can do with MyChart, go to [email or web address].

## 2024-04-18 ENCOUNTER — Ambulatory Visit: Payer: Self-pay | Admitting: Student

## 2024-04-19 ENCOUNTER — Ambulatory Visit: Payer: Self-pay | Admitting: Cardiology

## 2024-04-24 DIAGNOSIS — H524 Presbyopia: Secondary | ICD-10-CM | POA: Diagnosis not present

## 2024-04-25 ENCOUNTER — Ambulatory Visit (HOSPITAL_COMMUNITY)
Admission: RE | Admit: 2024-04-25 | Discharge: 2024-04-25 | Disposition: A | Discharge status: Home / Self Care | Source: Ambulatory Visit | Attending: Cardiology | Admitting: Cardiology

## 2024-04-25 DIAGNOSIS — I4891 Unspecified atrial fibrillation: Secondary | ICD-10-CM | POA: Diagnosis not present

## 2024-04-25 DIAGNOSIS — I480 Paroxysmal atrial fibrillation: Secondary | ICD-10-CM | POA: Diagnosis not present

## 2024-04-25 DIAGNOSIS — T82538A Leakage of other cardiac and vascular devices and implants, initial encounter: Secondary | ICD-10-CM | POA: Diagnosis not present

## 2024-04-25 DIAGNOSIS — Z95818 Presence of other cardiac implants and grafts: Secondary | ICD-10-CM | POA: Insufficient documentation

## 2024-04-25 DIAGNOSIS — I272 Pulmonary hypertension, unspecified: Secondary | ICD-10-CM | POA: Diagnosis not present

## 2024-04-25 DIAGNOSIS — G4733 Obstructive sleep apnea (adult) (pediatric): Secondary | ICD-10-CM | POA: Diagnosis not present

## 2024-04-25 DIAGNOSIS — Z6828 Body mass index (BMI) 28.0-28.9, adult: Secondary | ICD-10-CM | POA: Diagnosis not present

## 2024-04-25 DIAGNOSIS — I739 Peripheral vascular disease, unspecified: Secondary | ICD-10-CM | POA: Diagnosis not present

## 2024-04-25 MED ORDER — IOHEXOL 350 MG/ML SOLN
100.0000 mL | Freq: Once | INTRAVENOUS | Status: AC | PRN
Start: 2024-04-25 — End: 2024-04-25
  Administered 2024-04-25: 100 mL via INTRAVENOUS

## 2024-04-28 ENCOUNTER — Ambulatory Visit: Payer: Self-pay | Admitting: Cardiology

## 2024-05-02 ENCOUNTER — Ambulatory Visit: Admitting: Student

## 2024-05-05 NOTE — Progress Notes (Signed)
 Carelink Summary Report / Loop Recorder

## 2024-05-16 DIAGNOSIS — Z6828 Body mass index (BMI) 28.0-28.9, adult: Secondary | ICD-10-CM | POA: Diagnosis not present

## 2024-05-16 DIAGNOSIS — F5101 Primary insomnia: Secondary | ICD-10-CM | POA: Diagnosis not present

## 2024-05-16 DIAGNOSIS — F3341 Major depressive disorder, recurrent, in partial remission: Secondary | ICD-10-CM | POA: Diagnosis not present

## 2024-05-19 ENCOUNTER — Ambulatory Visit: Payer: Self-pay | Admitting: Cardiology

## 2024-05-19 ENCOUNTER — Ambulatory Visit

## 2024-05-19 DIAGNOSIS — R55 Syncope and collapse: Secondary | ICD-10-CM | POA: Diagnosis not present

## 2024-05-19 LAB — CUP PACEART REMOTE DEVICE CHECK
Date Time Interrogation Session: 20250713230611
Implantable Pulse Generator Implant Date: 20220613

## 2024-06-05 DIAGNOSIS — E782 Mixed hyperlipidemia: Secondary | ICD-10-CM | POA: Diagnosis not present

## 2024-06-05 DIAGNOSIS — I10 Essential (primary) hypertension: Secondary | ICD-10-CM | POA: Diagnosis not present

## 2024-06-05 DIAGNOSIS — F3341 Major depressive disorder, recurrent, in partial remission: Secondary | ICD-10-CM | POA: Diagnosis not present

## 2024-06-05 DIAGNOSIS — I480 Paroxysmal atrial fibrillation: Secondary | ICD-10-CM | POA: Diagnosis not present

## 2024-06-09 NOTE — Progress Notes (Signed)
 Carelink Summary Report / Loop Recorder

## 2024-06-09 NOTE — Addendum Note (Signed)
 Addended by: VICCI SELLER A on: 06/09/2024 01:56 PM   Modules accepted: Orders

## 2024-06-19 ENCOUNTER — Ambulatory Visit (INDEPENDENT_AMBULATORY_CARE_PROVIDER_SITE_OTHER)

## 2024-06-19 DIAGNOSIS — R55 Syncope and collapse: Secondary | ICD-10-CM

## 2024-06-19 LAB — CUP PACEART REMOTE DEVICE CHECK
Date Time Interrogation Session: 20250813230459
Implantable Pulse Generator Implant Date: 20220613

## 2024-06-20 ENCOUNTER — Ambulatory Visit: Payer: Self-pay | Admitting: Cardiology

## 2024-06-30 DIAGNOSIS — G4733 Obstructive sleep apnea (adult) (pediatric): Secondary | ICD-10-CM | POA: Diagnosis not present

## 2024-07-06 DIAGNOSIS — I480 Paroxysmal atrial fibrillation: Secondary | ICD-10-CM | POA: Diagnosis not present

## 2024-07-06 DIAGNOSIS — E782 Mixed hyperlipidemia: Secondary | ICD-10-CM | POA: Diagnosis not present

## 2024-07-06 DIAGNOSIS — F3341 Major depressive disorder, recurrent, in partial remission: Secondary | ICD-10-CM | POA: Diagnosis not present

## 2024-07-06 DIAGNOSIS — I10 Essential (primary) hypertension: Secondary | ICD-10-CM | POA: Diagnosis not present

## 2024-07-14 ENCOUNTER — Other Ambulatory Visit: Payer: Self-pay | Admitting: Student

## 2024-07-14 DIAGNOSIS — Z95818 Presence of other cardiac implants and grafts: Secondary | ICD-10-CM

## 2024-07-14 DIAGNOSIS — K922 Gastrointestinal hemorrhage, unspecified: Secondary | ICD-10-CM

## 2024-07-14 DIAGNOSIS — I480 Paroxysmal atrial fibrillation: Secondary | ICD-10-CM

## 2024-07-21 ENCOUNTER — Ambulatory Visit (INDEPENDENT_AMBULATORY_CARE_PROVIDER_SITE_OTHER)

## 2024-07-21 DIAGNOSIS — R55 Syncope and collapse: Secondary | ICD-10-CM

## 2024-07-22 LAB — CUP PACEART REMOTE DEVICE CHECK
Date Time Interrogation Session: 20250914230550
Implantable Pulse Generator Implant Date: 20220613

## 2024-07-24 ENCOUNTER — Ambulatory Visit: Payer: Self-pay | Admitting: Cardiology

## 2024-07-28 NOTE — Progress Notes (Signed)
 Remote Loop Recorder Transmission

## 2024-07-31 NOTE — Progress Notes (Signed)
 Remote Loop Recorder Transmission

## 2024-08-05 DIAGNOSIS — I10 Essential (primary) hypertension: Secondary | ICD-10-CM | POA: Diagnosis not present

## 2024-08-05 DIAGNOSIS — I480 Paroxysmal atrial fibrillation: Secondary | ICD-10-CM | POA: Diagnosis not present

## 2024-08-05 DIAGNOSIS — F3341 Major depressive disorder, recurrent, in partial remission: Secondary | ICD-10-CM | POA: Diagnosis not present

## 2024-08-05 DIAGNOSIS — E782 Mixed hyperlipidemia: Secondary | ICD-10-CM | POA: Diagnosis not present

## 2024-08-14 DIAGNOSIS — M1712 Unilateral primary osteoarthritis, left knee: Secondary | ICD-10-CM | POA: Diagnosis not present

## 2024-08-14 DIAGNOSIS — M5432 Sciatica, left side: Secondary | ICD-10-CM | POA: Diagnosis not present

## 2024-08-21 ENCOUNTER — Encounter

## 2024-08-21 ENCOUNTER — Ambulatory Visit (INDEPENDENT_AMBULATORY_CARE_PROVIDER_SITE_OTHER)

## 2024-08-21 DIAGNOSIS — R55 Syncope and collapse: Secondary | ICD-10-CM | POA: Diagnosis not present

## 2024-08-21 LAB — CUP PACEART REMOTE DEVICE CHECK
Date Time Interrogation Session: 20251015230130
Implantable Pulse Generator Implant Date: 20220613

## 2024-08-25 ENCOUNTER — Ambulatory Visit: Payer: Self-pay | Admitting: Cardiology

## 2024-08-26 NOTE — Progress Notes (Unsigned)
  Electrophysiology Office Follow up Visit Note:    Date:  08/26/2024   ID:  LAURELAI LEPP, DOB 20-Mar-1951, MRN [id number]  PCP:  Marvene Prentice SAUNDERS, FNP  [facility] Cardiologist:  None  [facility] Electrophysiologist:  [name], MD    Interval History:     Theresa Reynolds is a 73 y.o. female who presents for a follow up visit.   The patient last saw Jodie April 17, 2024.  She has a history of atrial fibrillation and watchman.  At that appointment with Jodie she was transition to Plavix .  She has had GI bleeding in the past.        Past medical, surgical, social and family history were reviewed.  ROS:   Please see the history of present illness.    All other systems reviewed and are negative.  EKGs/Labs/Other Studies Reviewed:    The following studies were reviewed today:  April 30, 2023 CT cardiac reviewed 40 mm Watchman in situ.  Small gutter on superior portion of device.        Physical Exam:    VS:  There were no vitals taken for this visit.    Wt Readings from Last 3 Encounters:  04/17/24 153 lb 9.6 oz (69.7 kg)  03/06/24 158 lb 8.2 oz (71.9 kg)  02/28/24 158 lb (71.7 kg)     GEN: no distress CARD: RRR, No MRG RESP: No IWOB. CTAB.      ASSESSMENT:    No diagnosis found. PLAN:    In order of problems listed above:  #Atrial fibrillation #Watchman in situ Watchman implanted April 29, 2024.  Follow-up imaging demonstrated a small gutter on the superior aspect of the device.  Plan for repeat CT scan in 3 months to reassess appendage for closure. Continue Plavix   Continue metoprolol   I discussed my upcoming departure from [facility] during today's clinic appointment.  We will transition her care to Dr. Almetta moving forward.  Follow-up with EP APP in 6 months.  CT scan in 3 months.  Signed, OLE HOLTS, MD, [facility], [facility] 08/26/2024 1:00 PM    Electrophysiology [facility]

## 2024-08-27 ENCOUNTER — Ambulatory Visit: Attending: Cardiology | Admitting: Cardiology

## 2024-08-27 ENCOUNTER — Encounter: Payer: Self-pay | Admitting: Cardiology

## 2024-08-27 VITALS — BP 110/62 | HR 68 | Ht 61.0 in | Wt 149.0 lb

## 2024-08-27 DIAGNOSIS — I480 Paroxysmal atrial fibrillation: Secondary | ICD-10-CM | POA: Diagnosis not present

## 2024-08-27 DIAGNOSIS — Z95818 Presence of other cardiac implants and grafts: Secondary | ICD-10-CM

## 2024-08-27 NOTE — Patient Instructions (Signed)
 Medication Instructions:  Your physician recommends that you continue on your current medications as directed. Please refer to the Current Medication list given to you today.  *If you need a refill on your cardiac medications before your next appointment, please call your pharmacy*   Testing/Procedures: CT scan - 3 months  Follow-Up: At [facility], you and your health needs are our priority.  As part of our continuing mission to provide you with exceptional heart care, our providers are all part of one team.  This team includes your primary Cardiologist (physician) and Advanced Practice Providers or APPs (Physician Assistants and Nurse Practitioners) who all work together to provide you with the care you need, when you need it.  Your next appointment:   6 months  Provider:   Daphne Barrack, NP

## 2024-08-27 NOTE — Progress Notes (Signed)
 Remote Loop Recorder Transmission

## 2024-08-28 ENCOUNTER — Telehealth: Payer: Self-pay

## 2024-08-28 DIAGNOSIS — I480 Paroxysmal atrial fibrillation: Secondary | ICD-10-CM

## 2024-08-28 DIAGNOSIS — Z95818 Presence of other cardiac implants and grafts: Secondary | ICD-10-CM

## 2024-08-28 NOTE — Telephone Encounter (Signed)
 Patient returned call. Arranged repeat CT scan for Watchman evaluation 12/01/2024.   BMET ordered.   Instruction letter will be sent to patient.

## 2024-08-28 NOTE — Telephone Encounter (Signed)
 Left voicemail for patient to return call to arrange CT scan for end of January 2026.

## 2024-09-05 DIAGNOSIS — E782 Mixed hyperlipidemia: Secondary | ICD-10-CM | POA: Diagnosis not present

## 2024-09-05 DIAGNOSIS — F3341 Major depressive disorder, recurrent, in partial remission: Secondary | ICD-10-CM | POA: Diagnosis not present

## 2024-09-05 DIAGNOSIS — I10 Essential (primary) hypertension: Secondary | ICD-10-CM | POA: Diagnosis not present

## 2024-09-05 DIAGNOSIS — I480 Paroxysmal atrial fibrillation: Secondary | ICD-10-CM | POA: Diagnosis not present

## 2024-09-10 DIAGNOSIS — M1712 Unilateral primary osteoarthritis, left knee: Secondary | ICD-10-CM | POA: Diagnosis not present

## 2024-09-10 DIAGNOSIS — M545 Low back pain, unspecified: Secondary | ICD-10-CM | POA: Diagnosis not present

## 2024-09-10 DIAGNOSIS — M25551 Pain in right hip: Secondary | ICD-10-CM | POA: Diagnosis not present

## 2024-09-17 DIAGNOSIS — M1712 Unilateral primary osteoarthritis, left knee: Secondary | ICD-10-CM | POA: Diagnosis not present

## 2024-09-21 ENCOUNTER — Encounter

## 2024-09-22 ENCOUNTER — Encounter

## 2024-09-22 DIAGNOSIS — M1611 Unilateral primary osteoarthritis, right hip: Secondary | ICD-10-CM | POA: Diagnosis not present

## 2024-09-23 ENCOUNTER — Ambulatory Visit: Attending: Cardiology

## 2024-09-23 DIAGNOSIS — I480 Paroxysmal atrial fibrillation: Secondary | ICD-10-CM | POA: Diagnosis not present

## 2024-09-24 DIAGNOSIS — M1712 Unilateral primary osteoarthritis, left knee: Secondary | ICD-10-CM | POA: Diagnosis not present

## 2024-09-24 LAB — CUP PACEART REMOTE DEVICE CHECK
Date Time Interrogation Session: 20251117230358
Implantable Pulse Generator Implant Date: 20220613

## 2024-09-25 ENCOUNTER — Ambulatory Visit: Payer: Self-pay | Admitting: Cardiology

## 2024-09-26 NOTE — Progress Notes (Signed)
 Remote Loop Recorder Transmission

## 2024-10-01 DIAGNOSIS — G4733 Obstructive sleep apnea (adult) (pediatric): Secondary | ICD-10-CM | POA: Diagnosis not present

## 2024-10-05 ENCOUNTER — Other Ambulatory Visit: Payer: Self-pay | Admitting: Student

## 2024-10-05 DIAGNOSIS — Z95818 Presence of other cardiac implants and grafts: Secondary | ICD-10-CM

## 2024-10-05 DIAGNOSIS — E782 Mixed hyperlipidemia: Secondary | ICD-10-CM | POA: Diagnosis not present

## 2024-10-05 DIAGNOSIS — F3341 Major depressive disorder, recurrent, in partial remission: Secondary | ICD-10-CM | POA: Diagnosis not present

## 2024-10-05 DIAGNOSIS — I480 Paroxysmal atrial fibrillation: Secondary | ICD-10-CM | POA: Diagnosis not present

## 2024-10-05 DIAGNOSIS — I10 Essential (primary) hypertension: Secondary | ICD-10-CM | POA: Diagnosis not present

## 2024-10-10 DIAGNOSIS — M25551 Pain in right hip: Secondary | ICD-10-CM | POA: Diagnosis not present

## 2024-10-20 ENCOUNTER — Telehealth: Payer: Self-pay

## 2024-10-20 ENCOUNTER — Telehealth: Payer: Self-pay | Admitting: Cardiology

## 2024-10-20 DIAGNOSIS — M1611 Unilateral primary osteoarthritis, right hip: Secondary | ICD-10-CM | POA: Diagnosis not present

## 2024-10-20 DIAGNOSIS — M5432 Sciatica, left side: Secondary | ICD-10-CM | POA: Diagnosis not present

## 2024-10-20 NOTE — Telephone Encounter (Signed)
° °  Pre-operative Risk Assessment    Patient Name: Theresa Reynolds  DOB: 1950/12/25 MRN: [id number]   Date of last office visit: 08/27/24 DR. LAMBERT Date of next office visit: NONE   Request for Surgical Clearance    Procedure:  RIGHT TOTAL HIP ARTHROPLASTY  Date of Surgery:  Clearance TBD                                Surgeon:  DR. REDELL SHOALS Surgeon's Group or Practice Name:  [facility] Phone number:  [phone] JOEN SIC Fax number:  [phone]   Type of Clearance Requested:   - Medical  - Pharmacy:  Hold Clopidogrel  (Plavix )     Type of Anesthesia:  Spinal   Additional requests/questions:    Theresa Reynolds   10/20/2024, 4:41 PM

## 2024-10-20 NOTE — Telephone Encounter (Signed)
° °  Name: Theresa Reynolds  DOB: May 08, 1951  MRN: [id number]  Primary Cardiologist: Kennyth   Preoperative team, please contact this patient and set up a phone call appointment for further preoperative risk assessment. Please obtain consent and complete medication review. Thank you for your help.  I confirm that guidance regarding antiplatelet and oral anticoagulation therapy has been completed and, if necessary, noted below.  Cannot stop Plavix  until after October 17, 2024, as she was to complete a full 6 month course of antiplatelet per note on 04/17/2024  I also confirmed the patient resides in the state of [place] . As per [facility] Medical Board telemedicine laws, the patient must reside in the state in which the provider is licensed.   Lamarr Satterfield, NP 10/20/2024, 4:49 PM [facility]

## 2024-10-20 NOTE — Telephone Encounter (Signed)
 Patient dropped off a Pre-op clearance form for review and follow up.  Please look for faxed document.

## 2024-10-20 NOTE — Telephone Encounter (Signed)
 Patient is scheduled for 11/07/2024 @9am . Med req and consent ar complete. Call patient on [phone].

## 2024-10-20 NOTE — Telephone Encounter (Signed)
°  Patient Consent for Virtual Visit         Theresa Reynolds has provided verbal consent on 10/20/2024 for a virtual visit (video or telephone).  Patient is scheduled for 11/07/2024 @9am . Med req and consent ar complete. Call patient on [phone].    CONSENT FOR VIRTUAL VISIT FOR:  Theresa Reynolds  By participating in this virtual visit I agree to the following:  I hereby voluntarily request, consent and authorize [facility] and its employed or contracted physicians, physician assistants, nurse practitioners or other licensed health care professionals (the Practitioner), to provide me with telemedicine health care services (the Services) as deemed necessary by the treating Practitioner. I acknowledge and consent to receive the Services by the Practitioner via telemedicine. I understand that the telemedicine visit will involve communicating with the Practitioner through live audiovisual communication technology and the disclosure of certain medical information by electronic transmission. I acknowledge that I have been given the opportunity to request an in-person assessment or other available alternative prior to the telemedicine visit and am voluntarily participating in the telemedicine visit.  I understand that I have the right to withhold or withdraw my consent to the use of telemedicine in the course of my care at any time, without affecting my right to future care or treatment, and that the Practitioner or I may terminate the telemedicine visit at any time. I understand that I have the right to inspect all information obtained and/or recorded in the course of the telemedicine visit and may receive copies of available information for a reasonable fee.  I understand that some of the potential risks of receiving the Services via telemedicine include:  Delay or interruption in medical evaluation due to technological equipment failure or disruption; Information transmitted may not be  sufficient (e.g. poor resolution of images) to allow for appropriate medical decision making by the Practitioner; and/or  In rare instances, security protocols could fail, causing a breach of personal health information.  Furthermore, I acknowledge that it is my responsibility to provide information about my medical history, conditions and care that is complete and accurate to the best of my ability. I acknowledge that Practitioner's advice, recommendations, and/or decision may be based on factors not within their control, such as incomplete or inaccurate data provided by me or distortions of diagnostic images or specimens that may result from electronic transmissions. I understand that the practice of medicine is not an exact science and that Practitioner makes no warranties or guarantees regarding treatment outcomes. I acknowledge that a copy of this consent can be made available to me via my patient portal [facility] MyChart), or I can request a printed copy by calling the office of [facility].    I understand that my insurance will be billed for this visit.   I have read or had this consent read to me. I understand the contents of this consent, which adequately explains the benefits and risks of the Services being provided via telemedicine.  I have been provided ample opportunity to ask questions regarding this consent and the Services and have had my questions answered to my satisfaction. I give my informed consent for the services to be provided through the use of telemedicine in my medical care

## 2024-10-22 ENCOUNTER — Encounter

## 2024-10-23 ENCOUNTER — Encounter

## 2024-10-24 ENCOUNTER — Ambulatory Visit

## 2024-10-24 DIAGNOSIS — R55 Syncope and collapse: Secondary | ICD-10-CM | POA: Diagnosis not present

## 2024-10-24 LAB — CUP PACEART REMOTE DEVICE CHECK
Date Time Interrogation Session: 20251218230139
Implantable Pulse Generator Implant Date: 20220613

## 2024-10-27 NOTE — Progress Notes (Signed)
 Remote Loop Recorder Transmission

## 2024-11-07 ENCOUNTER — Ambulatory Visit: Attending: Cardiology

## 2024-11-07 DIAGNOSIS — Z01810 Encounter for preprocedural cardiovascular examination: Secondary | ICD-10-CM

## 2024-11-07 NOTE — Telephone Encounter (Signed)
 Pt has been scheduled in office 11/17/24 Orren Fabry, [facility] per preop APP Miriam Shams, NP. Per Miriam: Message Received: Today Campbell, Kenzie E, NP  P Cv Div Preop Callback Will need first available in office visit for cardiac clearance.  Message Received: Today Campbell, Kenzie E, NP  P Cv Div Preop Callback Will need first available in office visit for cardiac clearance.   Telephone Visit 11/07/2024 [facility] of [employer]. [facility]   Preoperative cardiovascular examination Dx Referred by Marvene Prentice SAUNDERS, FNP Reason for Visit       Orders Placed  None   Medication Changes   None Medication List   Visit Diagnoses   Preoperative cardiovascular examination Problem List  All Notes   Progress Notes by Campbell, Kenzie E, NP at 11/07/2024 9:00 AM                                  Patient Name: Theresa Reynolds  DOB: 05/12/1951 MRN: [id number]   Primary Cardiologist: None   Chart reviewed as part of pre-operative protocol coverage.    Patient called today for preoperative risk assessment. Based on our discussion, she did not meet functional capacity to safely proceed with surgery. Given her history of CAC score of 803, 95th percentile. Recommend in-office visit with general cardiology for further cardiac evaluation.    Kenzie E Campbell, NP 11/07/2024, 9:39 AM

## 2024-11-07 NOTE — Progress Notes (Signed)
"  ° °  Patient Name: Theresa Reynolds  DOB: [birth year] MRN: [id number]  Primary Cardiologist: None  Chart reviewed as part of pre-operative protocol coverage.   Patient called today for preoperative risk assessment. Based on our discussion, she did not meet functional capacity to safely proceed with surgery. Given her history of CAC score of 803, 95th percentile. Recommend in-office visit with general cardiology for further cardiac evaluation.   Dhyan Noah E Calee Nugent, NP 11/07/2024, 9:39 AM   "

## 2024-11-17 ENCOUNTER — Ambulatory Visit: Attending: Physician Assistant | Admitting: Physician Assistant

## 2024-11-17 ENCOUNTER — Encounter: Payer: Self-pay | Admitting: Physician Assistant

## 2024-11-17 VITALS — BP 106/58 | HR 63 | Ht 61.0 in | Wt 151.0 lb

## 2024-11-17 DIAGNOSIS — I351 Nonrheumatic aortic (valve) insufficiency: Secondary | ICD-10-CM | POA: Diagnosis not present

## 2024-11-17 DIAGNOSIS — Z01810 Encounter for preprocedural cardiovascular examination: Secondary | ICD-10-CM | POA: Diagnosis not present

## 2024-11-17 DIAGNOSIS — Z95818 Presence of other cardiac implants and grafts: Secondary | ICD-10-CM

## 2024-11-17 MED ORDER — ROSUVASTATIN CALCIUM 10 MG PO TABS: 10.0000 mg | ORAL_TABLET | Freq: Every day | ORAL | 3 refills | Status: AC

## 2024-11-17 NOTE — Patient Instructions (Addendum)
 Medication Instructions:  STOP Simvastatin   START Crestor  10mg  Take 1 tablet once a day  *If you need a refill on your cardiac medications before your next appointment, please call your pharmacy*  Lab Work: TODAY-BMET 3 MONTHS FASTING LIPIDS & LFTS (02/15/2025) If you have labs (blood work) drawn today and your tests are completely normal, you will receive your results only by: MyChart Message (if you have MyChart) OR A paper copy in the mail If you have any lab test that is abnormal or we need to change your treatment, we will call you to review the results.  Testing/Procedures: NONE ORDERED  Follow-Up: At [facility], you and your health needs are our priority.  As part of our continuing mission to provide you with exceptional heart care, our providers are all part of one team.  This team includes your primary Cardiologist (physician) and Advanced Practice Providers or APPs (Physician Assistants and Nurse Practitioners) who all work together to provide you with the care you need, when you need it.  Your next appointment:   6 month(s)  Provider:   Orren Fabry, PA  We recommend signing up for the patient portal called MyChart.  Sign up information is provided on this After Visit Summary.  MyChart is used to connect with patients for Virtual Visits (Telemedicine).  Patients are able to view lab/test results, encounter notes, upcoming appointments, etc.  Non-urgent messages can be sent to your provider as well.   To learn more about what you can do with MyChart, go to [email or web address].   Other Instructions

## 2024-11-17 NOTE — Progress Notes (Signed)
 " Cardiology Office Note   Date:  11/17/2024  ID:  Theresa Reynolds, DOB 01-Mar-1951, MRN [id number] PCP: Marvene Prentice SAUNDERS, FNP   HeartCare Providers Cardiologist:  None Electrophysiologist:  [name], MD (Inactive)   History of Present Illness Theresa Reynolds is a 74 y.o. female with a past medical history of atrial fibrillation, bilateral carotid stenosis, HTN, HLD, hepatitis C, nonobstructive CAD, OSA on CPAP, syncope status post ILR, tobacco use, and shingles here for follow-up appointment.  She last saw Reche Finder back in 2023.  She has been followed by EP intermittently since then.  She is doing well from a cardiac standpoint.  Metoprolol  was increased to 50 mg to suppress any further atrial fibrillation episodes and it was recommended that she come back in 6 months.  At that time she was seen for preop for a left knee scope.  She presents today for preop for right total hip arthroplasty.  She was transition to Plavix  since she had a history of GI bleed.  She is status post Watchman procedure.  Cannot stop Plavix  until after October 17, 2024, as she was to complete a full 6 month course of antiplatelet per note on 04/17/2024.  Today, she presents with a hx of afib with recent watchman placed here for preop clearance for right total hip replacement. She was originally followed by Dr. Burnard.  She is scheduled for a right total hip replacement and needs to hold Plavix  for five to seven days. She completed a six-month course of Plavix  on October 17, 2024. She is intermittently followed by electrophysiology for a loop recorder and Watchman device. She has brief palpitations but no sustained arrhythmias, chest pain, or shortness of breath.  She has been limited in activity by hip, sciatica, and knee pain but met the functional capacity thresholds on the DASI.  She takes simvastatin  20 mg daily for over 20 years. Her LDL has been in the 90s, her coronary calcium  score was  803, and triglycerides were 170.  She will complete lab work today in preparation for a CT scan at the end of the month to evaluate her Watchman device. A BMET is also ordered today.  Reports no shortness of breath nor dyspnea on exertion. Reports no chest pain, pressure, or tightness. No edema, orthopnea, PND.   Discussed the use of AI scribe software for clinical note transcription with the patient, who gave verbal consent to proceed.   ROS: Pertinent ROS in HPI  Studies Reviewed EKG Interpretation Date/Time:  Monday November 17 2024 13:56:07 EST Ventricular Rate:  62 PR Interval:  196 QRS Duration:  78 QT Interval:  426 QTC Calculation: 432 R Axis:   -8  Text Interpretation: Normal sinus rhythm Minimal voltage criteria for LVH, may be normal variant ( R in aVL ) When compared with ECG of 01-Feb-2024 08:01, Questionable change in QRS axis Confirmed by Lucien Blanc [phone]) on 11/17/2024 4:38:14 PM    CLINICAL DATA:  Atrial fibrillation s/p left atrial appendage closure.   EXAM: Cardiac CT/CTA   TECHNIQUE: A non-contrast, gated CT scan was obtained with axial slices of 2.5 mm through the heart for calcium  scoring. Calcium  scoring was performed using the Agatston method. A 120 kV prospective, gated, contrast cardiac scan was obtained. Gantry rotation speed was 230 msec and collimation was 0.63 mm. Nitroglycerin was not given. A delayed scan was obtained to exclude left atrial appendage thrombus. The 3D dataset was reconstructed in systole with motion correction. The  3D dataset was reconstructed at 5% intervals of the 30-45% of the R-R cycle. Images were analyzed on a dedicated workstation using MPR, MIP, and VRT modes. The patient received 95 cc of contrast.   FINDINGS: Image quality: Good.   Noise artifact is: Limited.   Left Atrium: The left atrial size is dilated. Possible tiny iatrogenic ASD. There is normal pulmonary vein drainage into the left atrium (2 on the right  and 2 on the left).   Left Atrial Appendage:   A 40 mm watchman FLX device has been implanted on 02/28/24. There is a small 3.5 mm peridevice leak on the superior aspect of the device. Contrast enters the LA appendage with no LA appendage thrombus. Device is otherwise well seated with device shoulders in the left atrium. No device related thrombus.   Coronary Arteries: Normal coronary origin. Right dominance. The study was performed without use of NTG and insufficient for plaque evaluation. Coronary artery calcium  score is 803, which is 95th percentile for age and sex matched peers.   Right Atrium: Right atrial size is dilated   Right Ventricle: The right ventricular cavity is within normal limits.   Left Ventricle: The ventricular cavity size is within normal limits. There are no stigmata of prior infarction. There is no abnormal filling defect.   Pulmonary Artery: Normal caliber without proximal filling defect.   Cardiac valves: Grossly normal, mild AV calcifications.   Aorta: Normal caliber, descending aortic atherosclerosis.   Pericardium: Normal thickness with no significant effusion or calcium  present.   Extra-cardiac findings: See attached radiology report for non-cardiac structures.   IMPRESSION: 1. A 40 mm watchman FLX device has been implanted on 02/28/24. There is a small 3.5 mm peridevice leak on the superior aspect of the device. Contrast enters the LA appendage with no LA appendage thrombus. Device is otherwise well seated with device shoulders in the left atrium and device slightly canted. No device related thrombus.   2. Coronary artery calcium  score is 803, which is 95th percentile for age and sex matched peers.   Electronically Signed: By: Soyla Merck M.D. On: 04/28/2024 13:12   Risk Assessment/Calculations  CHA2DS2-VASc Score = 4   This indicates a 4.8% annual risk of stroke. The patient's score is based upon: CHF History: 0 HTN History:  1 Diabetes History: 0 Stroke History: 0 Vascular Disease History: 1 Age Score: 1 Gender Score: 1       Physical Exam VS:  BP (!) 106/58   Pulse 63   Ht 5' 1 (1.549 m)   Wt 151 lb (68.5 kg)   SpO2 94%   BMI 28.53 kg/m        Wt Readings from Last 3 Encounters:  11/17/24 151 lb (68.5 kg)  08/27/24 149 lb (67.6 kg)  04/17/24 153 lb 9.6 oz (69.7 kg)    GEN: Well nourished, well developed in no acute distress NECK: No JVD; No carotid bruits CARDIAC: RRR, no murmurs, rubs, gallops RESPIRATORY:  Clear to auscultation without rales, wheezing or rhonchi  ABDOMEN: Soft, non-tender, non-distended EXTREMITIES:  No edema; No deformity   ASSESSMENT AND PLAN Preop Clearance  Ms. Mcmahen perioperative risk of a major cardiac event is 0.9% according to the Revised Cardiac Risk Index (RCRI).  Therefore, she is at low risk for perioperative complications.   Her functional capacity is good at 5.65 METs according to the [facility] Activity Status Index (DASI). Recommendations: According to ACC/AHA guidelines, no further cardiovascular testing needed.  The patient may proceed  to surgery at acceptable risk.   Antiplatelet and/or Anticoagulation Recommendations: Clopidogrel  (Plavix ) can be held for 5-7 days prior to her surgery and resumed as soon as possible post op.  Nonobstructive coronary artery disease with hyperlipidemia Elevated calcium  score of 803. LDL and triglycerides above goal. Previously on simvastatin  for over 20 years. Discussed risk modification, diet, and exercise post-hip replacement. - Changed to rosuvastatin  10 mg nightly. - Repeat lipid panel in 3 months.  Presence of Watchman left atrial appendage closure device Scheduled CT scan to evaluate device. - Ordered CT scan for end of month.      Dispo: She can follow-up in 6 months with me or MD  Signed, Orren LOISE Fabry, PA-C   "

## 2024-11-18 ENCOUNTER — Ambulatory Visit: Payer: Self-pay | Admitting: Physician Assistant

## 2024-11-18 ENCOUNTER — Ambulatory Visit: Payer: Self-pay | Admitting: Cardiology

## 2024-11-18 DIAGNOSIS — E785 Hyperlipidemia, unspecified: Secondary | ICD-10-CM

## 2024-11-18 LAB — BASIC METABOLIC PANEL WITH GFR
BUN/Creatinine Ratio: 18 (ref 12–28)
BUN: 13 mg/dL (ref 8–27)
CO2: 18 mmol/L — ABNORMAL LOW (ref 20–29)
Calcium: 9.3 mg/dL (ref 8.7–10.3)
Chloride: 103 mmol/L (ref 96–106)
Creatinine, Ser: 0.74 mg/dL (ref 0.57–1.00)
Glucose: 80 mg/dL (ref 70–99)
Potassium: 4.4 mmol/L (ref 3.5–5.2)
Sodium: 138 mmol/L (ref 134–144)
eGFR: 86 mL/min/1.73

## 2024-11-18 LAB — LIPID PANEL
Chol/HDL Ratio: 3.1 ratio (ref 0.0–4.4)
Cholesterol, Total: 174 mg/dL (ref 100–199)
HDL: 56 mg/dL
LDL Chol Calc (NIH): 76 mg/dL (ref 0–99)
Triglycerides: 258 mg/dL — ABNORMAL HIGH (ref 0–149)
VLDL Cholesterol Cal: 42 mg/dL — ABNORMAL HIGH (ref 5–40)

## 2024-11-18 LAB — HEPATIC FUNCTION PANEL
ALT: 10 IU/L (ref 0–32)
AST: 19 IU/L (ref 0–40)
Albumin: 4.3 g/dL (ref 3.8–4.8)
Alkaline Phosphatase: 82 IU/L (ref 49–135)
Bilirubin Total: 0.3 mg/dL (ref 0.0–1.2)
Bilirubin, Direct: 0.11 mg/dL (ref 0.00–0.40)
Total Protein: 7.2 g/dL (ref 6.0–8.5)

## 2024-11-22 ENCOUNTER — Encounter

## 2024-11-24 ENCOUNTER — Ambulatory Visit: Attending: Cardiology

## 2024-11-24 ENCOUNTER — Encounter

## 2024-11-24 DIAGNOSIS — I480 Paroxysmal atrial fibrillation: Secondary | ICD-10-CM

## 2024-11-25 LAB — CUP PACEART REMOTE DEVICE CHECK
Date Time Interrogation Session: 20260118230244
Implantable Pulse Generator Implant Date: 20220613

## 2024-11-28 NOTE — Progress Notes (Signed)
 Remote Loop Recorder Transmission

## 2024-11-30 ENCOUNTER — Ambulatory Visit: Payer: Self-pay | Admitting: Cardiology

## 2024-12-01 ENCOUNTER — Ambulatory Visit (HOSPITAL_COMMUNITY)

## 2024-12-03 NOTE — Progress Notes (Signed)
Pt. Needs orders for upcoming surgery. ?

## 2024-12-03 NOTE — Patient Instructions (Addendum)
 SURGICAL WAITING ROOM VISITATION Patients having surgery or a procedure may have no more than 2 support people in the waiting area - these visitors may rotate in the visitor waiting room.   Due to an increase in RSV and influenza rates and associated hospitalizations, children ages 66 and under may not visit patients in [facility] hospitals. If the patient needs to stay at the hospital during part of their recovery, the visitor guidelines for inpatient rooms apply.  PRE-OP VISITATION  Pre-op nurse will coordinate an appropriate time for 1 support person to accompany the patient in pre-op.  This support person may not rotate.  This visitor will be contacted when the time is appropriate for the visitor to come back in the pre-op area.  Please refer to the [facility] website for the visitor guidelines for Inpatients (after your surgery is over and you are in a regular room).  You are not required to quarantine at this time prior to your surgery. However, you must do this: Hand Hygiene often Do NOT share personal items Notify your provider if you are in close contact with someone who has COVID or you develop fever 100.4 or greater, new onset of sneezing, cough, sore throat, shortness of breath or body aches.  If you test positive for Covid or have been in contact with anyone that has tested positive in the last 10 days please notify you surgeon.    Your procedure is scheduled on: 12/17/24   Report to [facility] Main Entrance: [place] entrance where the [employer] is available.   Report to admitting at: 9:30 AM  Call this number if you have any questions or problems the morning of surgery [phone]  FOLLOW ANY ADDITIONAL PRE OP INSTRUCTIONS YOU RECEIVED FROM YOUR SURGEON'S OFFICE!!!  Do not eat food after Midnight the night prior to your surgery/procedure.  After Midnight you may have the following liquids until: 9:00 AM DAY OF SURGERY  Clear Liquid Diet Water Black  Coffee (sugar ok, NO MILK/CREAM OR CREAMERS)  Tea (sugar ok, NO MILK/CREAM OR CREAMERS) regular and decaf                             Plain Jell-O  with no fruit (NO RED)                                           Fruit ices (not with fruit pulp, NO RED)                                     Popsicles (NO RED)                                                                  Juice: NO CITRUS JUICES: only apple, WHITE grape, WHITE cranberry Sports drinks like Gatorade or Powerade (NO RED)    Oral Hygiene is also important to reduce your risk of infection.        Remember - BRUSH YOUR TEETH THE MORNING OF SURGERY WITH YOUR REGULAR  TOOTHPASTE  Do NOT smoke after Midnight the night before surgery.  STOP TAKING all Vitamins, Herbs and supplements 1 week before your surgery.   Take ONLY these medicines the morning of surgery with A SIP OF WATER: sertraline ,Metoprolol (Toprol ).   Before surgery.Stop taking __Plavix__on __2/3/26___as instructed by : Orren Fabry PA-C_.  Stop taking ____________as directed by your Surgeon/Cardiologist.  Contact your Surgeon/Cardiologist for instructions on Anticoagulant Therapy prior to surgery.  If You have been diagnosed with Sleep Apnea - Bring CPAP mask and tubing day of surgery. We will provide you with a CPAP machine on the day of your surgery.                   You may not have any metal on your body including hair pins, jewelry, and body piercing  Do not wear make-up, lotions, powders, perfumes / cologne, or deodorant  Do not wear nail polish including gel and S&S, artificial / acrylic nails, or any other type of covering on natural nails including finger and toenails. If you have artificial nails, gel coating, etc., that needs to be removed by a nail salon, Please have this removed prior to surgery. Not doing so may mean that your surgery could be cancelled or delayed if the Surgeon or anesthesia staff feels like they are unable to monitor you safely.   Do  not shave 48 hours prior to surgery to avoid nicks in your skin which may contribute to postoperative infections.   Contacts, Hearing Aids, dentures or bridgework may not be worn into surgery. DENTURES WILL BE REMOVED PRIOR TO SURGERY PLEASE DO NOT APPLY Poly grip OR ADHESIVES!!!  You may bring a small overnight bag with you on the day of surgery, only pack items that are not valuable. [facility] IS NOT RESPONSIBLE   FOR VALUABLES THAT ARE LOST OR STOLEN.   Patients discharged on the day of surgery will not be allowed to drive home.  Someone NEEDS to stay with you for the first 24 hours after anesthesia.  Do not bring your home medications to the hospital. The Pharmacy will dispense medications listed on your medication list to you during your admission in the Hospital.  Special Instructions: Bring a copy of your healthcare power of attorney and living will documents the day of surgery, if you wish to have them scanned into your [facility] Records- EPIC  Please read over the following fact sheets you were given: IF YOU HAVE QUESTIONS ABOUT YOUR PRE-OP INSTRUCTIONS, PLEASE CALL [phone]  PATIENT SIGNATURE_________________________________  NURSE SIGNATURE__________________________________  ________________________________________________________________________  Pre-operative 4 CHG Bath Instructions  DYNA-Hex 4 Chlorhexidine  Gluconate 4% Solution Antiseptic 4 fl. oz   You can play a key role in reducing the risk of infection after surgery. Your skin needs to be as free of germs as possible. You can reduce the number of germs on your skin by washing with CHG (chlorhexidine  gluconate) soap before surgery. CHG is an antiseptic soap that kills germs and continues to kill germs even after washing.   DO NOT use if you have an allergy to chlorhexidine /CHG or antibacterial soaps. If your skin becomes reddened or irritated, stop using the CHG and notify one of our RNs at   Please  shower with the CHG soap starting 4 days before surgery using the following schedule:     Please keep in mind the following:  DO NOT shave, including legs and underarms, starting the day of your first shower.   You may shave your  face at any point before/day of surgery.  Place clean sheets on your bed the day you start using CHG soap. Use a clean washcloth (not used since being washed) for each shower. DO NOT sleep with pets once you start using the CHG.  CHG Shower Instructions:  If you choose to wash your hair and private area, wash first with your normal shampoo/soap.  After you use shampoo/soap, rinse your hair and body thoroughly to remove shampoo/soap residue.  Turn the water OFF and apply about 3 tablespoons (45 ml) of CHG soap to a CLEAN washcloth.  Apply CHG soap ONLY FROM YOUR NECK DOWN TO YOUR TOES (washing for 3-5 minutes)  DO NOT use CHG soap on face, private areas, open wounds, or sores.  Pay special attention to the area where your surgery is being performed.  If you are having back surgery, having someone wash your back for you may be helpful. Wait 2 minutes after CHG soap is applied, then you may rinse off the CHG soap.  Pat dry with a clean towel  Put on clean clothes/pajamas   If you choose to wear lotion, please use ONLY the CHG-compatible lotions on the back of this paper.     Additional instructions for the day of surgery: DO NOT APPLY any lotions, deodorants, cologne, or perfumes.   Put on clean/comfortable clothes.  Brush your teeth.  Ask your nurse before applying any prescription medications to the skin.   CHG Compatible Lotions   Aveeno Moisturizing lotion  Cetaphil Moisturizing Cream  Cetaphil Moisturizing Lotion  Clairol Herbal Essence Moisturizing Lotion, Dry Skin  Clairol Herbal Essence Moisturizing Lotion, Extra Dry Skin  Clairol Herbal Essence Moisturizing Lotion, Normal Skin  Curel Age Defying Therapeutic Moisturizing Lotion with Alpha Hydroxy   Curel Extreme Care Body Lotion  Curel Soothing Hands Moisturizing Hand Lotion  Curel Therapeutic Moisturizing Cream, Fragrance-Free  Curel Therapeutic Moisturizing Lotion, Fragrance-Free  Curel Therapeutic Moisturizing Lotion, Original Formula  Eucerin Daily Replenishing Lotion  Eucerin Dry Skin Therapy Plus Alpha Hydroxy Crme  Eucerin Dry Skin Therapy Plus Alpha Hydroxy Lotion  Eucerin Original Crme  Eucerin Original Lotion  Eucerin Plus Crme Eucerin Plus Lotion  Eucerin TriLipid Replenishing Lotion  Keri Anti-Bacterial Hand Lotion  Keri Deep Conditioning Original Lotion Dry Skin Formula Softly Scented  Keri Deep Conditioning Original Lotion, Fragrance Free Sensitive Skin Formula  Keri Lotion Fast Absorbing Fragrance Free Sensitive Skin Formula  Keri Lotion Fast Absorbing Softly Scented Dry Skin Formula  Keri Original Lotion  Keri Skin Renewal Lotion Keri Silky Smooth Lotion  Keri Silky Smooth Sensitive Skin Lotion  Nivea Body Creamy Conditioning Oil  Nivea Body Extra Enriched Teacher, Adult Education Moisturizing Lotion Nivea Crme  Nivea Skin Firming Lotion  NutraDerm 30 Skin Lotion  NutraDerm Skin Lotion  NutraDerm Therapeutic Skin Cream  NutraDerm Therapeutic Skin Lotion  ProShield Protective Hand Cream  Provon moisturizing lotion  ________________________________________________________________________

## 2024-12-04 NOTE — Progress Notes (Signed)
 Sent message, via epic in basket, requesting orders in epic from Careers adviser.

## 2024-12-05 ENCOUNTER — Ambulatory Visit (HOSPITAL_COMMUNITY)
Admission: RE | Admit: 2024-12-05 | Discharge: 2024-12-05 | Disposition: A | Source: Ambulatory Visit | Attending: Cardiovascular Disease | Admitting: Cardiovascular Disease

## 2024-12-05 DIAGNOSIS — Z95818 Presence of other cardiac implants and grafts: Secondary | ICD-10-CM | POA: Insufficient documentation

## 2024-12-05 DIAGNOSIS — I480 Paroxysmal atrial fibrillation: Secondary | ICD-10-CM | POA: Insufficient documentation

## 2024-12-05 MED ORDER — IOHEXOL 350 MG/ML SOLN
80.0000 mL | Freq: Once | INTRAVENOUS | Status: AC | PRN
  Administered 2024-12-05: 80 mL via INTRAVENOUS

## 2024-12-08 ENCOUNTER — Other Ambulatory Visit: Payer: Self-pay

## 2024-12-08 ENCOUNTER — Ambulatory Visit: Payer: Self-pay | Admitting: Cardiovascular Disease

## 2024-12-08 ENCOUNTER — Encounter (HOSPITAL_COMMUNITY): Admission: RE | Admit: 2024-12-08

## 2024-12-08 ENCOUNTER — Encounter (HOSPITAL_COMMUNITY): Payer: Self-pay

## 2024-12-08 VITALS — Ht 61.0 in | Wt 150.0 lb

## 2024-12-08 DIAGNOSIS — Z01818 Encounter for other preprocedural examination: Secondary | ICD-10-CM

## 2024-12-08 DIAGNOSIS — I10 Essential (primary) hypertension: Secondary | ICD-10-CM

## 2024-12-08 HISTORY — DX: Cardiac arrhythmia, unspecified: I49.9

## 2024-12-08 HISTORY — DX: Cardiac murmur, unspecified: R01.1

## 2024-12-08 HISTORY — DX: Anemia, unspecified: D64.9

## 2024-12-08 HISTORY — DX: Unspecified osteoarthritis, unspecified site: M19.90

## 2024-12-08 NOTE — Progress Notes (Signed)
 For Anesthesia: PCP - Marvene Prentice SAUNDERS, FNP  Cardiologist - NONE Clearance: Lucien Orren SAILOR, PA-C : 11/17/24 Bowel Prep reminder:N/A  Chest x-ray - 11/17/24  CT cardiac: 04/29/24 EKG - 11/17/24 Stress Test -  ECHO - 02/28/24 Cardiac Cath -  Pacemaker/ICD device last checked: Pacemaker orders received: Device Rep notified:  Spinal Cord Stimulator: N/  Sleep Study - Yes CPAP - Yes  Fasting Blood Sugar - N/A Checks Blood Sugar _____ times a day Date and result of last Hgb A1c-  Last dose of GLP1 agonist- N/A GLP1 instructions: Hold 7 days prior to schedule (Hold 24 hours-daily)   Last dose of SGLT-2 inhibitors- N/A SGLT-2 instructions: Hold 72 hours prior to surgery  Blood Thinner Instructions:Plavix  will be hold after: 12/09/24 Last Dose: Time last taken:  Aspirin Instructions:N/A Last Dose: Time last taken:  Activity level:  Unable to go up a flight of stairs due to hip pain,denied any SOB.    Anesthesia review: Hx: HTN,Afib,Loop recorder,watchman device implanted: 02/28/24,OSA(CPAP).  Patient denies shortness of breath, fever, cough and chest pain at PAT appointment   Patient verbalized understanding of instructions that were reviewed over the telephone.

## 2024-12-11 ENCOUNTER — Encounter (HOSPITAL_COMMUNITY): Admission: RE | Admit: 2024-12-11 | Discharge: 2024-12-11 | Attending: Orthopedic Surgery

## 2024-12-11 DIAGNOSIS — I10 Essential (primary) hypertension: Secondary | ICD-10-CM

## 2024-12-11 DIAGNOSIS — Z01818 Encounter for other preprocedural examination: Secondary | ICD-10-CM

## 2024-12-11 LAB — TYPE AND SCREEN
ABO/RH(D): O NEG
Antibody Screen: NEGATIVE

## 2024-12-11 LAB — SURGICAL PCR SCREEN
MRSA, PCR: NEGATIVE
Staphylococcus aureus: NEGATIVE

## 2024-12-11 LAB — BASIC METABOLIC PANEL WITH GFR
Anion gap: 11 (ref 5–15)
BUN: 14 mg/dL (ref 8–23)
CO2: 25 mmol/L (ref 22–32)
Calcium: 9.6 mg/dL (ref 8.9–10.3)
Chloride: 101 mmol/L (ref 98–111)
Creatinine, Ser: 0.77 mg/dL (ref 0.44–1.00)
GFR, Estimated: >60 mL/min
Glucose, Bld: 103 mg/dL — ABNORMAL HIGH (ref 70–99)
Potassium: 4.5 mmol/L (ref 3.5–5.1)
Sodium: 137 mmol/L (ref 135–145)

## 2024-12-11 LAB — CBC
HCT: 41 % (ref 36.0–46.0)
Hemoglobin: 13.2 g/dL (ref 12.0–15.0)
MCH: 32.4 pg (ref 26.0–34.0)
MCHC: 32.2 g/dL (ref 30.0–36.0)
MCV: 100.7 fL — ABNORMAL HIGH (ref 80.0–100.0)
Platelets: 274 10*3/uL (ref 150–400)
RBC: 4.07 MIL/uL (ref 3.87–5.11)
RDW: 13.6 % (ref 11.5–15.5)
WBC: 8 10*3/uL (ref 4.0–10.5)
nRBC: 0 % (ref 0.0–0.2)

## 2024-12-12 ENCOUNTER — Encounter (HOSPITAL_COMMUNITY): Payer: Self-pay

## 2024-12-12 ENCOUNTER — Ambulatory Visit: Payer: Self-pay | Admitting: Student

## 2024-12-12 NOTE — Anesthesia Preprocedure Evaluation (Signed)
"                                    Anesthesia Evaluation    Airway        Dental   Pulmonary Current Smoker          Cardiovascular hypertension,      Neuro/Psych    GI/Hepatic   Endo/Other    Renal/GU      Musculoskeletal   Abdominal   Peds  Hematology   Anesthesia Other Findings   Reproductive/Obstetrics                              Anesthesia Physical Anesthesia Plan  ASA:   Anesthesia Plan:    Post-op Pain Management:    Induction:   PONV Risk Score and Plan:   Airway Management Planned:   Additional Equipment:   Intra-op Plan:   Post-operative Plan:   Informed Consent:   Plan Discussed with:   Anesthesia Plan Comments: (See PAT note from 2/2)         Anesthesia Quick Evaluation  "

## 2024-12-12 NOTE — H&P (Cosign Needed)
 TOTAL HIP ADMISSION H&P  Patient is admitted for right total hip arthroplasty.  Subjective:  Chief Complaint: right hip pain  HPI: Theresa Reynolds, 74 y.o. female, has a history of pain and functional disability in the right hip(s) due to arthritis and patient has failed non-surgical conservative treatments for greater than 12 weeks to include NSAID's and/or analgesics, flexibility and strengthening excercises, use of assistive devices, and activity modification.  Onset of symptoms was gradual starting 10 years ago with rapidlly worsening course since that time.The patient noted no past surgery on the right hip(s).  Patient currently rates pain in the right hip at 10 out of 10 with activity. Patient has night pain, worsening of pain with activity and weight bearing, trendelenberg gait, pain that interfers with activities of daily living, pain with passive range of motion, and joint swelling. Patient has evidence of subchondral cysts, subchondral sclerosis, periarticular osteophytes, joint space narrowing, and subchondral insufficiency fracture, large effusion by imaging studies. This condition presents safety issues increasing the risk of falls.  There is no current active infection.  Patient Active Problem List   Diagnosis Date Noted   Esophagus tear 03/07/2024   Melena 03/06/2024   Acute GI bleeding 03/05/2024   ABLA (acute blood loss anemia) 03/05/2024   Diverticulitis 03/05/2024   Depression with anxiety 03/05/2024   Atrial fibrillation (HCC) 02/28/2024   Essential hypertension 08/30/2022   Hyperlipidemia 08/30/2022   Hepatitis C test positive 08/30/2022   OSA (obstructive sleep apnea) 08/30/2022   Tobacco use 08/30/2022   History of shingles 08/30/2022   Syncope 08/30/2022   Bilateral carotid artery stenosis 08/30/2022   Tear of medial meniscus of knee 06/28/2022   Pain in joint of left knee 06/08/2022   Acquired trigger finger of right ring finger 06/30/2019   Bilateral hand pain  01/30/2018   Carpal tunnel syndrome of right wrist 01/30/2018   Paroxysmal atrial fibrillation (HCC) 06/05/2017   Past Medical History:  Diagnosis Date   Anemia    Anxiety and depression    s/p suicide attempt 11/11   Aortic insufficiency    mild by echo 10/17   Arthritis    Atrial fibrillation (HCC)    Carotid stenosis, bilateral    0-39% by US    Colitis    Dysrhythmia    Heart murmur    Hepatitis C    Followed by Dr. Prescilla at [facility].  s/p treatment with Bavoni.   History of shingles    HTN (hypertension)    Hypercholesteremia    Hypersomnia    OSA (obstructive sleep apnea)    Right shoulder pain    Seasonal allergies    Syncope and collapse    Tobacco use    Vitamin D deficiency     Past Surgical History:  Procedure Laterality Date   ATRIAL FIBRILLATION ABLATION N/A 06/05/2017   Procedure: Atrial Fibrillation Ablation;  Surgeon: Kelsie Agent, MD;  Location: [facility];  Service: Cardiovascular;  Laterality: N/A;   BREAST REDUCTION SURGERY  10/07/1999   With Dr. Marcus   CESAREAN SECTION     x 1   ESOPHAGOGASTRODUODENOSCOPY N/A 03/07/2024   Procedure: EGD (ESOPHAGOGASTRODUODENOSCOPY);  Surgeon: Albertus Gordy CHRISTELLA, MD;  Location: [facility] ENDOSCOPY;  Service: Gastroenterology;  Laterality: N/A;   LEFT ATRIAL APPENDAGE OCCLUSION N/A 02/28/2024   Procedure: LEFT ATRIAL APPENDAGE OCCLUSION;  Surgeon: Cindie Ole DASEN, MD;  Location: [facility];  Service: Cardiovascular;  Laterality: N/A;   LOOP RECORDER INSERTION N/A 05/07/2017  Procedure: Loop Recorder Insertion;  Surgeon: Kelsie Agent, MD;  Location: [facility];  Service: Cardiovascular;  Laterality: N/A;   LOOP RECORDER INSERTION  04/18/2021   removal of previously implanted OPWV8 with subsequent placement of a Medtronic Reveal Linq model LNQ 2 implantable loop recorder  (SN# [id number])   MENISCUS REPAIR Left 2023   TRANSESOPHAGEAL ECHOCARDIOGRAM (CATH LAB) N/A 02/28/2024    Procedure: TRANSESOPHAGEAL ECHOCARDIOGRAM;  Surgeon: Cindie Ole DASEN, MD;  Location: [facility] INVASIVE CV LAB;  Service: Cardiovascular;  Laterality: N/A;    Current Outpatient Medications  Medication Sig Dispense Refill Last Dose/Taking   amoxicillin  (AMOXIL ) 500 MG capsule TAKE 4 CAPSULES BY MOUTH 1 HOUR PRIOR TO DENTAL PROCEDURES AS NEEDED 4 capsule 1    carboxymethylcellulose 1 % ophthalmic solution Place 1 drop into both eyes 2 (two) times daily as needed (dry/irrtated eyes).      clopidogrel  (PLAVIX ) 75 MG tablet TAKE 1 TABLET BY MOUTH EVERY DAY 30 tablet 10    HYDROcodone -acetaminophen  (NORCO) 10-325 MG tablet Take 1 tablet by mouth 3 (three) times daily as needed for severe pain (pain score 7-10).      LORazepam  (ATIVAN ) 1 MG tablet Take 0.5-1 mg by mouth 2 (two) times daily as needed for anxiety.      metoprolol  succinate (TOPROL -XL) 50 MG 24 hr tablet TAKE 1 TABLET BY MOUTH EVERY DAY WITH OR IMMEDIATELY FOLLOWING A MEAL 90 tablet 3    NON FORMULARY Pt uses cpap nightly      pantoprazole  (PROTONIX ) 40 MG tablet Take 1 tablet (40 mg total) by mouth 2 (two) times daily before a meal. (Patient not taking: Reported on 12/03/2024) 60 tablet 0    rosuvastatin  (CRESTOR ) 10 MG tablet Take 1 tablet (10 mg total) by mouth daily. 90 tablet 3    sertraline  (ZOLOFT ) 100 MG tablet Take 150 mg by mouth daily.  0    sodium chloride  (OCEAN) 0.65 % SOLN nasal spray Place 1 spray into both nostrils as needed for congestion.      traZODone  (DESYREL ) 100 MG tablet Take 100 mg by mouth at bedtime.      No current facility-administered medications for this visit.   Allergies[1]  Social History   Tobacco Use   Smoking status: Every Day    Current packs/day: 1.00    Average packs/day: 1 pack/day for 30.0 years (30.0 ttl pk-yrs)    Types: Cigarettes   Smokeless tobacco: Never  Substance Use Topics   Alcohol  use: Yes    Alcohol /week: 6.0 standard drinks of alcohol     Types: 6 Glasses of wine per week     Comment: occas.    Family History  Problem Relation Age of Onset   Atrial fibrillation Mother    Mitral valve prolapse Mother    Atrial fibrillation Brother    Heart disease Maternal Grandfather      Review of Systems  Musculoskeletal:  Positive for arthralgias and gait problem.  All other systems reviewed and are negative.   Objective:  Physical Exam Constitutional:      Appearance: Normal appearance.  HENT:     Head: Normocephalic and atraumatic.     Nose: Nose normal.     Mouth/Throat:     Mouth: Mucous membranes are moist.     Pharynx: Oropharynx is clear.  Eyes:     Conjunctiva/sclera: Conjunctivae normal.  Cardiovascular:     Rate and Rhythm: Normal rate and regular rhythm.     Pulses: Normal pulses.  Heart sounds: Normal heart sounds.  Pulmonary:     Effort: Pulmonary effort is normal.     Breath sounds: Normal breath sounds.  Abdominal:     General: Abdomen is flat.     Palpations: Abdomen is soft.  Genitourinary:    Comments: Deferred.  Musculoskeletal:     Cervical back: Normal range of motion and neck supple.     Comments: Examination of the right hip reveals no skin wounds or lesions. Mild trochanteric tenderness to palpation. She has restricted range of motion of the hip. Severe pain with manipulation of the hip. Pain in the position of impingement. Pain with terminal flexion and rotation.  She ambulates with an antalgic gait.  She is neurovascularly intact.  Skin:    General: Skin is warm and dry.     Capillary Refill: Capillary refill takes less than 2 seconds.  Neurological:     General: No focal deficit present.     Mental Status: She is alert and oriented to person, place, and time.  Psychiatric:        Mood and Affect: Mood normal.        Behavior: Behavior normal.        Thought Content: Thought content normal.        Judgment: Judgment normal.     Vital signs in last 24 hours: @VSRANGES @  Labs:   Estimated body mass index is  28.58 kg/m as calculated from the following:   Height as of 12/11/24: 5' 0.75 (1.543 m).   Weight as of 12/11/24: 68 kg.   Imaging Review Plain radiographs demonstrate severe degenerative joint disease of the right hip(s). The bone quality appears to be adequate for age and reported activity level.      Assessment/Plan:  End stage arthritis, right hip(s)  The patient history, physical examination, clinical judgement of the provider and imaging studies are consistent with end stage degenerative joint disease of the right hip(s) and total hip arthroplasty is deemed medically necessary. The treatment options including medical management, injection therapy, arthroscopy and arthroplasty were discussed at length. The risks and benefits of total hip arthroplasty were presented and reviewed. The risks due to aseptic loosening, infection, stiffness, dislocation/subluxation,  thromboembolic complications and other imponderables were discussed.  The patient acknowledged the explanation, agreed to proceed with the plan and consent was signed. Patient is being admitted for inpatient treatment for surgery, pain control, PT, OT, prophylactic antibiotics, VTE prophylaxis, progressive ambulation and ADL's and discharge planning.The patient is planning to be discharged home with HEP after an overnight stay.   Therapy Plans: HEP.  Disposition: Home with boyfriend Planned DVT Prophylaxis: aspirin 81mg  BID and plavix  per baseline.  DME needed: Has rolling walker.  PCP: Cleared.  Cardiology: Cleared. Okay to hold plavix  for 7 days preoperative, resume as soon as possible.  TXA: IV Allergies:  - Duloxetine - patient doesn't know.  - Ibuprofen - can't take due to blood thinners.  - Venlafaxine - patient doesn't know.  Anesthesia Concerns: None.  BMI: 28.6 Last HgbA1c: 5.7 Other: - CAD, A-fib chronic plavix . Hold plavix  7 days prior to surgery. S/p watchman placement. - Hx GI bleed, no Eliquis .  - Chronic  lumbar pain with radiculopathy LLE. Has been getting hydrocodone  Dr. Bonner, not in pain management per patient. Discussed to try and not take prior to surgery for pain control PO.  - Staples** 15/day.  - Hydrocodone , zofran , no NSAIDs due to GI bleed.  - 12/11/24: Hgb 13.2, Cr. 0.77,  K+ 4.5.     Patient's anticipated LOS is less than 2 midnights, meeting these requirements: - Younger than 24 - Lives within 1 hour of care - Has a competent adult at home to recover with post-op recover - NO history of  - Chronic pain requiring opiods  - Diabetes  - Coronary Artery Disease  - Heart failure  - Heart attack  - Stroke  - DVT/VTE  - Cardiac arrhythmia  - Respiratory Failure/COPD  - Renal failure  - Anemia  - Advanced Liver disease      [1]  Allergies Allergen Reactions   Cymbalta [Duloxetine Hcl] Other (See Comments)    Dizzy   Duloxetine     Other Reaction(s): Not available   Ibuprofen     Other Reaction(s): Not available, tdue to being on Elliquis

## 2024-12-12 NOTE — Progress Notes (Signed)
 " Case: [id number] Date/Time: 12/17/24 1143   Procedure: ARTHROPLASTY, HIP, TOTAL, ANTERIOR APPROACH (Right: Hip)   Anesthesia type: Spinal   Diagnosis: Primary osteoarthritis of right hip [M16.11]   Pre-op diagnosis: Right hip osteoarthritis   Location: [facility] ROOM 07 / [facility]   Surgeons: Fidel Rogue, MD       DISCUSSION: Theresa Reynolds is a 74 yo female with PMH of current smoking, HTN, nonobstructive CAD (by CT), atrial fibrillation s/p ablation (2018) and watchman implant (02/2024), s/p ILR, hx of syncope, moderate AI, mild AS, OSA on CPAP, history of hep C s/p treatment, anxiety, depression, arthritis, PONV.  Patient follows with cardiology for A-fib.  She had an ablation in 2018 and had ILR implanted at that time due to associated syncope and dizziness. Patient started to have nosebleeds with Eliquis  and underwent Watchman implant in 02/2024. Last seen by Dr. Cindie with EP on 08/27/24. She has a peridevice leak identified on 04/2024 CT. Follow up CT on 12/05/24 showed the same. She was seen on 11/17/24 by PA Lucien for pre op clearance. She denied any symptoms. Cleared for surgery:  Preop Clearance   Ms. Meche perioperative risk of a major cardiac event is 0.9% according to the Revised Cardiac Risk Index (RCRI).  Therefore, she is at low risk for perioperative complications.   Her functional capacity is good at 5.65 METs according to the Duke Activity Status Index (DASI). Recommendations: According to ACC/AHA guidelines, no further cardiovascular testing needed.  The patient may proceed to surgery at acceptable risk.   Antiplatelet and/or Anticoagulation Recommendations: Clopidogrel  (Plavix ) can be held for 5-7 days prior to her surgery and resumed as soon as possible post op.  LD Plavix : 2/3    VS: Ht 5' 1 (1.549 m)   Wt 68 kg   BMI 28.34 kg/m   PROVIDERS: Marvene Prentice SAUNDERS, FNP   LABS: Labs reviewed: Acceptable for surgery. (all labs ordered are listed, but only abnormal  results are displayed)  Labs Reviewed - No data to display   CT Cardiac 12/05/24:  IMPRESSION: 1. A 40 mm Watchman FLX device has been implanted on 02/28/24. There is an unchanged 3.5 mm peridevice leak on the superior aspect of the device, similar compared to CT from 04/25/24. Contrast enters the LA appendage, no LA appendage thrombus. Device is well seated in left atrial appendage with device shoulders in the left atrium. No device related thrombus.  EKG 11/17/24:  Normal sinus rhythm Minimum voltage criteria for LVH, may be normal variant   Device check 11/23/24:  ILR summary report received. Battery status OK. Normal device function. No new symptom, tachy, brady, or pause episodes. No new AF episodes. Monthly summary reports and ROV/PRN 1 false pause c/w undersensing  TEE 02/28/24:  IMPRESSIONS    1. Large chicken wing appendage No thrombus. 40 mm FLX device somewhat canted position with small initial butter along coumadin ridge. Negative tugg test Color flow with no leak past fabric after device warmed and released. Average compression 26%.  2. Left ventricular ejection fraction, by estimation, is 55 to 60%. The left ventricle has normal function.  3. Right ventricular systolic function is normal. The right ventricular size is normal.  4. Left atrial size was mildly dilated. No left atrial/left atrial appendage thrombus was detected.  5. The mitral valve is abnormal. Mild mitral valve regurgitation.  6. The aortic valve is tricuspid. There is mild calcification of the aortic valve. There is mild thickening of the aortic valve. Aortic  valve regurgitation is moderate. Mild aortic valve stenosis.  7. 3D performed of the LAA and demonstrates Extensive 3 D image done to measure device compression.  8. Only left to right shunt post trans septal puncture.  Echo 01/07/24:  IMPRESSIONS    1. Left ventricular ejection fraction, by estimation, is 60 to 65%. Left ventricular  ejection fraction by 3D volume is 56 %. The left ventricle has normal function. The left ventricle has no regional wall motion abnormalities. Left ventricular diastolic  parameters were normal. The average left ventricular global longitudinal strain is -22.8 %. The global longitudinal strain is normal.  2. Right ventricular systolic function is normal. The right ventricular size is normal. There is normal pulmonary artery systolic pressure.  3. The mitral valve is normal in structure. Trivial mitral valve regurgitation. No evidence of mitral stenosis.  4. There is a wide-based jet of central AI. AI PHT . Likely moderate AI. The aortic valve is tricuspid. There is mild calcification of the aortic valve. Aortic valve regurgitation is moderate. No aortic stenosis is present.  5. The inferior vena cava is normal in size with greater than 50% respiratory variability, suggesting right atrial pressure of 3 mmHg.  Conclusion(s)/Recommendation(s): Consider TEE to further evalaute AI.  Past Medical History:  Diagnosis Date   Anemia    Anxiety and depression    s/p suicide attempt 11/11   Aortic insufficiency    mild by echo 10/17   Arthritis    Atrial fibrillation (HCC)    Carotid stenosis, bilateral    0-39% by US    Colitis    Dysrhythmia    Heart murmur    Hepatitis C    Followed by Dr. Prescilla at [facility].  s/p treatment with Bavoni.   History of shingles    HTN (hypertension)    Hypercholesteremia    Hypersomnia    OSA (obstructive sleep apnea)    Right shoulder pain    Seasonal allergies    Syncope and collapse    Tobacco use    Vitamin D deficiency     Past Surgical History:  Procedure Laterality Date   ATRIAL FIBRILLATION ABLATION N/A 06/05/2017   Procedure: Atrial Fibrillation Ablation;  Surgeon: Kelsie Agent, MD;  Location: [facility];  Service: Cardiovascular;  Laterality: N/A;   BREAST REDUCTION SURGERY  10/07/1999   With Dr. Marcus    CESAREAN SECTION     x 1   ESOPHAGOGASTRODUODENOSCOPY N/A 03/07/2024   Procedure: EGD (ESOPHAGOGASTRODUODENOSCOPY);  Surgeon: Albertus Gordy HERO, MD;  Location: [facility] ENDOSCOPY;  Service: Gastroenterology;  Laterality: N/A;   LEFT ATRIAL APPENDAGE OCCLUSION N/A 02/28/2024   Procedure: LEFT ATRIAL APPENDAGE OCCLUSION;  Surgeon: Cindie Ole DASEN, MD;  Location: [facility];  Service: Cardiovascular;  Laterality: N/A;   LOOP RECORDER INSERTION N/A 05/07/2017   Procedure: Loop Recorder Insertion;  Surgeon: Kelsie Agent, MD;  Location: [facility];  Service: Cardiovascular;  Laterality: N/A;   LOOP RECORDER INSERTION  04/18/2021   removal of previously implanted OPWV8 with subsequent placement of a Medtronic Reveal Linq model LNQ 2 implantable loop recorder  (SN# [id number])   MENISCUS REPAIR Left 2023   TRANSESOPHAGEAL ECHOCARDIOGRAM (CATH LAB) N/A 02/28/2024   Procedure: TRANSESOPHAGEAL ECHOCARDIOGRAM;  Surgeon: Cindie Ole DASEN, MD;  Location: [facility] INVASIVE CV LAB;  Service: Cardiovascular;  Laterality: N/A;    MEDICATIONS:  amoxicillin  (AMOXIL ) 500 MG capsule   carboxymethylcellulose 1 % ophthalmic solution   clopidogrel  (PLAVIX ) 75 MG  tablet   HYDROcodone -acetaminophen  (NORCO) 10-325 MG tablet   LORazepam  (ATIVAN ) 1 MG tablet   metoprolol  succinate (TOPROL -XL) 50 MG 24 hr tablet   NON FORMULARY   pantoprazole  (PROTONIX ) 40 MG tablet   rosuvastatin  (CRESTOR ) 10 MG tablet   sertraline  (ZOLOFT ) 100 MG tablet   sodium chloride  (OCEAN) 0.65 % SOLN nasal spray   traZODone  (DESYREL ) 100 MG tablet   No current facility-administered medications for this encounter.   Burnard CHRISTELLA Odis DEVONNA [facility] Pre-Surgical Testing [facility] Phone [phone] 12/12/2024 11:52 AM        "

## 2024-12-17 ENCOUNTER — Ambulatory Visit (HOSPITAL_COMMUNITY): Admission: RE | Admit: 2024-12-17 | Source: Home / Self Care | Admitting: Orthopedic Surgery

## 2024-12-17 ENCOUNTER — Encounter (HOSPITAL_COMMUNITY): Admission: RE | Payer: Self-pay | Source: Home / Self Care

## 2024-12-17 ENCOUNTER — Encounter (HOSPITAL_COMMUNITY): Payer: Self-pay | Admitting: Medical

## 2024-12-17 SURGERY — ARTHROPLASTY, HIP, TOTAL, ANTERIOR APPROACH
Anesthesia: Spinal | Site: Hip | Laterality: Right

## 2024-12-23 ENCOUNTER — Encounter

## 2024-12-25 ENCOUNTER — Ambulatory Visit

## 2025-01-23 ENCOUNTER — Encounter

## 2025-01-25 ENCOUNTER — Ambulatory Visit
